# Patient Record
Sex: Female | Born: 1935 | Race: Black or African American | Hispanic: No
Health system: Southern US, Community
[De-identification: ages and names within clinical notes are randomized; demographics above are authoritative.]

## PROBLEM LIST (undated history)

## (undated) DIAGNOSIS — I10 Essential (primary) hypertension: Secondary | ICD-10-CM

## (undated) DIAGNOSIS — I219 Acute myocardial infarction, unspecified: Secondary | ICD-10-CM

## (undated) DIAGNOSIS — F0280 Dementia in other diseases classified elsewhere without behavioral disturbance: Secondary | ICD-10-CM

## (undated) DIAGNOSIS — E119 Type 2 diabetes mellitus without complications: Secondary | ICD-10-CM

## (undated) DIAGNOSIS — R42 Dizziness and giddiness: Secondary | ICD-10-CM

## (undated) HISTORY — PX: VEIN LIGATION: SHX2652

## (undated) HISTORY — PX: ABDOMINAL HYSTERECTOMY: SHX81

---

## 2004-10-08 ENCOUNTER — Ambulatory Visit: Payer: Self-pay | Admitting: Family Medicine

## 2006-01-05 ENCOUNTER — Ambulatory Visit: Payer: Self-pay | Admitting: Family Medicine

## 2007-06-21 ENCOUNTER — Ambulatory Visit: Payer: Self-pay | Admitting: Family Medicine

## 2008-07-16 ENCOUNTER — Ambulatory Visit: Payer: Self-pay | Admitting: Family Medicine

## 2009-07-23 ENCOUNTER — Ambulatory Visit: Payer: Self-pay | Admitting: Family Medicine

## 2011-01-26 ENCOUNTER — Ambulatory Visit: Payer: Self-pay | Admitting: Family Medicine

## 2011-08-29 ENCOUNTER — Emergency Department: Payer: Self-pay | Admitting: Internal Medicine

## 2011-08-31 ENCOUNTER — Ambulatory Visit: Payer: Self-pay | Admitting: Family Medicine

## 2012-02-23 ENCOUNTER — Ambulatory Visit: Payer: Self-pay | Admitting: Family Medicine

## 2013-07-17 ENCOUNTER — Ambulatory Visit: Payer: Self-pay | Admitting: Family Medicine

## 2013-12-31 ENCOUNTER — Emergency Department: Payer: Self-pay | Admitting: Emergency Medicine

## 2013-12-31 LAB — BASIC METABOLIC PANEL
Anion Gap: 5 — ABNORMAL LOW (ref 7–16)
BUN: 19 mg/dL — ABNORMAL HIGH (ref 7–18)
CREATININE: 1.27 mg/dL (ref 0.60–1.30)
Calcium, Total: 9.2 mg/dL (ref 8.5–10.1)
Chloride: 99 mmol/L (ref 98–107)
Co2: 32 mmol/L (ref 21–32)
EGFR (African American): 47 — ABNORMAL LOW
GFR CALC NON AF AMER: 40 — AB
Glucose: 201 mg/dL — ABNORMAL HIGH (ref 65–99)
Osmolality: 280 (ref 275–301)
POTASSIUM: 2.9 mmol/L — AB (ref 3.5–5.1)
Sodium: 136 mmol/L (ref 136–145)

## 2013-12-31 LAB — CBC WITH DIFFERENTIAL/PLATELET
BASOS PCT: 0.4 %
Basophil #: 0 10*3/uL (ref 0.0–0.1)
EOS PCT: 0.8 %
Eosinophil #: 0.1 10*3/uL (ref 0.0–0.7)
HCT: 35.4 % (ref 35.0–47.0)
HGB: 11.2 g/dL — ABNORMAL LOW (ref 12.0–16.0)
LYMPHS PCT: 29.5 %
Lymphocyte #: 2.1 10*3/uL (ref 1.0–3.6)
MCH: 25.9 pg — ABNORMAL LOW (ref 26.0–34.0)
MCHC: 31.8 g/dL — AB (ref 32.0–36.0)
MCV: 81 fL (ref 80–100)
MONO ABS: 0.4 x10 3/mm (ref 0.2–0.9)
MONOS PCT: 5 %
NEUTROS ABS: 4.7 10*3/uL (ref 1.4–6.5)
NEUTROS PCT: 64.3 %
PLATELETS: 225 10*3/uL (ref 150–440)
RBC: 4.34 10*6/uL (ref 3.80–5.20)
RDW: 13.7 % (ref 11.5–14.5)
WBC: 7.3 10*3/uL (ref 3.6–11.0)

## 2013-12-31 LAB — URINALYSIS, COMPLETE
Bilirubin,UR: NEGATIVE
Blood: NEGATIVE
Glucose,UR: NEGATIVE mg/dL (ref 0–75)
KETONE: NEGATIVE
Nitrite: NEGATIVE
Ph: 5 (ref 4.5–8.0)
Protein: NEGATIVE
Specific Gravity: 1.01 (ref 1.003–1.030)
Squamous Epithelial: 2

## 2013-12-31 LAB — TROPONIN I: Troponin-I: <0.02 ng/mL

## 2014-07-18 ENCOUNTER — Ambulatory Visit: Payer: Self-pay | Admitting: Family Medicine

## 2014-12-03 ENCOUNTER — Emergency Department: Payer: Self-pay | Admitting: Emergency Medicine

## 2016-07-30 ENCOUNTER — Other Ambulatory Visit: Payer: Self-pay | Admitting: Family Medicine

## 2016-07-30 DIAGNOSIS — Z1231 Encounter for screening mammogram for malignant neoplasm of breast: Secondary | ICD-10-CM

## 2016-09-03 ENCOUNTER — Encounter: Payer: Self-pay | Admitting: Radiology

## 2016-09-03 ENCOUNTER — Ambulatory Visit
Admission: RE | Admit: 2016-09-03 | Discharge: 2016-09-03 | Disposition: A | Discharge status: Home / Self Care | Payer: Medicare Other | Source: Ambulatory Visit | Attending: Family Medicine | Admitting: Family Medicine

## 2016-09-03 DIAGNOSIS — Z1231 Encounter for screening mammogram for malignant neoplasm of breast: Secondary | ICD-10-CM | POA: Diagnosis not present

## 2016-12-12 ENCOUNTER — Emergency Department
Admission: EM | Admit: 2016-12-12 | Discharge: 2016-12-12 | Disposition: A | Discharge status: Home / Self Care | Payer: Medicare Other | Attending: Emergency Medicine | Admitting: Emergency Medicine

## 2016-12-12 DIAGNOSIS — R197 Diarrhea, unspecified: Secondary | ICD-10-CM | POA: Insufficient documentation

## 2016-12-12 DIAGNOSIS — E119 Type 2 diabetes mellitus without complications: Secondary | ICD-10-CM | POA: Insufficient documentation

## 2016-12-12 DIAGNOSIS — L600 Ingrowing nail: Secondary | ICD-10-CM | POA: Insufficient documentation

## 2016-12-12 DIAGNOSIS — R112 Nausea with vomiting, unspecified: Secondary | ICD-10-CM | POA: Diagnosis not present

## 2016-12-12 DIAGNOSIS — I10 Essential (primary) hypertension: Secondary | ICD-10-CM | POA: Insufficient documentation

## 2016-12-12 DIAGNOSIS — Z7982 Long term (current) use of aspirin: Secondary | ICD-10-CM | POA: Diagnosis not present

## 2016-12-12 DIAGNOSIS — Z79899 Other long term (current) drug therapy: Secondary | ICD-10-CM | POA: Insufficient documentation

## 2016-12-12 DIAGNOSIS — R109 Unspecified abdominal pain: Secondary | ICD-10-CM | POA: Diagnosis not present

## 2016-12-12 DIAGNOSIS — Z7984 Long term (current) use of oral hypoglycemic drugs: Secondary | ICD-10-CM | POA: Insufficient documentation

## 2016-12-12 HISTORY — DX: Type 2 diabetes mellitus without complications: E11.9

## 2016-12-12 HISTORY — DX: Essential (primary) hypertension: I10

## 2016-12-12 LAB — COMPREHENSIVE METABOLIC PANEL
ALK PHOS: 56 U/L (ref 38–126)
ALT: 18 U/L (ref 14–54)
AST: 30 U/L (ref 15–41)
Albumin: 4 g/dL (ref 3.5–5.0)
Anion gap: 7 (ref 5–15)
BUN: 14 mg/dL (ref 6–20)
CHLORIDE: 107 mmol/L (ref 101–111)
CO2: 24 mmol/L (ref 22–32)
Calcium: 9.5 mg/dL (ref 8.9–10.3)
Creatinine, Ser: 1.1 mg/dL — ABNORMAL HIGH (ref 0.44–1.00)
GFR calc non Af Amer: 46 mL/min — ABNORMAL LOW (ref >60)
GFR, EST AFRICAN AMERICAN: 53 mL/min — AB (ref >60)
Glucose, Bld: 168 mg/dL — ABNORMAL HIGH (ref 65–99)
Potassium: 4.5 mmol/L (ref 3.5–5.1)
SODIUM: 138 mmol/L (ref 135–145)
TOTAL PROTEIN: 7.2 g/dL (ref 6.5–8.1)
Total Bilirubin: 0.7 mg/dL (ref 0.3–1.2)

## 2016-12-12 LAB — CBC
HEMATOCRIT: 38.2 % (ref 35.0–47.0)
HEMOGLOBIN: 12.9 g/dL (ref 12.0–16.0)
MCH: 27.2 pg (ref 26.0–34.0)
MCHC: 33.7 g/dL (ref 32.0–36.0)
MCV: 80.7 fL (ref 80.0–100.0)
Platelets: 208 10*3/uL (ref 150–440)
RBC: 4.73 MIL/uL (ref 3.80–5.20)
RDW: 14.1 % (ref 11.5–14.5)
WBC: 8.4 10*3/uL (ref 3.6–11.0)

## 2016-12-12 LAB — LIPASE, BLOOD: Lipase: 17 U/L (ref 11–51)

## 2016-12-12 LAB — TROPONIN I: TROPONIN I: 0.05 ng/mL — AB (ref <0.03)

## 2016-12-12 MED ORDER — ONDANSETRON HCL 4 MG/2ML IJ SOLN
4.0000 mg | Freq: Once | INTRAMUSCULAR | Status: AC | PRN
  Administered 2016-12-12: 4 mg via INTRAVENOUS

## 2016-12-12 MED ORDER — CIPROFLOXACIN HCL 500 MG PO TABS
750.0000 mg | ORAL_TABLET | Freq: Once | ORAL | Status: AC
  Administered 2016-12-12: 750 mg via ORAL
  Filled 2016-12-12: qty 1.5

## 2016-12-12 MED ORDER — SODIUM CHLORIDE 0.9 % IV BOLUS (SEPSIS)
1000.0000 mL | Freq: Once | INTRAVENOUS | Status: AC
  Administered 2016-12-12: 1000 mL via INTRAVENOUS

## 2016-12-12 MED ORDER — ONDANSETRON HCL 4 MG/2ML IJ SOLN
4.0000 mg | Freq: Once | INTRAMUSCULAR | Status: DC
  Filled 2016-12-12: qty 2

## 2016-12-12 MED ORDER — LOPERAMIDE HCL 2 MG PO CAPS
2.0000 mg | ORAL_CAPSULE | Freq: Once | ORAL | Status: AC
  Administered 2016-12-12: 2 mg via ORAL
  Filled 2016-12-12: qty 1

## 2016-12-12 MED ORDER — ONDANSETRON HCL 4 MG/2ML IJ SOLN
INTRAMUSCULAR | Status: AC
  Filled 2016-12-12: qty 2

## 2016-12-12 MED ORDER — ONDANSETRON 4 MG PO TBDP: 4.0000 mg | ORAL_TABLET | Freq: Three times a day (TID) | ORAL | 0 refills | Status: DC | PRN

## 2016-12-12 NOTE — ED Triage Notes (Signed)
Pt presents via EMS c/o N/V/D starting this am. Pt reports buying plate of food yesterday that may be causing s/s. Denies abd pain.

## 2016-12-12 NOTE — ED Provider Notes (Signed)
[facility] Emergency Department Provider Note  ____________________________________________   First MD Initiated Contact with Patient 12/12/16 [phone]     (approximate)  I have reviewed the triage vital signs and the nursing notes.   HISTORY  Chief Complaint Emesis    HPI Connie Frederick is a 83 y.o. female who comes to the emergency department with nausea vomiting and diarrhea that began about 3 hours after eating chitlins for dinner last night. She denies chest pain or shortness of breath. She denies fevers or chills. She has been able to eat or drink. She feels somewhat dehydrated. She is also concerned about ingrown toenails on both feet and some muscle cramps to have the back of her left leg.   Past Medical History:  Diagnosis Date  . Diabetes mellitus without complication (HCC)   . Hypertension     There are no active problems to display for this patient.   Past Surgical History:  Procedure Laterality Date  . ABDOMINAL HYSTERECTOMY      Prior to Admission medications   Medication Sig Start Date End Date Taking? Authorizing Provider  amLODipine (NORVASC) 2.5 MG tablet Take 2.5 mg by mouth daily. 08/06/15  Yes Historical Provider, MD  aspirin EC 81 MG tablet Take 81 mg by mouth daily.   Yes Historical Provider, MD  gabapentin (NEURONTIN) 400 MG capsule Take 400 mg by mouth daily. 10/14/15  Yes Historical Provider, MD  glipiZIDE (GLUCOTROL XL) 2.5 MG 24 hr tablet Take 2.5 mg by mouth daily. 08/25/15  Yes Historical Provider, MD  losartan (COZAAR) 100 MG tablet Take 100 mg by mouth daily. 08/11/15  Yes Historical Provider, MD  polyethylene glycol powder (GLYCOLAX/MIRALAX) powder Take 17 g by mouth daily. 10/14/15  Yes Historical Provider, MD  pravastatin (PRAVACHOL) 80 MG tablet Take 80 mg by mouth daily. 08/29/15  Yes Historical Provider, MD  Pregabalin (LYRICA PO) Take 1 capsule by mouth 3 (three) times daily.   Yes Historical Provider, MD    ondansetron (ZOFRAN ODT) 4 MG disintegrating tablet Take 1 tablet (4 mg total) by mouth every 8 (eight) hours as needed for nausea or vomiting. 12/12/16   Merrily BrittleNeil Yulian Gosney, MD    Allergies Patient has no known allergies.  History reviewed. No pertinent family history.  Social History Social History  Substance Use Topics  . Smoking status: Never Smoker  . Smokeless tobacco: Never Used  . Alcohol use No    Review of Systems Constitutional: No fever/chills Eyes: No visual changes. ENT: No sore throat. Cardiovascular: Denies chest pain. Respiratory: Denies shortness of breath. Gastrointestinal: Positive abdominal pain.  Positive nausea, positive vomiting.  Positive diarrhea.  No constipation. Genitourinary: Negative for dysuria. Musculoskeletal: Negative for back pain. Skin: Negative for rash. Neurological: Negative for headaches, focal weakness or numbness.  10-point ROS otherwise negative.  ____________________________________________   PHYSICAL EXAM:  VITAL SIGNS: ED Triage Vitals [12/12/16 0807]  Enc Vitals Group     BP      Pulse      Resp      Temp      Temp src      SpO2      Weight 180 lb (81.6 kg)     Height 5' (1.524 m)     Head Circumference      Peak Flow      Pain Score      Pain Loc      Pain Edu?      Excl. in GC?  Constitutional: Alert and oriented x 4 well appearing nontoxic no diaphoresis speaks in full, clear sentences Eyes: PERRL EOMI. Head: Atraumatic. Nose: No congestion/rhinnorhea. Mouth/Throat: No trismus Neck: No stridor.   Cardiovascular: Normal rate, regular rhythm. Grossly normal heart sounds.  Good peripheral circulation. Respiratory: Normal respiratory effort.  No retractions. Lungs CTAB and moving good air Gastrointestinal: Soft nondistended nontender no rebound no guarding no peritonitis no McBurney's tenderness negative Rovsing's no costovertebral tenderness negative Murphy's Musculoskeletal:Popliteal fossa on the left  feels like a Baker cyst Neurologic:  Normal speech and language. No gross focal neurologic deficits are appreciated. Skin:  Skin is warm, dry and intact. No rash noted. Psychiatric: Mood and affect are normal. Speech and behavior are normal.    ____________________________________________   DIFFERENTIAL  Viral gastroenteritis, food poisoning, Baker's cyst, ingrown toenails ____________________________________________   LABS (all labs ordered are listed, but only abnormal results are displayed)  Labs Reviewed  COMPREHENSIVE METABOLIC PANEL - Abnormal; Notable for the following:       Result Value   Glucose, Bld 168 (*)    Creatinine, Ser 1.10 (*)    GFR calc non Af Amer 46 (*)    GFR calc Af Amer 53 (*)    All other components within normal limits  TROPONIN I - Abnormal; Notable for the following:    Troponin I 0.05 (*)    All other components within normal limits  CBC  LIPASE, BLOOD  URINALYSIS, COMPLETE (UACMP) WITH MICROSCOPIC    Slightly elevated troponin with decreased renal function __________________________________________  EKG  ED ECG REPORT I, Merrily Brittle, the attending physician, personally viewed and interpreted this ECG.  Date: 12/12/2016 Rate: 90 Rhythm: normal sinus rhythm QRS Axis: Leftward Intervals: normal ST/T Wave abnormalities: normal Conduction Disturbances: none Narrative Interpretation: unremarkable  ____________________________________________  RADIOLOGY   ____________________________________________   PROCEDURES  Procedure(s) performed: no  Procedures  Critical Care performed: no  ____________________________________________   INITIAL IMPRESSION / ASSESSMENT AND PLAN / ED COURSE  Pertinent labs & imaging results that were available during my care of the patient were reviewed by me and considered in my medical decision making (see chart for details).  The patient has nausea vomiting diarrhea shortly after eating  chitlins. This is likely either viral gastroenteritis, bacterial gastroenteritis, food poisoning. Fluids antiemetics and a single dose of ciprofloxacin now and I will reevaluate.     ----------------------------------------- 11:43 AM on 12/12/2016 -----------------------------------------  I appreciate the patient's slightly elevated troponin, however this is likely secondary to dehydration and demand that she has no chest pain and no anginal equivalents. She is clear and nausea and diarrhea 2-3 hours after having chitlins. She feels improved after oral hydration and Zofran. She is discharged home in improved in good condition. ____________________________________________   FINAL CLINICAL IMPRESSION(S) / ED DIAGNOSES  Final diagnoses:  Nausea vomiting and diarrhea  Ingrown toenail      NEW MEDICATIONS STARTED DURING THIS VISIT:  New Prescriptions   ONDANSETRON (ZOFRAN ODT) 4 MG DISINTEGRATING TABLET    Take 1 tablet (4 mg total) by mouth every 8 (eight) hours as needed for nausea or vomiting.     Note:  This document was prepared using Dragon voice recognition software and may include unintentional dictation errors.     Merrily Brittle, MD 12/12/16 [phone]

## 2016-12-12 NOTE — Discharge Instructions (Addendum)
Please make sure you remain well-hydrated and take Zofran as needed for nausea. Return to the emergency department for any new or worsening symptoms such as if you cannot eat or drink, your diarrhea worsens, or for any other concerns. Please schedule an appointment to see her podiatrist within 1 week for recheck.  It was a pleasure to take care of you today, and thank you for coming to our emergency department.  If you have any questions or concerns before leaving please ask the nurse to grab me and I'm more than happy to go through your aftercare instructions again.  If you were prescribed any opioid pain medication today such as Norco, Vicodin, Percocet, morphine, hydrocodone, or oxycodone please make sure you do not drive when you are taking this medication as it can alter your ability to drive safely.  If you have any concerns once you are home that you are not improving or are in fact getting worse before you can make it to your follow-up appointment, please do not hesitate to call 911 and come back for further evaluation.  Merrily BrittleNeil Warnie Belair MD  Results for orders placed or performed during the hospital encounter of 12/12/16  Comprehensive metabolic panel  Result Value Ref Range   Sodium 138 135 - 145 mmol/L   Potassium 4.5 3.5 - 5.1 mmol/L   Chloride 107 101 - 111 mmol/L   CO2 24 22 - 32 mmol/L   Glucose, Bld 168 (H) 65 - 99 mg/dL   BUN 14 6 - 20 mg/dL   Creatinine, Ser 4.091.10 (H) 0.44 - 1.00 mg/dL   Calcium 9.5 8.9 - 81.110.3 mg/dL   Total Protein 7.2 6.5 - 8.1 g/dL   Albumin 4.0 3.5 - 5.0 g/dL   AST 30 15 - 41 U/L   ALT 18 14 - 54 U/L   Alkaline Phosphatase 56 38 - 126 U/L   Total Bilirubin 0.7 0.3 - 1.2 mg/dL   GFR calc non Af Amer 46 (L) >60 mL/min   GFR calc Af Amer 53 (L) >60 mL/min   Anion gap 7 5 - 15  CBC  Result Value Ref Range   WBC 8.4 3.6 - 11.0 K/uL   RBC 4.73 3.80 - 5.20 MIL/uL   Hemoglobin 12.9 12.0 - 16.0 g/dL   HCT 91.438.2 78.235.0 - 95.647.0 %   MCV 80.7 80.0 - 100.0 fL   MCH 27.2 26.0 - 34.0 pg   MCHC 33.7 32.0 - 36.0 g/dL   RDW 21.314.1 08.611.5 - 57.814.5 %   Platelets 208 150 - 440 K/uL  Lipase, blood  Result Value Ref Range   Lipase 17 11 - 51 U/L  Troponin I  Result Value Ref Range   Troponin I 0.05 (HH) <0.03 ng/mL

## 2017-02-22 ENCOUNTER — Ambulatory Visit (INDEPENDENT_AMBULATORY_CARE_PROVIDER_SITE_OTHER): Payer: Medicare Other | Admitting: Vascular Surgery

## 2017-02-22 ENCOUNTER — Encounter (INDEPENDENT_AMBULATORY_CARE_PROVIDER_SITE_OTHER): Payer: Self-pay | Admitting: Vascular Surgery

## 2017-02-22 VITALS — BP 155/84 | HR 94 | Resp 17 | Ht 60.0 in | Wt 186.0 lb

## 2017-02-22 DIAGNOSIS — E118 Type 2 diabetes mellitus with unspecified complications: Secondary | ICD-10-CM

## 2017-02-22 DIAGNOSIS — M79606 Pain in leg, unspecified: Secondary | ICD-10-CM | POA: Diagnosis not present

## 2017-02-22 DIAGNOSIS — E785 Hyperlipidemia, unspecified: Secondary | ICD-10-CM | POA: Diagnosis not present

## 2017-02-22 DIAGNOSIS — E119 Type 2 diabetes mellitus without complications: Secondary | ICD-10-CM | POA: Insufficient documentation

## 2017-02-22 DIAGNOSIS — M7989 Other specified soft tissue disorders: Secondary | ICD-10-CM

## 2017-02-22 NOTE — Progress Notes (Signed)
Subjective:    Patient ID: Connie Frederick, female    DOB: 23-Mar-1933, 83 y.o.   MRN: [id number] Chief Complaint  Patient presents with  . New Evaluation    Bilateral leg swelling   Presents as a new patient referred by Dr. Allena KatzPatel. Patient presents with a chief complaint of bilateral lower extremity pain and swelling. The patient reports a 7-8 month progressive worsening in the discomfort associated with her lower extremity. The patient is experiencing a numbness and heaviness in her lower extremity. States the symptoms worsen throughout the day and tend to be worse at night. Patient also experiences cramping in her bilateral with activity. Patient states she experiences swelling mostly located in her ankle and feet which worsens throughout the day as well. The patient states the symptoms of her right leg are worse than her left. The patient denies any surgery or trauma to the lower extremity. The patient denies any wound formation to her lower extremity. The patient does not wear compression stockings aren't due to an elevation at this time. Patient denies any fever or nausea or vomiting.   Review of Systems  Constitutional: Negative.   HENT: Negative.   Eyes: Negative.   Respiratory: Negative.   Cardiovascular: Positive for leg swelling.  Gastrointestinal: Negative.   Endocrine: Negative.   Genitourinary: Negative.   Musculoskeletal: Negative.   Skin: Negative.   Allergic/Immunologic: Negative.   Neurological: Negative.   Hematological: Negative.   Psychiatric/Behavioral: Negative.       Objective:   Physical Exam  Constitutional: She appears well-developed and well-nourished. No distress.  HENT:  Head: Normocephalic and atraumatic.  Eyes: Conjunctivae are normal. Pupils are equal, round, and reactive to light.  Neck: Normal range of motion.  Cardiovascular: Normal rate, regular rhythm, normal heart sounds and intact distal pulses.   Pulses:      Radial pulses are 2+ on the  right side, and 2+ on the left side.  Hard to appreciate pedal pulses due to edema however the patient's bilateral feet are warm  Pulmonary/Chest: Effort normal.  Musculoskeletal: Normal range of motion. She exhibits edema (Moderate bilateral edema localized to the patient's feet and ankles. 1+ pitting edema).  Neurological: She is alert.  Skin: Skin is warm and dry. She is not diaphoretic.  Psychiatric: She has a normal mood and affect. Her behavior is normal. Judgment and thought content normal.  Vitals reviewed.   BP (!) 155/84 (BP Location: Right Arm)   Pulse 94   Resp 17   Ht 5' (1.524 m)   Wt 186 lb (84.4 kg)   BMI 36.33 kg/m   Past Medical History:  Diagnosis Date  . Diabetes mellitus without complication (HCC)   . Hypertension     Social History   Social History  . Marital status: Widowed    Spouse name: N/A  . Number of children: N/A  . Years of education: N/A   Occupational History  . Not on file.   Social History Main Topics  . Smoking status: Never Smoker  . Smokeless tobacco: Never Used  . Alcohol use No  . Drug use: Unknown  . Sexual activity: Not on file   Other Topics Concern  . Not on file   Social History Narrative  . No narrative on file    Past Surgical History:  Procedure Laterality Date  . ABDOMINAL HYSTERECTOMY      No family history on file.  No Known Allergies     Assessment & Plan:  Presents as a new patient referred by Dr. Allena Katz. Patient presents with a chief complaint of bilateral lower extremity pain and swelling. The patient reports a 7-8 month progressive worsening in the discomfort associated with her lower extremity. The patient is experiencing a numbness and heaviness in her lower extremity. States the symptoms worsen throughout the day and tend to be worse at night. Patient also experiences cramping in her bilateral with activity. Patient states she experiences swelling mostly located in her ankle and feet which worsens  throughout the day as well. The patient states the symptoms of her right leg are worse than her left. The patient denies any surgery or trauma to the lower extremity. The patient denies any wound formation to her lower extremity. The patient does not wear compression stockings aren't due to an elevation at this time. Patient denies any fever or nausea or vomiting.  1. Pain and swelling of lower extremity, unspecified laterality - New Patient with multiple risk factors for peripheral artery disease. Will order an ABI to rule out any contributing PAD. Patient with symptoms consistent with venous disease. Will order a bilateral lower extremity venous duplex to rule out reflux. The patient was encouraged to wear graduated compression stockings (20-30 mmHg) on a daily basis. The patient was instructed to begin wearing the stockings first thing in the morning and removing them in the evening. The patient was instructed specifically not to sleep in the stockings. Prescription given. In addition, behavioral modification including elevation during the day will be initiated. Anti-inflammatories for pain. I have discussed with the patient at length the risk factors for and pathogenesis of atherosclerotic disease and encouraged a healthy diet, regular exercise regimen and blood pressure / glucose control.  The patient was encouraged to call the office in the interim if he experiences any claudication like symptoms, rest pain or ulcers to his feet / toes.  - VAS [place] ABI WITH/WO TBI; Future - VAS [place] LOWER EXTREMITY VENOUS REFLUX; Future  2. Hyperlipidemia, unspecified hyperlipidemia type - stable Encouraged good control as its slows the progression of atherosclerotic disease  3. Type 2 diabetes mellitus with complication, without long-term current use of insulin (HCC) - stable Encouraged good control as its slows the progression of atherosclerotic disease  Current Outpatient Prescriptions on File Prior to  Visit  Medication Sig Dispense Refill  . amLODipine (NORVASC) 2.5 MG tablet Take 2.5 mg by mouth daily.    Marland Kitchen aspirin EC 81 MG tablet Take 81 mg by mouth daily.    Marland Kitchen gabapentin (NEURONTIN) 400 MG capsule Take 400 mg by mouth daily.    Marland Kitchen glipiZIDE (GLUCOTROL XL) 2.5 MG 24 hr tablet Take 2.5 mg by mouth daily.    Marland Kitchen losartan (COZAAR) 100 MG tablet Take 100 mg by mouth daily.    . ondansetron (ZOFRAN ODT) 4 MG disintegrating tablet Take 1 tablet (4 mg total) by mouth every 8 (eight) hours as needed for nausea or vomiting. 20 tablet 0  . polyethylene glycol powder (GLYCOLAX/MIRALAX) powder Take 17 g by mouth daily.    . pravastatin (PRAVACHOL) 80 MG tablet Take 80 mg by mouth daily.    . Pregabalin (LYRICA PO) Take 1 capsule by mouth 3 (three) times daily.     No current facility-administered medications on file prior to visit.     There are no Patient Instructions on file for this visit. No Follow-up on file.   Elizer Bostic A Brieanna Nau, PA-C

## 2017-03-29 ENCOUNTER — Encounter (INDEPENDENT_AMBULATORY_CARE_PROVIDER_SITE_OTHER): Payer: Medicare Other

## 2017-03-29 ENCOUNTER — Ambulatory Visit (INDEPENDENT_AMBULATORY_CARE_PROVIDER_SITE_OTHER): Payer: Medicare Other | Admitting: Vascular Surgery

## 2017-03-29 ENCOUNTER — Ambulatory Visit (INDEPENDENT_AMBULATORY_CARE_PROVIDER_SITE_OTHER): Payer: Medicare Other

## 2017-03-29 ENCOUNTER — Encounter (INDEPENDENT_AMBULATORY_CARE_PROVIDER_SITE_OTHER): Payer: Self-pay | Admitting: Vascular Surgery

## 2017-03-29 VITALS — BP 158/86 | HR 73 | Resp 16 | Wt 187.0 lb

## 2017-03-29 DIAGNOSIS — I83813 Varicose veins of bilateral lower extremities with pain: Secondary | ICD-10-CM | POA: Diagnosis not present

## 2017-03-29 DIAGNOSIS — M79606 Pain in leg, unspecified: Secondary | ICD-10-CM

## 2017-03-29 DIAGNOSIS — E118 Type 2 diabetes mellitus with unspecified complications: Secondary | ICD-10-CM

## 2017-03-29 DIAGNOSIS — E785 Hyperlipidemia, unspecified: Secondary | ICD-10-CM | POA: Diagnosis not present

## 2017-03-29 DIAGNOSIS — M7989 Other specified soft tissue disorders: Secondary | ICD-10-CM | POA: Diagnosis not present

## 2017-03-29 NOTE — Progress Notes (Signed)
MRN : [id number]  Connie Frederick is a 83 y.o. (06-29-1933) female who presents with chief complaint of  Chief Complaint  Patient presents with  . Follow-up  .  History of Present Illness: Patient returns today in follow up of leg pain and swelling.  Compression stockings and elevation have provided no benefit and she has been using these since being prescribed by her primary care physician prior to her original visit here. Since her last visit, she has had no improvement. She denies fevers or chills. Her noninvasive studies today showed no DVT or superficial thrombophlebitis, but bilateral great saphenous vein reflux was identified.  Current Outpatient Prescriptions  Medication Sig Dispense Refill  . amLODipine (NORVASC) 2.5 MG tablet Take 2.5 mg by mouth daily.    Marland Kitchen. aspirin EC 81 MG tablet Take 81 mg by mouth daily.    Marland Kitchen. gabapentin (NEURONTIN) 400 MG capsule Take 400 mg by mouth daily.    Marland Kitchen. glipiZIDE (GLUCOTROL XL) 2.5 MG 24 hr tablet Take 2.5 mg by mouth daily.    Marland Kitchen. losartan (COZAAR) 100 MG tablet Take 100 mg by mouth daily.    . meclizine (ANTIVERT) 25 MG tablet     . ondansetron (ZOFRAN ODT) 4 MG disintegrating tablet Take 1 tablet (4 mg total) by mouth every 8 (eight) hours as needed for nausea or vomiting. 20 tablet 0  . polyethylene glycol powder (GLYCOLAX/MIRALAX) powder Take 17 g by mouth daily.    . pravastatin (PRAVACHOL) 80 MG tablet Take 80 mg by mouth daily.    . Pregabalin (LYRICA PO) Take 1 capsule by mouth 3 (three) times daily.    Marland Kitchen. rOPINIRole (REQUIP) 1 MG tablet      No current facility-administered medications for this visit.     Past Medical History:  Diagnosis Date  . Diabetes mellitus without complication (HCC)   . Hypertension     Past Surgical History:  Procedure Laterality Date  . ABDOMINAL HYSTERECTOMY      Social History Social History  Substance Use Topics  . Smoking status: Never Smoker  . Smokeless tobacco: Never Used  . Alcohol use No    widowed  Family History NO bleeding or clotting disorders  No Known Allergies   REVIEW OF SYSTEMS (Negative unless checked)  Constitutional: [] Weight loss  [] Fever  [] Chills Cardiac: [] Chest pain   [] Chest pressure   [] Palpitations   [] Shortness of breath when laying flat   [] Shortness of breath at rest   [] Shortness of breath with exertion. Vascular:  [] Pain in legs with walking   [] Pain in legs at rest   [] Pain in legs when laying flat   [] Claudication   [] Pain in feet when walking  [] Pain in feet at rest  [] Pain in feet when laying flat   [] History of DVT   [] Phlebitis   [x] Swelling in legs   [x] Varicose veins   [] Non-healing ulcers Pulmonary:   [] Uses home oxygen   [] Productive cough   [] Hemoptysis   [] Wheeze  [] COPD   [] Asthma Neurologic:  [] Dizziness  [] Blackouts   [] Seizures   [] History of stroke   [] History of TIA  [] Aphasia   [] Temporary blindness   [] Dysphagia   [] Weakness or numbness in arms   [] Weakness or numbness in legs Musculoskeletal:  [x] Arthritis   [] Joint swelling   [] Joint pain   [] Low back pain Hematologic:  [] Easy bruising  [] Easy bleeding   [] Hypercoagulable state   [] Anemic   Gastrointestinal:  [] Blood in stool   [] Vomiting  blood  [] Gastroesophageal reflux/heartburn   [] Abdominal pain Genitourinary:  [] Chronic kidney disease   [] Difficult urination  [] Frequent urination  [] Burning with urination   [] Hematuria Skin:  [] Rashes   [] Ulcers   [] Wounds Psychological:  [] History of anxiety   []  History of major depression.  Physical Examination  BP (!) 158/86   Pulse 73   Resp 16   Wt 187 lb (84.8 kg)   BMI 36.52 kg/m  Gen:  WD/WN, NAD Head: [place]/AT, No temporalis wasting. Ear/Nose/Throat: Hearing grossly intact, nares w/o erythema or drainage, trachea midline Eyes: Conjunctiva clear. Sclera non-icteric Neck: Supple.  No JVD.  Pulmonary:  Good air movement, no use of accessory muscles.  Cardiac: RRR, normal S1, S2 Vascular:  Vessel Right Left  Radial Palpable  Palpable                                    Musculoskeletal: M/S 5/5 throughout.  No deformity or atrophy. 1-2+ bilateral lower extremity edema. Neurologic: Sensation grossly intact in extremities.  Symmetrical.  Speech is fluent.  Psychiatric: Judgment intact, Mood & affect appropriate for pt's clinical situation. Dermatologic: No rashes or ulcers noted.  No cellulitis or open wounds.       Labs No results found for this or any previous visit (from the past 2160 hour(s)).  Radiology No results found.    Assessment/Plan  Diabetes (HCC) blood glucose control important in reducing the progression of atherosclerotic disease. Also, involved in wound healing. On appropriate medications.   Hyperlipidemia lipid control important in reducing the progression of atherosclerotic disease. Continue statin therapy   Varicose veins of leg with pain, bilateral Her noninvasive studies today showed no DVT or superficial thrombophlebitis, but bilateral great saphenous vein reflux was identified. We had a long talk today about the options for treatment of venous insufficiency her anatomy would be suitable for laser ablation of both great saphenous veins. She should continue to wear compression stockings and elevate her legs. I discussed the risks and benefits of laser ablation the patient desires to proceed.  Swelling of limb Her noninvasive studies today showed no DVT or superficial thrombophlebitis, but bilateral great saphenous vein reflux was identified. With this finding, venous insufficiency is likely a major contributing factor not the primary cause of her lower extremity swelling.    Festus Barren, MD  03/29/2017 5:20 PM    This note was created with Dragon medical transcription system.  Any errors from dictation are purely unintentional

## 2017-03-29 NOTE — Patient Instructions (Signed)
Endovenous Ablation Endovenous ablation is a procedure that seals off an abnormally enlarged leg vein (varicose vein). This procedure uses heat from radiofrequency waves or a laser to close off the affected vein. This procedure may be done if the vein is causing pain, swelling, sores on the skin (ulcers), or skin discoloration. Tell a health care provider about:  Any allergies you have.  All medicines you are taking, including vitamins, herbs, eye drops, creams, and over-the-counter medicines.  Any problems you or family members have had with anesthetic medicines.  Any blood disorders you have.  Any surgeries you have had.  Any medical conditions you have.  Whether you are pregnant or may be pregnant. What are the risks? Generally, this is a safe procedure. However, problems may occur, including:  Infection.  Bleeding.  Allergic reactions to medicines.  Damage to other structures.  Numbness or tingling along the leg. This is uncommon, and it is usually temporary.  Vein swelling. This is usually temporary.  Blood clots that form in a deep vein of the leg (deep vein thrombosis, or DVT) and can travel to the lungs (pulmonary embolism, or PE). This is very rare.  What happens before the procedure?  You may have blood tests to make sure that your blood can clot normally.  Ask your health care provider about: ? Changing or stopping your regular medicines. This is especially important if you are taking diabetes medicines or blood thinners. ? Taking medicines such as aspirin and ibuprofen. These medicines can thin your blood. Do not take these medicines before your procedure if your health care provider instructs you not to.  Follow instructions from your health care provider about eating or drinking restrictions.  Plan to have someone take you home after the procedure. What happens during the procedure?  You will lie on an exam table.  To reduce your risk of  infection: ? Your health care team will wash or sanitize their hands. ? Your skin will be washed with soap.  Hair may be removed from the surgical area.  Your health care provider will use an imaging tool that uses sound waves (ultrasonogram) to show images of your leg veins.  You will be given medicine to numb the area (local anesthetic).  A small cut (incision) will be made near the area that will be treated. A narrow tube (catheter) will be slipped through the incision and into the vein.  Small sensors (electrodes or laser fibers) will be passed through the catheter and into the vein.  Radiofrequency or laser energy will be sent through the sensors to burn the vein. This seals off the vein.  The electrodes, laser fibers, and catheter will be removed from the vein.  A bandage (dressing) will be placed over the incision. The procedure may vary among health care providers and hospitals. What happens after the procedure?  You may have to wear compression stockings. These stockings help to prevent blood clots and reduce swelling in your legs.  You will be encouraged to walk around immediately after the procedure. This information is not intended to replace advice given to you by your health care provider. Make sure you discuss any questions you have with your health care provider. Document Released: 08/26/2011 Document Revised: 02/12/2016 Document Reviewed: 12/10/2014 Elsevier Interactive Patient Education  2018 [employer] Inc[employer]  

## 2017-03-29 NOTE — Assessment & Plan Note (Signed)
lipid control important in reducing the progression of atherosclerotic disease. Continue statin therapy  

## 2017-03-29 NOTE — Assessment & Plan Note (Signed)
blood glucose control important in reducing the progression of atherosclerotic disease. Also, involved in wound healing. On appropriate medications.  

## 2017-03-29 NOTE — Assessment & Plan Note (Signed)
Her noninvasive studies today showed no DVT or superficial thrombophlebitis, but bilateral great saphenous vein reflux was identified. With this finding, venous insufficiency is likely a major contributing factor not the primary cause of her lower extremity swelling.

## 2017-03-29 NOTE — Assessment & Plan Note (Signed)
Her noninvasive studies today showed no DVT or superficial thrombophlebitis, but bilateral great saphenous vein reflux was identified. We had a long talk today about the options for treatment of venous insufficiency her anatomy would be suitable for laser ablation of both great saphenous veins. She should continue to wear compression stockings and elevate her legs. I discussed the risks and benefits of laser ablation the patient desires to proceed.

## 2017-04-18 ENCOUNTER — Telehealth (INDEPENDENT_AMBULATORY_CARE_PROVIDER_SITE_OTHER): Payer: Self-pay | Admitting: Vascular Surgery

## 2017-04-18 NOTE — Telephone Encounter (Signed)
Connie Frederick CALLED AND SAID THAT THE DR (PCP) FAXED OVER THE PAPER STATING SHE HAS WORN THE COMPRESSION SOCKS SINCE JAN 2018 WONDERING IF YOU GOT THE PAPER

## 2017-04-19 NOTE — Telephone Encounter (Signed)
WE RECEIVED THE PAPERWORK. SEE BELOW.

## 2017-05-28 ENCOUNTER — Emergency Department
Admission: EM | Admit: 2017-05-28 | Discharge: 2017-05-28 | Disposition: A | Discharge status: Home / Self Care | Payer: Medicare Other | Attending: Emergency Medicine | Admitting: Emergency Medicine

## 2017-05-28 ENCOUNTER — Emergency Department: Payer: Medicare Other

## 2017-05-28 DIAGNOSIS — I10 Essential (primary) hypertension: Secondary | ICD-10-CM | POA: Diagnosis not present

## 2017-05-28 DIAGNOSIS — Z7984 Long term (current) use of oral hypoglycemic drugs: Secondary | ICD-10-CM | POA: Insufficient documentation

## 2017-05-28 DIAGNOSIS — E119 Type 2 diabetes mellitus without complications: Secondary | ICD-10-CM | POA: Insufficient documentation

## 2017-05-28 DIAGNOSIS — M25562 Pain in left knee: Secondary | ICD-10-CM

## 2017-05-28 DIAGNOSIS — Z7982 Long term (current) use of aspirin: Secondary | ICD-10-CM | POA: Insufficient documentation

## 2017-05-28 DIAGNOSIS — M1712 Unilateral primary osteoarthritis, left knee: Secondary | ICD-10-CM | POA: Insufficient documentation

## 2017-05-28 DIAGNOSIS — Z79899 Other long term (current) drug therapy: Secondary | ICD-10-CM | POA: Diagnosis not present

## 2017-05-28 NOTE — Discharge Instructions (Signed)
Please use walker to help with ambulation. Rest ice and elevate the knee.Take Tylenol as needed for pain. Follow-up orthopedics if no improvement in one week.

## 2017-05-28 NOTE — ED Triage Notes (Signed)
Per EMS pt was at home and was walking in her bedroom.  She turned the corner and heard her knee "pop".  Pt has 10/10 pain and is A&Ox4.

## 2017-05-28 NOTE — ED Provider Notes (Signed)
[facility]-EMERGENCY DEPARTMENT Provider Note   CSN: [id number] Arrival date & time: 05/28/17  1905     History   Chief Complaint Chief Complaint  Patient presents with  . Knee Pain    HPI Connie Frederick is a 83 y.o. female presents to the emergency department for evaluation of left knee pain. Patient states she was in her bedroom, twisted and felt a pop in her left knee. She has continued to be ambulatory but is having moderate pain to the left knee. She has not taken any medications for pain. Pain is minimal to absent while lying in the bed. She is able to ambulatewith no assistive devices but has a mild lip. She denies any history of knee pain. She has no hip or back pain. She denies falling or injuring other parts of her body.  HPI  Past Medical History:  Diagnosis Date  . Diabetes mellitus without complication (HCC)   . Hypertension     Patient Active Problem List   Diagnosis Date Noted  . Varicose veins of leg with pain, bilateral 03/29/2017  . Swelling of limb 03/29/2017  . Hyperlipidemia 02/22/2017  . Diabetes (HCC) 02/22/2017  . Pain and swelling of lower extremity 02/22/2017    Past Surgical History:  Procedure Laterality Date  . ABDOMINAL HYSTERECTOMY      OB History    No data available       Home Medications    Prior to Admission medications   Medication Sig Start Date End Date Taking? Authorizing Provider  amLODipine (NORVASC) 2.5 MG tablet Take 2.5 mg by mouth daily. 08/06/15   [provider]  aspirin EC 81 MG tablet Take 81 mg by mouth daily.    [provider]  gabapentin (NEURONTIN) 400 MG capsule Take 400 mg by mouth daily. 10/14/15   [provider]  glipiZIDE (GLUCOTROL XL) 2.5 MG 24 hr tablet Take 2.5 mg by mouth daily. 08/25/15   [provider]  losartan (COZAAR) 100 MG tablet Take 100 mg by mouth daily. 08/11/15   [provider]  meclizine (ANTIVERT) 25 MG tablet  12/29/16   [provider]   ondansetron (ZOFRAN ODT) 4 MG disintegrating tablet Take 1 tablet (4 mg total) by mouth every 8 (eight) hours as needed for nausea or vomiting. 12/12/16   Merrily Brittleifenbark, Neil, MD  polyethylene glycol powder (GLYCOLAX/MIRALAX) powder Take 17 g by mouth daily. 10/14/15   [provider]  pravastatin (PRAVACHOL) 80 MG tablet Take 80 mg by mouth daily. 08/29/15   [provider]  Pregabalin (LYRICA PO) Take 1 capsule by mouth 3 (three) times daily.    [provider]  rOPINIRole (REQUIP) 1 MG tablet  12/08/16   [provider]    Family History History reviewed. No pertinent family history.  Social History Social History  Substance Use Topics  . Smoking status: Never Smoker  . Smokeless tobacco: Never Used  . Alcohol use No     Allergies   Patient has no known allergies.   Review of Systems Review of Systems  Constitutional: Negative for activity change.  Eyes: Negative for pain and visual disturbance.  Respiratory: Negative for shortness of breath.   Cardiovascular: Negative for chest pain and leg swelling.  Gastrointestinal: Negative for abdominal pain.  Genitourinary: Negative for flank pain and pelvic pain.  Musculoskeletal: Positive for arthralgias. Negative for gait problem, joint swelling, myalgias, neck pain and neck stiffness.  Skin: Negative for wound.  Neurological: Negative for dizziness,  syncope, weakness, light-headedness, numbness and headaches.  Psychiatric/Behavioral: Negative for confusion and decreased concentration.     Physical Exam Updated Vital Signs BP (!) 157/59 (BP Location: Right Arm)   Pulse (!) 103   Temp 98.3 F (36.8 C) (Oral)   Resp 18   Ht 5' (1.524 m)   Wt 79.4 kg (175 lb)   SpO2 97%   BMI 34.18 kg/m   Physical Exam  Constitutional: She is oriented to person, place, and time. She appears well-developed and well-nourished.  HENT:  Head: Normocephalic and atraumatic.  Right Ear: External ear normal.    Left Ear: External ear normal.  Nose: Nose normal.  Eyes: Pupils are equal, round, and reactive to light. Conjunctivae and EOM are normal.  Neck: Normal range of motion.  Cardiovascular: Normal rate.   Pulmonary/Chest: Effort normal and breath sounds normal. No respiratory distress.  Musculoskeletal:  Examination of the left lower extremity shows patient has full range of motion of the hip with no discomfort. Patient has no swelling or erythema or effusion of the knee. There is no edema throughout the left lower extremity. Patient is able straight leg raise. She has no laxity with valgus or rash stress testing of the left knee. Range of motion is 0-90 with mild discomfort.  Neurological: She is alert and oriented to person, place, and time. No cranial nerve deficit. Coordination normal.  Skin: Skin is warm and dry. No rash noted.  Psychiatric: She has a normal mood and affect. Her behavior is normal.     ED Treatments / Results  Labs (all labs ordered are listed, but only abnormal results are displayed) Labs Reviewed - No data to display  EKG  EKG Interpretation None       Radiology Dg Knee Complete 4 Views Left  Result Date: 05/28/2017 CLINICAL DATA:  Left knee pain after hearing a pop while walking in her bedroom. EXAM: LEFT KNEE - COMPLETE 4+ VIEW COMPARISON:  None. FINDINGS: No fracture or subluxation. Mild tricompartmental osteoarthritis with peripheral spurring as well as spurring of the tibial spines. Medial and lateral chondrocalcinosis. Small quadriceps tendon enthesophyte. Trace joint effusion. IMPRESSION: Mild tricompartmental osteoarthritis with chondrocalcinosis. No acute osseous abnormality. Trace joint effusion. Electronically Signed   By: Rubye Oaks M.D.   On: 05/28/2017 19:41    Procedures Procedures (including critical care time)  Medications Ordered in ED Medications - No data to display   Initial Impression / Assessment and Plan / ED Course  I have  reviewed the triage vital signs and the nursing notes.  Pertinent labs & imaging results that were available during my care of the patient were reviewed by me and considered in my medical decision making (see chart for details).    83 year old female with tricompartmental osteoarthritis and chondrocalcinosis. No evidence of acute bony abnormality. noligaments laxity.She has mild effusion. She is currently ambulatory with no assistive devices. We'll give a walker to help with ambulation. Recommend she rest ice and elevate the left knee. Will take Tylenol as needed for pain. She will follow-up with orthopedics.  Final Clinical Impressions(s) / ED Diagnoses   Final diagnoses:  Primary osteoarthritis of left knee  Acute pain of left knee    New Prescriptions New Prescriptions   No medications on file     Ronnette Juniper 05/28/17 2026    Jeanmarie Plant, MD 05/29/17 [phone]

## 2017-05-28 NOTE — ED Notes (Signed)

## 2017-05-31 ENCOUNTER — Encounter (INDEPENDENT_AMBULATORY_CARE_PROVIDER_SITE_OTHER): Payer: Self-pay | Admitting: Vascular Surgery

## 2017-05-31 ENCOUNTER — Ambulatory Visit (INDEPENDENT_AMBULATORY_CARE_PROVIDER_SITE_OTHER): Payer: Medicare Other | Admitting: Vascular Surgery

## 2017-05-31 ENCOUNTER — Encounter (INDEPENDENT_AMBULATORY_CARE_PROVIDER_SITE_OTHER): Payer: Self-pay

## 2017-05-31 VITALS — BP 140/75 | HR 78 | Resp 15 | Ht 60.0 in | Wt 183.0 lb

## 2017-05-31 DIAGNOSIS — I83813 Varicose veins of bilateral lower extremities with pain: Secondary | ICD-10-CM

## 2017-05-31 NOTE — Progress Notes (Signed)
Patient presented today for laser ablation of the left great saphenous vein. On evaluation with ultrasound, the vein was very small and was unable to be accessed with a micropuncture needle and gain wire access. After about 45 minutes of attempting to access the vein, this was clearly going to be a futile endeavor and I elected to terminate the procedure.

## 2017-06-03 ENCOUNTER — Encounter (INDEPENDENT_AMBULATORY_CARE_PROVIDER_SITE_OTHER): Payer: Medicare Other

## 2017-06-14 ENCOUNTER — Encounter (INDEPENDENT_AMBULATORY_CARE_PROVIDER_SITE_OTHER): Payer: Self-pay | Admitting: Vascular Surgery

## 2017-06-14 ENCOUNTER — Ambulatory Visit (INDEPENDENT_AMBULATORY_CARE_PROVIDER_SITE_OTHER): Payer: Medicare Other | Admitting: Vascular Surgery

## 2017-06-14 ENCOUNTER — Other Ambulatory Visit (INDEPENDENT_AMBULATORY_CARE_PROVIDER_SITE_OTHER): Payer: Self-pay | Admitting: Vascular Surgery

## 2017-06-14 VITALS — BP 139/70 | HR 76 | Resp 16 | Ht 61.0 in | Wt 185.0 lb

## 2017-06-14 DIAGNOSIS — I872 Venous insufficiency (chronic) (peripheral): Secondary | ICD-10-CM

## 2017-06-14 DIAGNOSIS — I83812 Varicose veins of left lower extremities with pain: Secondary | ICD-10-CM

## 2017-06-14 DIAGNOSIS — I83813 Varicose veins of bilateral lower extremities with pain: Secondary | ICD-10-CM

## 2017-06-14 DIAGNOSIS — I83811 Varicose veins of right lower extremities with pain: Secondary | ICD-10-CM

## 2017-06-14 DIAGNOSIS — I83892 Varicose veins of left lower extremities with other complications: Secondary | ICD-10-CM

## 2017-06-14 NOTE — Progress Notes (Signed)
Varicose veins of leg with pain, bilateral     The patient's right lower extremity was sterilely prepped and draped. The ultrasound machine was used to visualize the saphenous vein throughout its course. A segment in the upper calf was selected for access. The saphenous vein was accessed without difficulty using ultrasound guidance with a micro puncture needle. A micro puncture wire and sheath were then placed. A 0.018 wire was placed beyond the saphenofemoral junction through the sheath and the micro puncture sheath was removed. The 65 cm sheath was then placed over the wire and the wire and dilator were removed. The laser fiber was placed through the sheath and its tip was placed approximately 4-5 cm below the saphenofemoral junction. Tumescent anesthesia was then created with a dilute lidocaine solution. Laser energy was then delivered with constant withdrawal of the sheath and laser fiber. Approximately 1288 Joules of energy were delivered over a length of 33 cm using the 1470 Hz VenaCure machine at [employer]. Sterile dressings were placed. The patient tolerated the procedure well without complications.

## 2017-06-17 ENCOUNTER — Ambulatory Visit (INDEPENDENT_AMBULATORY_CARE_PROVIDER_SITE_OTHER): Payer: Medicare Other

## 2017-06-17 ENCOUNTER — Other Ambulatory Visit (INDEPENDENT_AMBULATORY_CARE_PROVIDER_SITE_OTHER): Payer: Self-pay | Admitting: Vascular Surgery

## 2017-06-17 DIAGNOSIS — M7989 Other specified soft tissue disorders: Secondary | ICD-10-CM

## 2017-06-17 DIAGNOSIS — I83813 Varicose veins of bilateral lower extremities with pain: Secondary | ICD-10-CM | POA: Diagnosis not present

## 2017-06-17 DIAGNOSIS — M79606 Pain in leg, unspecified: Secondary | ICD-10-CM

## 2017-06-17 DIAGNOSIS — I83891 Varicose veins of right lower extremities with other complications: Secondary | ICD-10-CM

## 2017-06-17 DIAGNOSIS — I872 Venous insufficiency (chronic) (peripheral): Secondary | ICD-10-CM

## 2017-06-17 DIAGNOSIS — I83892 Varicose veins of left lower extremities with other complications: Secondary | ICD-10-CM

## 2017-07-08 ENCOUNTER — Encounter (INDEPENDENT_AMBULATORY_CARE_PROVIDER_SITE_OTHER): Payer: Self-pay | Admitting: Vascular Surgery

## 2017-07-08 ENCOUNTER — Ambulatory Visit (INDEPENDENT_AMBULATORY_CARE_PROVIDER_SITE_OTHER): Payer: Medicare Other | Admitting: Vascular Surgery

## 2017-07-08 VITALS — BP 151/80 | HR 64 | Resp 15 | Ht 62.0 in | Wt 180.0 lb

## 2017-07-08 DIAGNOSIS — E785 Hyperlipidemia, unspecified: Secondary | ICD-10-CM

## 2017-07-08 DIAGNOSIS — M7989 Other specified soft tissue disorders: Secondary | ICD-10-CM

## 2017-07-08 DIAGNOSIS — E118 Type 2 diabetes mellitus with unspecified complications: Secondary | ICD-10-CM | POA: Diagnosis not present

## 2017-07-08 DIAGNOSIS — M79606 Pain in leg, unspecified: Secondary | ICD-10-CM

## 2017-07-08 DIAGNOSIS — I83813 Varicose veins of bilateral lower extremities with pain: Secondary | ICD-10-CM

## 2017-07-08 NOTE — Patient Instructions (Signed)
Varicose Vein Surgery, Care After Refer to this sheet in the next few weeks. These instructions provide you with information about caring for yourself after your procedure. Your health care provider may also give you more specific instructions. Your treatment has been planned according to current medical practices, but problems sometimes occur. Call your health care provider if you have any problems or questions after your procedure. What can I expect after the procedure? After your procedure, it is typical to have the following:  Swelling.  Bruising.  Soreness.  Mild skin discoloration.  Slight bleeding at incision sites.  Follow these instructions at home:  Take medicines only as directed by your health care provider.  Wear compression stockings as directed by your health care provider. These stockings help to prevent blood clots and reduce swelling in your legs.  There are many different ways to close and cover an incision, including stitches (sutures), skin glue, and adhesive strips. Follow your health care provider's instructions on: ? Incision care. ? Bandage (dressing) changes and removal. ? Incision closure removal.  Wear loose-fitting clothing.  Get regular daily exercise. Walk or ride a stationary bike daily or as directed by your health care provider.  Ask your health care provider when you can return to work. This may depend on the type of work you do.  Be patient with your recovery. It can take up to 4 weeks to get back to your usual activities. Contact a health care provider if:  You have a fever.  You have drainage, redness, swelling, or pain at an incision site.  You develop a cough. Get help right away if:  You pass out.  You have very bad pain in your leg.  You have leg pain that gets worse when you walk.  You have redness or swelling in your leg that is getting worse.  You have trouble breathing.  You cough up blood. This information is not  intended to replace advice given to you by your health care provider. Make sure you discuss any questions you have with your health care provider. Document Released: 05/10/2014 Document Revised: 02/12/2016 Document Reviewed: 02/13/2014 Elsevier Interactive Patient Education  2018 Elsevier Inc.  

## 2017-07-08 NOTE — Progress Notes (Signed)
MRN : [id number]  Connie Frederick is a 83 y.o. (19-Dec-1932) female who presents with chief complaint of  Chief Complaint  Patient presents with  . Follow-up    3-4 weeks post laser  .  History of Present Illness: Patient returns today in follow up of venous insufficiency and leg pain.  She had a successful right great saphenous vein laser ablation and her right leg is feeling much better with marked improvement in the pain and swelling.  She is still complaining of significant left leg pain, and that we were unable to access her small great saphenous vein at the time of her attempted left leg laser ablation.  Current Outpatient Prescriptions  Medication Sig Dispense Refill  . amLODipine (NORVASC) 2.5 MG tablet Take 2.5 mg by mouth daily.    Marland Kitchen. aspirin EC 81 MG tablet Take 81 mg by mouth daily.    Marland Kitchen. gabapentin (NEURONTIN) 400 MG capsule Take 400 mg by mouth daily.    Marland Kitchen. glipiZIDE (GLUCOTROL XL) 2.5 MG 24 hr tablet Take 2.5 mg by mouth daily.    Marland Kitchen. losartan (COZAAR) 100 MG tablet Take 100 mg by mouth daily.    . meclizine (ANTIVERT) 25 MG tablet     . ondansetron (ZOFRAN ODT) 4 MG disintegrating tablet Take 1 tablet (4 mg total) by mouth every 8 (eight) hours as needed for nausea or vomiting. 20 tablet 0  . polyethylene glycol powder (GLYCOLAX/MIRALAX) powder Take 17 g by mouth daily.    . pravastatin (PRAVACHOL) 80 MG tablet Take 80 mg by mouth daily.    . Pregabalin (LYRICA PO) Take 1 capsule by mouth 3 (three) times daily.    Marland Kitchen. rOPINIRole (REQUIP) 1 MG tablet      No current facility-administered medications for this visit.     Past Medical History:  Diagnosis Date  . Diabetes mellitus without complication (HCC)   . Hypertension     Past Surgical History:  Procedure Laterality Date  . ABDOMINAL HYSTERECTOMY      Social History     Social History  Substance Use Topics  . Smoking status: Never Smoker  . Smokeless tobacco: Never Used  . Alcohol use No  widowed  Family  History NO bleeding or clotting disorders  No Known Allergies   REVIEW OF SYSTEMS (Negative unless checked)  Constitutional: [] Weight loss  [] Fever  [] Chills Cardiac: [] Chest pain   [] Chest pressure   [] Palpitations   [] Shortness of breath when laying flat   [] Shortness of breath at rest   [] Shortness of breath with exertion. Vascular:  [] Pain in legs with walking   [] Pain in legs at rest   [] Pain in legs when laying flat   [] Claudication   [] Pain in feet when walking  [] Pain in feet at rest  [] Pain in feet when laying flat   [] History of DVT   [] Phlebitis   [x] Swelling in legs   [x] Varicose veins   [] Non-healing ulcers Pulmonary:   [] Uses home oxygen   [] Productive cough   [] Hemoptysis   [] Wheeze  [] COPD   [] Asthma Neurologic:  [] Dizziness  [] Blackouts   [] Seizures   [] History of stroke   [] History of TIA  [] Aphasia   [] Temporary blindness   [] Dysphagia   [] Weakness or numbness in arms   [] Weakness or numbness in legs Musculoskeletal:  [x] Arthritis   [] Joint swelling   [] Joint pain   [] Low back pain Hematologic:  [] Easy bruising  [] Easy bleeding   [] Hypercoagulable state   [] Anemic  Gastrointestinal:  [] Blood in stool   [] Vomiting blood  [] Gastroesophageal reflux/heartburn   [] Abdominal pain Genitourinary:  [] Chronic kidney disease   [] Difficult urination  [] Frequent urination  [] Burning with urination   [] Hematuria Skin:  [] Rashes   [] Ulcers   [] Wounds Psychological:  [] History of anxiety   []  History of major depression.    Physical Examination  BP (!) 151/80 (BP Location: Right Arm)   Pulse 64   Resp 15   Ht 5\' 2"  (1.575 m)   Wt 81.6 kg (180 lb)   BMI 32.92 kg/m  Gen:  WD/WN, NAD.  Appears younger than stated age Head: [place]/AT, No temporalis wasting. Ear/Nose/Throat: Hearing grossly intact, nares w/o erythema or drainage, trachea midline Eyes: Conjunctiva clear. Sclera non-icteric Neck: Supple.  No JVD.  Pulmonary:  Good air movement, no use of accessory muscles.    Cardiac: RRR, normal S1, S2 Vascular:  Vessel Right Left  Radial Palpable Palpable                          PT Palpable Palpable  DP Palpable Palpable   Musculoskeletal: M/S 5/5 throughout.  No deformity or atrophy.  1+ bilateral lower extremity edema. Neurologic: Sensation grossly intact in extremities.  Symmetrical.  Speech is fluent.  Psychiatric: Judgment intact, Mood & affect appropriate for pt's clinical situation. Dermatologic: No rashes or ulcers noted.  No cellulitis or open wounds. Lymph : No Cervical, Axillary, or Inguinal lymphadenopathy.      Labs No results found for this or any previous visit (from the past 2160 hour(s)).  Radiology No results found.    Assessment/Plan Diabetes (HCC) blood glucose control important in reducing the progression of atherosclerotic disease. Also, involved in wound healing. On appropriate medications.   Hyperlipidemia lipid control important in reducing the progression of atherosclerotic disease. Continue statin therapy  Pain and swelling of lower extremity Right leg improved after laser ablation.  Left great saphenous vein remains untreated.  We are going to repeat another attempt at treating her left great saphenous vein with laser ablation to try to improve her symptoms.  Varicose veins of leg with pain, bilateral Right leg improved after laser ablation.  Left great saphenous vein remains untreated.  We are going to repeat another attempt at treating her left great saphenous vein with laser ablation to try to improve her symptoms.    Festus Barren, MD  07/08/2017 11:30 AM    This note was created with Dragon medical transcription system.  Any errors from dictation are purely unintentional

## 2017-07-08 NOTE — Assessment & Plan Note (Signed)
Right leg improved after laser ablation.  Left great saphenous vein remains untreated.  We are going to repeat another attempt at treating her left great saphenous vein with laser ablation to try to improve her symptoms. 

## 2017-07-08 NOTE — Assessment & Plan Note (Signed)
Right leg improved after laser ablation.  Left great saphenous vein remains untreated.  We are going to repeat another attempt at treating her left great saphenous vein with laser ablation to try to improve her symptoms.

## 2017-09-02 ENCOUNTER — Encounter (INDEPENDENT_AMBULATORY_CARE_PROVIDER_SITE_OTHER): Payer: Self-pay | Admitting: Vascular Surgery

## 2017-09-02 ENCOUNTER — Ambulatory Visit (INDEPENDENT_AMBULATORY_CARE_PROVIDER_SITE_OTHER): Payer: Medicare Other | Admitting: Vascular Surgery

## 2017-09-02 VITALS — BP 122/64 | HR 82 | Resp 16 | Wt 181.0 lb

## 2017-09-02 DIAGNOSIS — I83813 Varicose veins of bilateral lower extremities with pain: Secondary | ICD-10-CM | POA: Diagnosis not present

## 2017-09-02 NOTE — Progress Notes (Signed)
Varicose veins of leg with pain, bilateral     The patient's left lower extremity was sterilely prepped and draped. The ultrasound machine was used to visualize the saphenous vein throughout its course. A segment in the mid calf was selected for access. The saphenous vein was accessed with a mild amount of difficulty using ultrasound guidance with a micro puncture needle. A micro puncture wire and sheath were then placed. A 0.018 wire was placed beyond the saphenofemoral junction through the sheath and the micro puncture sheath was removed. The 65 cm sheath was then placed over the wire and the wire and dilator were removed. The laser fiber was placed through the sheath and its tip was placed approximately 3-4 cm below the saphenofemoral junction. Tumescent anesthesia was then created with a dilute lidocaine solution. Laser energy was then delivered with constant withdrawal of the sheath and laser fiber. Approximately 1225 joules of energy were delivered over a length of 37 cm using the 1470 Hz VenaCure machine at [employer]. Sterile dressings were placed. The patient tolerated the procedure well without complications.

## 2017-09-05 ENCOUNTER — Ambulatory Visit (INDEPENDENT_AMBULATORY_CARE_PROVIDER_SITE_OTHER): Payer: Medicare Other

## 2017-09-05 DIAGNOSIS — I83813 Varicose veins of bilateral lower extremities with pain: Secondary | ICD-10-CM

## 2017-09-30 ENCOUNTER — Ambulatory Visit (INDEPENDENT_AMBULATORY_CARE_PROVIDER_SITE_OTHER): Payer: Medicare Other | Admitting: Vascular Surgery

## 2017-10-04 ENCOUNTER — Ambulatory Visit (INDEPENDENT_AMBULATORY_CARE_PROVIDER_SITE_OTHER): Payer: Medicare Other | Admitting: Vascular Surgery

## 2017-10-04 ENCOUNTER — Encounter (INDEPENDENT_AMBULATORY_CARE_PROVIDER_SITE_OTHER): Payer: Self-pay | Admitting: Vascular Surgery

## 2017-10-04 VITALS — BP 146/77 | HR 91 | Resp 16 | Wt 180.0 lb

## 2017-10-04 DIAGNOSIS — E118 Type 2 diabetes mellitus with unspecified complications: Secondary | ICD-10-CM | POA: Diagnosis not present

## 2017-10-04 DIAGNOSIS — E785 Hyperlipidemia, unspecified: Secondary | ICD-10-CM

## 2017-10-04 DIAGNOSIS — I83813 Varicose veins of bilateral lower extremities with pain: Secondary | ICD-10-CM | POA: Diagnosis not present

## 2017-10-04 NOTE — Progress Notes (Signed)
MRN : [id number]  Connie Frederick is a 84 y.o. (1933/05/18) female who presents with chief complaint of  Chief Complaint  Patient presents with  . Follow-up    3-4wk post laser  .  History of Present Illness: Patient returns today in follow up of her venous disease.  She still has some numbness in her feet but her swelling is better and her pain is a little better.  She is undergone bilateral great saphenous vein laser ablations over the past several months in a staged fashion.  Both ablations were successful without DVT.  Current Outpatient Medications  Medication Sig Dispense Refill  . amLODipine (NORVASC) 2.5 MG tablet Take 2.5 mg by mouth daily.    Marland Kitchen. aspirin EC 81 MG tablet Take 81 mg by mouth daily.    Marland Kitchen. gabapentin (NEURONTIN) 400 MG capsule Take 400 mg by mouth daily.    Marland Kitchen. glipiZIDE (GLUCOTROL XL) 2.5 MG 24 hr tablet Take 2.5 mg by mouth daily.    Marland Kitchen. losartan (COZAAR) 100 MG tablet Take 100 mg by mouth daily.    . meclizine (ANTIVERT) 25 MG tablet     . ondansetron (ZOFRAN ODT) 4 MG disintegrating tablet Take 1 tablet (4 mg total) by mouth every 8 (eight) hours as needed for nausea or vomiting. 20 tablet 0  . polyethylene glycol powder (GLYCOLAX/MIRALAX) powder Take 17 g by mouth daily.    . pravastatin (PRAVACHOL) 80 MG tablet Take 80 mg by mouth daily.    . Pregabalin (LYRICA PO) Take 1 capsule by mouth 3 (three) times daily.    Marland Kitchen. rOPINIRole (REQUIP) 1 MG tablet      No current facility-administered medications for this visit.     Past Medical History:  Diagnosis Date  . Diabetes mellitus without complication (HCC)   . Hypertension     Past Surgical History:  Procedure Laterality Date  . ABDOMINAL HYSTERECTOMY      Social History     Social History  Substance Use Topics  . Smoking status: Never Smoker  . Smokeless tobacco: Never Used  . Alcohol use No  widowed  Family History NO bleeding or clotting disorders  No Known Allergies   REVIEW OF  SYSTEMS(Negative unless checked)  Constitutional: [] Weight loss[] Fever[] Chills Cardiac:[] Chest pain[] Chest pressure[] Palpitations [] Shortness of breath when laying flat [] Shortness of breath at rest [] Shortness of breath with exertion. Vascular: [] Pain in legs with walking[] Pain in legsat rest[] Pain in legs when laying flat [] Claudication [] Pain in feet when walking [] Pain in feet at rest [] Pain in feet when laying flat [] History of DVT [] Phlebitis [x] Swelling in legs [x] Varicose veins [] Non-healing ulcers Pulmonary: [] Uses home oxygen [] Productive cough[] Hemoptysis [] Wheeze [] COPD [] Asthma Neurologic: [] Dizziness [] Blackouts [] Seizures [] History of stroke [] History of TIA[] Aphasia [] Temporary blindness[] Dysphagia [] Weaknessor numbness in arms [x] Weakness or numbnessin legs Musculoskeletal: [x] Arthritis [] Joint swelling [] Joint pain [] Low back pain Hematologic:[] Easy bruising[] Easy bleeding [] Hypercoagulable state [] Anemic  Gastrointestinal:[] Blood in stool[] Vomiting blood[] Gastroesophageal reflux/heartburn[] Abdominal pain Genitourinary: [] Chronic kidney disease [] Difficulturination [] Frequenturination [] Burning with urination[] Hematuria Skin: [] Rashes [] Ulcers [] Wounds Psychological: [] History of anxiety[] History of major depression.     Physical Examination  BP (!) 146/77 (BP Location: Right Arm)   Pulse 91   Resp 16   Wt 81.6 kg (180 lb)   BMI 32.92 kg/m  Gen:  WD/WN, NAD.  Appears younger than stated age Head: [place]/AT, No temporalis wasting. Ear/Nose/Throat: Hearing grossly intact, nares w/o erythema or drainage, trachea midline Eyes: Conjunctiva clear. Sclera non-icteric Neck: Supple.  No JVD.  Pulmonary:  Good air movement, no use  of accessory muscles.  Cardiac: RRR, normal S1, S2 Vascular:  Vessel Right Left  Radial Palpable Palpable                                     Musculoskeletal: M/S 5/5 throughout.  No deformity or atrophy.  Trace bilateral lower extremity edema. Neurologic: Sensation grossly intact in extremities.  Symmetrical.  Speech is fluent.  Psychiatric: Judgment intact, Mood & affect appropriate for pt's clinical situation. Dermatologic: No rashes or ulcers noted.  No cellulitis or open wounds.       Labs No results found for this or any previous visit (from the past 2160 hour(s)).  Radiology No results found.   Assessment/Plan Diabetes (HCC) blood glucose control important in reducing the progression of atherosclerotic disease. Also, involved in wound healing. On appropriate medications.   Hyperlipidemia lipid control important in reducing the progression of atherosclerotic disease. Continue statin therapy   Varicose veins of leg with pain, bilateral The patient is doing well status post laser ablation of both great saphenous veins.  This has improved her symptoms.  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  No further venous intervention would be of benefit.  Would recommend continue wearing compression stockings and elevating her legs.  We will see her back as needed.    Festus Barren, MD  10/05/2017 9:57 AM    This note was created with Dragon medical transcription system.  Any errors from dictation are purely unintentional

## 2017-10-05 NOTE — Assessment & Plan Note (Signed)
The patient is doing well status post laser ablation of both great saphenous veins.  This has improved her symptoms.  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  No further venous intervention would be of benefit.  Would recommend continue wearing compression stockings and elevating her legs.  We will see her back as needed.

## 2017-10-14 ENCOUNTER — Ambulatory Visit
Admission: RE | Admit: 2017-10-14 | Discharge: 2017-10-14 | Disposition: A | Discharge status: Home / Self Care | Payer: Medicare Other | Source: Ambulatory Visit | Attending: Family Medicine | Admitting: Family Medicine

## 2017-10-14 ENCOUNTER — Other Ambulatory Visit: Payer: Self-pay | Admitting: Family Medicine

## 2017-10-14 DIAGNOSIS — M7122 Synovial cyst of popliteal space [Baker], left knee: Secondary | ICD-10-CM

## 2017-10-14 DIAGNOSIS — M11262 Other chondrocalcinosis, left knee: Secondary | ICD-10-CM | POA: Diagnosis not present

## 2017-10-14 DIAGNOSIS — M1712 Unilateral primary osteoarthritis, left knee: Secondary | ICD-10-CM | POA: Insufficient documentation

## 2017-10-14 DIAGNOSIS — M25562 Pain in left knee: Secondary | ICD-10-CM | POA: Diagnosis present

## 2017-11-01 ENCOUNTER — Encounter: Payer: Self-pay | Admitting: Emergency Medicine

## 2017-11-01 ENCOUNTER — Other Ambulatory Visit: Payer: Self-pay

## 2017-11-01 ENCOUNTER — Emergency Department
Admission: EM | Admit: 2017-11-01 | Discharge: 2017-11-01 | Disposition: A | Discharge status: Home / Self Care | Payer: Medicare Other | Attending: Emergency Medicine | Admitting: Emergency Medicine

## 2017-11-01 DIAGNOSIS — E119 Type 2 diabetes mellitus without complications: Secondary | ICD-10-CM | POA: Diagnosis not present

## 2017-11-01 DIAGNOSIS — Z79899 Other long term (current) drug therapy: Secondary | ICD-10-CM | POA: Insufficient documentation

## 2017-11-01 DIAGNOSIS — R197 Diarrhea, unspecified: Secondary | ICD-10-CM | POA: Insufficient documentation

## 2017-11-01 DIAGNOSIS — E785 Hyperlipidemia, unspecified: Secondary | ICD-10-CM | POA: Insufficient documentation

## 2017-11-01 DIAGNOSIS — I10 Essential (primary) hypertension: Secondary | ICD-10-CM | POA: Insufficient documentation

## 2017-11-01 DIAGNOSIS — Z7984 Long term (current) use of oral hypoglycemic drugs: Secondary | ICD-10-CM | POA: Insufficient documentation

## 2017-11-01 LAB — CBC WITH DIFFERENTIAL/PLATELET
Basophils Absolute: 0 10*3/uL (ref 0–0.1)
Basophils Relative: 0 %
Eosinophils Absolute: 0.1 10*3/uL (ref 0–0.7)
Eosinophils Relative: 1 %
HCT: 38.5 % (ref 35.0–47.0)
Hemoglobin: 12.6 g/dL (ref 12.0–16.0)
LYMPHS ABS: 1.2 10*3/uL (ref 1.0–3.6)
LYMPHS PCT: 15 %
MCH: 26.6 pg (ref 26.0–34.0)
MCHC: 32.7 g/dL (ref 32.0–36.0)
MCV: 81.5 fL (ref 80.0–100.0)
Monocytes Absolute: 0.5 10*3/uL (ref 0.2–0.9)
Monocytes Relative: 7 %
Neutro Abs: 6 10*3/uL (ref 1.4–6.5)
Neutrophils Relative %: 77 %
PLATELETS: 199 10*3/uL (ref 150–440)
RBC: 4.73 MIL/uL (ref 3.80–5.20)
RDW: 14.7 % — ABNORMAL HIGH (ref 11.5–14.5)
WBC: 7.8 10*3/uL (ref 3.6–11.0)

## 2017-11-01 LAB — BASIC METABOLIC PANEL
Anion gap: 6 (ref 5–15)
BUN: 19 mg/dL (ref 6–20)
CHLORIDE: 105 mmol/L (ref 101–111)
CO2: 28 mmol/L (ref 22–32)
Calcium: 9.3 mg/dL (ref 8.9–10.3)
Creatinine, Ser: 1.16 mg/dL — ABNORMAL HIGH (ref 0.44–1.00)
GFR calc Af Amer: 49 mL/min — ABNORMAL LOW (ref >60)
GFR calc non Af Amer: 43 mL/min — ABNORMAL LOW (ref >60)
GLUCOSE: 125 mg/dL — AB (ref 65–99)
Potassium: 4.6 mmol/L (ref 3.5–5.1)
Sodium: 139 mmol/L (ref 135–145)

## 2017-11-01 LAB — C DIFFICILE QUICK SCREEN W PCR REFLEX
C DIFFICILE (CDIFF) INTERP: NOT DETECTED
C Diff antigen: NEGATIVE
C Diff toxin: NEGATIVE

## 2017-11-01 LAB — MAGNESIUM: Magnesium: 2 mg/dL (ref 1.7–2.4)

## 2017-11-01 MED ORDER — ONDANSETRON HCL 4 MG/2ML IJ SOLN
4.0000 mg | Freq: Once | INTRAMUSCULAR | Status: AC
  Administered 2017-11-01: 4 mg via INTRAVENOUS
  Filled 2017-11-01: qty 2

## 2017-11-01 MED ORDER — SODIUM CHLORIDE 0.9 % IV BOLUS (SEPSIS)
1000.0000 mL | Freq: Once | INTRAVENOUS | Status: AC
  Administered 2017-11-01: 1000 mL via INTRAVENOUS

## 2017-11-01 MED ORDER — ONDANSETRON 4 MG PO TBDP: 4.0000 mg | ORAL_TABLET | Freq: Three times a day (TID) | ORAL | 0 refills | Status: DC | PRN

## 2017-11-01 NOTE — ED Provider Notes (Signed)
[facility] Emergency Department Provider Note  ____________________________________________  Time seen: Approximately 9:40 AM  I have reviewed the triage vital signs and the nursing notes.   HISTORY  Chief Complaint Diarrhea and Dizziness   HPI Connie Frederick is a 84 y.o. female a history of diabetes and hypertension who presents for evaluation of diarrhea and dizziness. Patient reports that since 1 AM she has had several episodes of watery diarrhea. No blood or melena. No abdominal pain, fever or chills. She has had nausea but no vomiting. This morning when she got up she felt dizzy like she was going to pass out which prompted her to call EMS. No recent antibiotics use for history of C. difficile. She has not taken her morning meds.No CP, SOB, or URI symptoms.  Past Medical History:  Diagnosis Date  . Diabetes mellitus without complication (HCC)   . Hypertension     Patient Active Problem List   Diagnosis Date Noted  . Varicose veins of leg with pain, bilateral 03/29/2017  . Swelling of limb 03/29/2017  . Hyperlipidemia 02/22/2017  . Diabetes (HCC) 02/22/2017  . Pain and swelling of lower extremity 02/22/2017    Past Surgical History:  Procedure Laterality Date  . ABDOMINAL HYSTERECTOMY      Prior to Admission medications   Medication Sig Start Date End Date Taking? Authorizing Provider  amLODipine (NORVASC) 2.5 MG tablet Take 2.5 mg by mouth daily. 08/06/15   [provider]  aspirin EC 81 MG tablet Take 81 mg by mouth daily.    [provider]  gabapentin (NEURONTIN) 400 MG capsule Take 400 mg by mouth daily. 10/14/15   [provider]  glipiZIDE (GLUCOTROL XL) 2.5 MG 24 hr tablet Take 2.5 mg by mouth daily. 08/25/15   [provider]  losartan (COZAAR) 100 MG tablet Take 100 mg by mouth daily. 08/11/15   [provider]  meclizine (ANTIVERT) 25 MG tablet  12/29/16   [provider]    ondansetron (ZOFRAN ODT) 4 MG disintegrating tablet Take 1 tablet (4 mg total) by mouth every 8 (eight) hours as needed for nausea or vomiting. 11/01/17   Don Perking, [place], MD  polyethylene glycol powder (GLYCOLAX/MIRALAX) powder Take 17 g by mouth daily. 10/14/15   [provider]  pravastatin (PRAVACHOL) 80 MG tablet Take 80 mg by mouth daily. 08/29/15   [provider]  Pregabalin (LYRICA PO) Take 1 capsule by mouth 3 (three) times daily.    [provider]  rOPINIRole (REQUIP) 1 MG tablet  12/08/16   [provider]    Allergies Patient has no known allergies.  Family History  Problem Relation Age of Onset  . Hypertension Brother     Social History Social History   Tobacco Use  . Smoking status: Never Smoker  . Smokeless tobacco: Never Used  Substance Use Topics  . Alcohol use: No  . Drug use: No    Review of Systems  Constitutional: Negative for fever. + Lightheadedness Eyes: Negative for visual changes. ENT: Negative for sore throat. Neck: No neck pain  Cardiovascular: Negative for chest pain. Respiratory: Negative for shortness of breath. Gastrointestinal: Negative for abdominal pain, vomiting. + diarrhea. Genitourinary: Negative for dysuria. Musculoskeletal: Negative for back pain. Skin: Negative for rash. Neurological: Negative for headaches, weakness or numbness. Psych: No SI or HI  ____________________________________________   PHYSICAL EXAM:  VITAL SIGNS: ED Triage Vitals  Enc Vitals Group     BP --  Pulse Rate 11/01/17 0937 83     Resp 11/01/17 0937 10     Temp 11/01/17 0937 98.4 F (36.9 C)     Temp Source 11/01/17 0937 Oral     SpO2 11/01/17 0937 99 %     Weight 11/01/17 0938 173 lb (78.5 kg)     Height 11/01/17 0938 5' (1.524 m)     Head Circumference --      Peak Flow --      Pain Score --      Pain Loc --      Pain Edu? --      Excl. in GC? --     Constitutional: Alert and oriented. Well  appearing and in no apparent distress. HEENT:      Head: Normocephalic and atraumatic.         Eyes: Conjunctivae are normal. Sclera is non-icteric.       Mouth/Throat: Mucous membranes are moist.       Neck: Supple with no signs of meningismus. Cardiovascular: Regular rate and rhythm. No murmurs, gallops, or rubs. 2+ symmetrical distal pulses are present in all extremities. No JVD. Respiratory: Normal respiratory effort. Lungs are clear to auscultation bilaterally. No wheezes, crackles, or rhonchi.  Gastrointestinal: Soft, non tender, and non distended with positive bowel sounds. No rebound or guarding. Musculoskeletal: Nontender with normal range of motion in all extremities. No edema, cyanosis, or erythema of extremities. Neurologic: Normal speech and language. Face is symmetric. Moving all extremities. No gross focal neurologic deficits are appreciated. Skin: Skin is warm, dry and intact. No rash noted. Psychiatric: Mood and affect are normal. Speech and behavior are normal.  ____________________________________________   LABS (all labs ordered are listed, but only abnormal results are displayed)  Labs Reviewed  CBC WITH DIFFERENTIAL/PLATELET - Abnormal; Notable for the following components:      Result Value   RDW 14.7 (*)    All other components within normal limits  BASIC METABOLIC PANEL - Abnormal; Notable for the following components:   Glucose, Bld 125 (*)    Creatinine, Ser 1.16 (*)    GFR calc non Af Amer 43 (*)    GFR calc Af Amer 49 (*)    All other components within normal limits  C DIFFICILE QUICK SCREEN W PCR REFLEX  MAGNESIUM  URINALYSIS, COMPLETE (UACMP) WITH MICROSCOPIC   ____________________________________________  EKG  ED ECG REPORT I, Nita Sickle, the attending physician, personally viewed and interpreted this ECG.  Normal sinus rhythm, rate of 80, normal intervals, left axis deviation, no ST elevations or depressions.    ____________________________________________  RADIOLOGY  none  ____________________________________________   PROCEDURES  Procedure(s) performed: None Procedures Critical Care performed:  None ____________________________________________   INITIAL IMPRESSION / ASSESSMENT AND PLAN / ED COURSE  84 y.o. female a history of diabetes and hypertension who presents for evaluation of diarrhea and dizziness. Patient with several episodes of diarrhea since 1 AM and this morning feeling dizzy. Vitals are stable, is soft with no tenderness throughout. We'll send stool for C. difficile. We'll check labs to rule out dehydration and electrolyte abnormalities. EKG with no evidence of dysrhythmias or ischemia. We'll give IV fluids.  Clinical Course as of Nov 02 1247  Tue Nov 01, 2017  1247 Patient's labs are within normal limits. Vitals remained stable. Electrolytes within normal limits. C. difficile is negative. Patient at this time is stable for discharge with outpatient follow-up. She'll be provided with a prescription for Zofran. Discussed return precautions with  patient and her family members were at the bedside.  [CV]    Clinical Course User Index [CV] Don PerkingVeronese, [place], MD     As part of my medical decision making, I reviewed the following data within the electronic MEDICAL RECORD NUMBER Nursing notes reviewed and incorporated, Labs reviewed , EKG interpreted , Notes from prior ED visits and [place] Controlled Substance Database    Pertinent labs & imaging results that were available during my care of the patient were reviewed by me and considered in my medical decision making (see chart for details).    ____________________________________________   FINAL CLINICAL IMPRESSION(S) / ED DIAGNOSES  Final diagnoses:  Diarrhea of presumed infectious origin      NEW MEDICATIONS STARTED DURING THIS VISIT:  ED Discharge Orders        Ordered    ondansetron (ZOFRAN ODT) 4 MG disintegrating  tablet  Every 8 hours PRN     11/01/17 1248       Note:  This document was prepared using Dragon voice recognition software and may include unintentional dictation errors.    Don PerkingVeronese, [place], MD 11/01/17 1249

## 2017-11-01 NOTE — ED Notes (Signed)
Pt discharged home after verbalizing understanding of discharge instructions; nad noted. 

## 2017-11-01 NOTE — ED Triage Notes (Signed)
Pt via ems from home with diarrhea and dizziness today. Pt states she has had numerous episodes of diarrhea since 0100. Pt states she has not had any other symptoms and has been drinking water (drank one bottle of water this morning). Pt alert & oriented with NAD noted.

## 2018-03-15 ENCOUNTER — Emergency Department
Admission: EM | Admit: 2018-03-15 | Discharge: 2018-03-15 | Disposition: A | Discharge status: Home / Self Care | Payer: Medicare Other | Attending: Student in an Organized Health Care Education/Training Program | Admitting: Student in an Organized Health Care Education/Training Program

## 2018-03-15 ENCOUNTER — Emergency Department: Payer: Medicare Other

## 2018-03-15 ENCOUNTER — Other Ambulatory Visit: Payer: Self-pay

## 2018-03-15 DIAGNOSIS — R42 Dizziness and giddiness: Secondary | ICD-10-CM | POA: Insufficient documentation

## 2018-03-15 DIAGNOSIS — Z7982 Long term (current) use of aspirin: Secondary | ICD-10-CM | POA: Diagnosis not present

## 2018-03-15 DIAGNOSIS — E119 Type 2 diabetes mellitus without complications: Secondary | ICD-10-CM | POA: Diagnosis not present

## 2018-03-15 DIAGNOSIS — I10 Essential (primary) hypertension: Secondary | ICD-10-CM | POA: Insufficient documentation

## 2018-03-15 DIAGNOSIS — Z79899 Other long term (current) drug therapy: Secondary | ICD-10-CM | POA: Diagnosis not present

## 2018-03-15 LAB — URINALYSIS, COMPLETE (UACMP) WITH MICROSCOPIC
BACTERIA UA: NONE SEEN
BILIRUBIN URINE: NEGATIVE
Glucose, UA: NEGATIVE mg/dL
Hgb urine dipstick: NEGATIVE
Ketones, ur: NEGATIVE mg/dL
Leukocytes, UA: NEGATIVE
NITRITE: NEGATIVE
PH: 7 (ref 5.0–8.0)
PROTEIN: NEGATIVE mg/dL
SPECIFIC GRAVITY, URINE: 1.003 — AB (ref 1.005–1.030)

## 2018-03-15 LAB — COMPREHENSIVE METABOLIC PANEL
ALK PHOS: 62 U/L (ref 38–126)
ALT: 22 U/L (ref 0–44)
ANION GAP: 9 (ref 5–15)
AST: 35 U/L (ref 15–41)
Albumin: 4.1 g/dL (ref 3.5–5.0)
BILIRUBIN TOTAL: 1.1 mg/dL (ref 0.3–1.2)
BUN: 20 mg/dL (ref 8–23)
CALCIUM: 9.7 mg/dL (ref 8.9–10.3)
CO2: 25 mmol/L (ref 22–32)
CREATININE: 1.04 mg/dL — AB (ref 0.44–1.00)
Chloride: 105 mmol/L (ref 98–111)
GFR calc non Af Amer: 49 mL/min — ABNORMAL LOW (ref >60)
GFR, EST AFRICAN AMERICAN: 56 mL/min — AB (ref >60)
GLUCOSE: 119 mg/dL — AB (ref 70–99)
Potassium: 4.3 mmol/L (ref 3.5–5.1)
Sodium: 139 mmol/L (ref 135–145)
TOTAL PROTEIN: 7.4 g/dL (ref 6.5–8.1)

## 2018-03-15 LAB — CBC WITH DIFFERENTIAL/PLATELET
BASOS ABS: 0 10*3/uL (ref 0–0.1)
Basophils Relative: 1 %
EOS PCT: 1 %
Eosinophils Absolute: 0 10*3/uL (ref 0–0.7)
HEMATOCRIT: 40.4 % (ref 35.0–47.0)
Hemoglobin: 13.5 g/dL (ref 12.0–16.0)
LYMPHS PCT: 37 %
Lymphs Abs: 2 10*3/uL (ref 1.0–3.6)
MCH: 27.6 pg (ref 26.0–34.0)
MCHC: 33.5 g/dL (ref 32.0–36.0)
MCV: 82.4 fL (ref 80.0–100.0)
MONO ABS: 0.3 10*3/uL (ref 0.2–0.9)
MONOS PCT: 6 %
Neutro Abs: 3.1 10*3/uL (ref 1.4–6.5)
Neutrophils Relative %: 55 %
Platelets: 186 10*3/uL (ref 150–440)
RBC: 4.91 MIL/uL (ref 3.80–5.20)
RDW: 13.6 % (ref 11.5–14.5)
WBC: 5.5 10*3/uL (ref 3.6–11.0)

## 2018-03-15 LAB — TROPONIN I: Troponin I: <0.03 ng/mL (ref <0.03)

## 2018-03-15 MED ORDER — MECLIZINE HCL 25 MG PO TABS
12.5000 mg | ORAL_TABLET | Freq: Once | ORAL | Status: AC
  Administered 2018-03-15: 12.5 mg via ORAL
  Filled 2018-03-15: qty 1

## 2018-03-15 MED ORDER — MECLIZINE HCL 12.5 MG PO TABS: 12.5000 mg | ORAL_TABLET | Freq: Three times a day (TID) | ORAL | 0 refills | Status: DC | PRN

## 2018-03-15 NOTE — ED Provider Notes (Signed)
[facility] Emergency Department Provider Note    First MD Initiated Contact with Patient 03/15/18 [phone]     (approximate)  I have reviewed the triage vital signs and the nursing notes.   HISTORY  Chief Complaint Dizziness    HPI Connie Frederick is a 84 y.o. female the history of diabetes and hypertension presents the ER with chief complaint of dizziness that started around 9:00 this morning after she was taking shower.  Has not had anything to eat but states that she typically does not until later in the afternoon.  States that she does have a glass of water every morning.  Denies any chest pain or shortness of breath.  No numbness or tingling.  Just feels that anytime she moves her head at the room is spinning around and has never had this happen before.  Denies any history of vertigo however does have Antivert prescribed.  Denies any history of stroke or heart disease.    Past Medical History:  Diagnosis Date  . Diabetes mellitus without complication (HCC)   . Hypertension    Family History  Problem Relation Age of Onset  . Hypertension Brother    Past Surgical History:  Procedure Laterality Date  . ABDOMINAL HYSTERECTOMY     Patient Active Problem List   Diagnosis Date Noted  . Varicose veins of leg with pain, bilateral 03/29/2017  . Swelling of limb 03/29/2017  . Hyperlipidemia 02/22/2017  . Diabetes (HCC) 02/22/2017  . Pain and swelling of lower extremity 02/22/2017      Prior to Admission medications   Medication Sig Start Date End Date Taking? Authorizing Provider  amLODipine (NORVASC) 2.5 MG tablet Take 2.5 mg by mouth daily. 08/06/15   [provider]  aspirin EC 81 MG tablet Take 81 mg by mouth daily.    [provider]  gabapentin (NEURONTIN) 400 MG capsule Take 400 mg by mouth daily. 10/14/15   [provider]  glipiZIDE (GLUCOTROL XL) 2.5 MG 24 hr tablet Take 2.5 mg by mouth daily. 08/25/15   [provider]  losartan (COZAAR) 100 MG tablet Take 100 mg by mouth daily. 08/11/15   [provider]  meclizine (ANTIVERT) 12.5 MG tablet Take 1 tablet (12.5 mg total) by mouth every 8 (eight) hours as needed for dizziness. 03/15/18   Willy Eddyobinson, Brendi Mccarroll, MD  ondansetron (ZOFRAN ODT) 4 MG disintegrating tablet Take 1 tablet (4 mg total) by mouth every 8 (eight) hours as needed for nausea or vomiting. 11/01/17   Don PerkingVeronese, [place], MD  polyethylene glycol powder (GLYCOLAX/MIRALAX) powder Take 17 g by mouth daily. 10/14/15   [provider]  pravastatin (PRAVACHOL) 80 MG tablet Take 80 mg by mouth daily. 08/29/15   [provider]  Pregabalin (LYRICA PO) Take 1 capsule by mouth 3 (three) times daily.    [provider]  rOPINIRole (REQUIP) 1 MG tablet  12/08/16   [provider]    Allergies Patient has no known allergies.    Social History Social History   Tobacco Use  . Smoking status: Never Smoker  . Smokeless tobacco: Never Used  Substance Use Topics  . Alcohol use: No  . Drug use: No    Review of Systems Patient denies headaches, rhinorrhea, blurry vision, numbness, shortness of breath, chest pain, edema, cough, abdominal pain, nausea, vomiting, diarrhea, dysuria, fevers, rashes or hallucinations unless otherwise stated above in HPI. ____________________________________________   PHYSICAL EXAM:  VITAL SIGNS: Vitals:   03/15/18  1130 03/15/18 1330  BP: (!) 151/76 (!) 154/71  Pulse: 74 94  Resp: 20 18  Temp:    SpO2: 98% 100%    Constitutional: Alert and oriented.  Eyes: Conjunctivae are normal.  Head: Atraumatic. Nose: No congestion/rhinnorhea. Mouth/Throat: Mucous membranes are moist.   Neck: No stridor. Painless ROM.  Cardiovascular: Normal rate, regular rhythm. Grossly normal heart sounds.  Good peripheral circulation. Respiratory: Normal respiratory effort.  No retractions. Lungs CTAB. Gastrointestinal: Soft and  nontender. No distention. No abdominal bruits. No CVA tenderness. Genitourinary: deferred Musculoskeletal: No lower extremity tenderness nor edema.  No joint effusions. Neurologic:  CN- intact.  No facial droop, Normal FNF.  Normal heel to shin.  Sensation intact bilaterally. Normal speech and language. No gross focal neurologic deficits are appreciated. No gait instability. Skin:  Skin is warm, dry and intact. No rash noted. Psychiatric: Mood and affect are normal. Speech and behavior are normal.  ____________________________________________   LABS (all labs ordered are listed, but only abnormal results are displayed)  Results for orders placed or performed during the hospital encounter of 03/15/18 (from the past 24 hour(s))  CBC with Differential/Platelet     Status: None   Collection Time: 03/15/18 10:13 AM  Result Value Ref Range   WBC 5.5 3.6 - 11.0 K/uL   RBC 4.91 3.80 - 5.20 MIL/uL   Hemoglobin 13.5 12.0 - 16.0 g/dL   HCT 16.1 09.6 - 04.5 %   MCV 82.4 80.0 - 100.0 fL   MCH 27.6 26.0 - 34.0 pg   MCHC 33.5 32.0 - 36.0 g/dL   RDW 40.9 81.1 - 91.4 %   Platelets 186 150 - 440 K/uL   Neutrophils Relative % 55 %   Neutro Abs 3.1 1.4 - 6.5 K/uL   Lymphocytes Relative 37 %   Lymphs Abs 2.0 1.0 - 3.6 K/uL   Monocytes Relative 6 %   Monocytes Absolute 0.3 0.2 - 0.9 K/uL   Eosinophils Relative 1 %   Eosinophils Absolute 0.0 0 - 0.7 K/uL   Basophils Relative 1 %   Basophils Absolute 0.0 0 - 0.1 K/uL  Comprehensive metabolic panel     Status: Abnormal   Collection Time: 03/15/18 10:13 AM  Result Value Ref Range   Sodium 139 135 - 145 mmol/L   Potassium 4.3 3.5 - 5.1 mmol/L   Chloride 105 98 - 111 mmol/L   CO2 25 22 - 32 mmol/L   Glucose, Bld 119 (H) 70 - 99 mg/dL   BUN 20 8 - 23 mg/dL   Creatinine, Ser 7.82 (H) 0.44 - 1.00 mg/dL   Calcium 9.7 8.9 - 95.6 mg/dL   Total Protein 7.4 6.5 - 8.1 g/dL   Albumin 4.1 3.5 - 5.0 g/dL   AST 35 15 - 41 U/L   ALT 22 0 - 44 U/L   Alkaline  Phosphatase 62 38 - 126 U/L   Total Bilirubin 1.1 0.3 - 1.2 mg/dL   GFR calc non Af Amer 49 (L) >60 mL/min   GFR calc Af Amer 56 (L) >60 mL/min   Anion gap 9 5 - 15  Troponin I     Status: None   Collection Time: 03/15/18 10:13 AM  Result Value Ref Range   Troponin I <0.03 <0.03 ng/mL  Urinalysis, Complete w Microscopic     Status: Abnormal   Collection Time: 03/15/18 10:13 AM  Result Value Ref Range   Color, Urine STRAW (A) YELLOW   APPearance CLEAR (A) CLEAR  Specific Gravity, Urine 1.003 (L) 1.005 - 1.030   pH 7.0 5.0 - 8.0   Glucose, UA NEGATIVE NEGATIVE mg/dL   Hgb urine dipstick NEGATIVE NEGATIVE   Bilirubin Urine NEGATIVE NEGATIVE   Ketones, ur NEGATIVE NEGATIVE mg/dL   Protein, ur NEGATIVE NEGATIVE mg/dL   Nitrite NEGATIVE NEGATIVE   Leukocytes, UA NEGATIVE NEGATIVE   RBC / HPF 0-5 0 - 5 RBC/hpf   WBC, UA 0-5 0 - 5 WBC/hpf   Bacteria, UA NONE SEEN NONE SEEN   Squamous Epithelial / LPF 0-5 0 - 5   ____________________________________________  EKG My review and personal interpretation at Time: 10:06   Indication: dizziness  Rate: 75  Rhythm: sinus Axis: normal Other: normal intervla,s poor r wave progression, no stemi ____________________________________________  RADIOLOGY  I personally reviewed all radiographic images ordered to evaluate for the above acute complaints and reviewed radiology reports and findings.  These findings were personally discussed with the patient.  Please see medical record for radiology report.  ____________________________________________   PROCEDURES  Procedure(s) performed:  Procedures    Critical Care performed: no ____________________________________________   INITIAL IMPRESSION / ASSESSMENT AND PLAN / ED COURSE  Pertinent labs & imaging results that were available during my care of the patient were reviewed by me and considered in my medical decision making (see chart for details).   DDX: dehydration, orthostasis, acs,  cva, vertigo, anemia, uti  Connie Frederick is a 84 y.o. who presents to the ED with dizziness and vague lightheadedness as described above.  Patient currently afebrile and hemodynamically stable.  EKG does not show any evidence of dysrhythmia or bradycardia.  She does not have any chest pain or shortness of breath.  Blood work is reassuring no evidence of UTI.  There is no other metabolic evidence for pathology CT imaging will be ordered to evaluate for evidence of ischemia, mass.  Certainly possible peripheral vertigo but patient still symptomatic at this time.  I anticipate patient will require MRI of CT imaging is equivocal or negative.  Clinical Course as of Mar 15 1441  Wed Mar 15, 2018  1149 CT head shows show chronic microcytic change.  Based on her symptoms and risk factors will order MRI to further evaluate and differentiate her presentation.   [PR]  1439 Patient reassessed.  MRI shows no evidence of acute infarct.  Patient's symptoms did significant improve after meclizine.  This does appear more consistent with peripheral vertigo.  Patient stable and able to ambulate with steady gait.  Appropriate for outpatient follow-up.   [PR]    Clinical Course User Index [PR] Willy Eddy, MD     As part of my medical decision making, I reviewed the following data within the electronic MEDICAL RECORD NUMBER Nursing notes reviewed and incorporated, Labs reviewed, notes from prior ED visits.  ____________________________________________   FINAL CLINICAL IMPRESSION(S) / ED DIAGNOSES  Final diagnoses:  Dizziness      NEW MEDICATIONS STARTED DURING THIS VISIT:  Current Discharge Medication List       Note:  This document was prepared using Dragon voice recognition software and may include unintentional dictation errors.    Willy Eddy, MD 03/15/18 [phone]

## 2018-03-15 NOTE — ED Notes (Signed)
Patient transported to MRI 

## 2018-03-15 NOTE — ED Notes (Signed)
Pt ambulated with assistance to and from commode. Pt reports feeling better and not as dizzy. Pt given more crackers and juice as requested. VSS. Call bell within reach, will continue to monitor.

## 2018-03-15 NOTE — ED Notes (Signed)
Pt given crackers and diet ginger ale. Pt updated with plan of care. VSS. Pt family remains at side. Call bell within reach, will continue to monitor.

## 2018-03-15 NOTE — ED Triage Notes (Signed)
Patient to ED via [facility] from home c/o dizziness. Per EMS patient reports feeling dizzy after taking a shower, and reports dizziness has been constant throughout morning. She is alert and oriented x4, denies pain, and denies NVD. No acute distress noted.

## 2018-03-23 IMAGING — CR DG KNEE COMPLETE 4+V*L*
1 series · 4 of 4 positions shown · non-contrast
Comparison: None.

CLINICAL DATA: Left knee pain after hearing a pop while walking in
her bedroom.

EXAM:
LEFT KNEE - COMPLETE 4+ VIEW

[Series 1: dg knee complete 4 views left · 0.14mm/px · 4 of 4 slices shown]
[im 1/4]
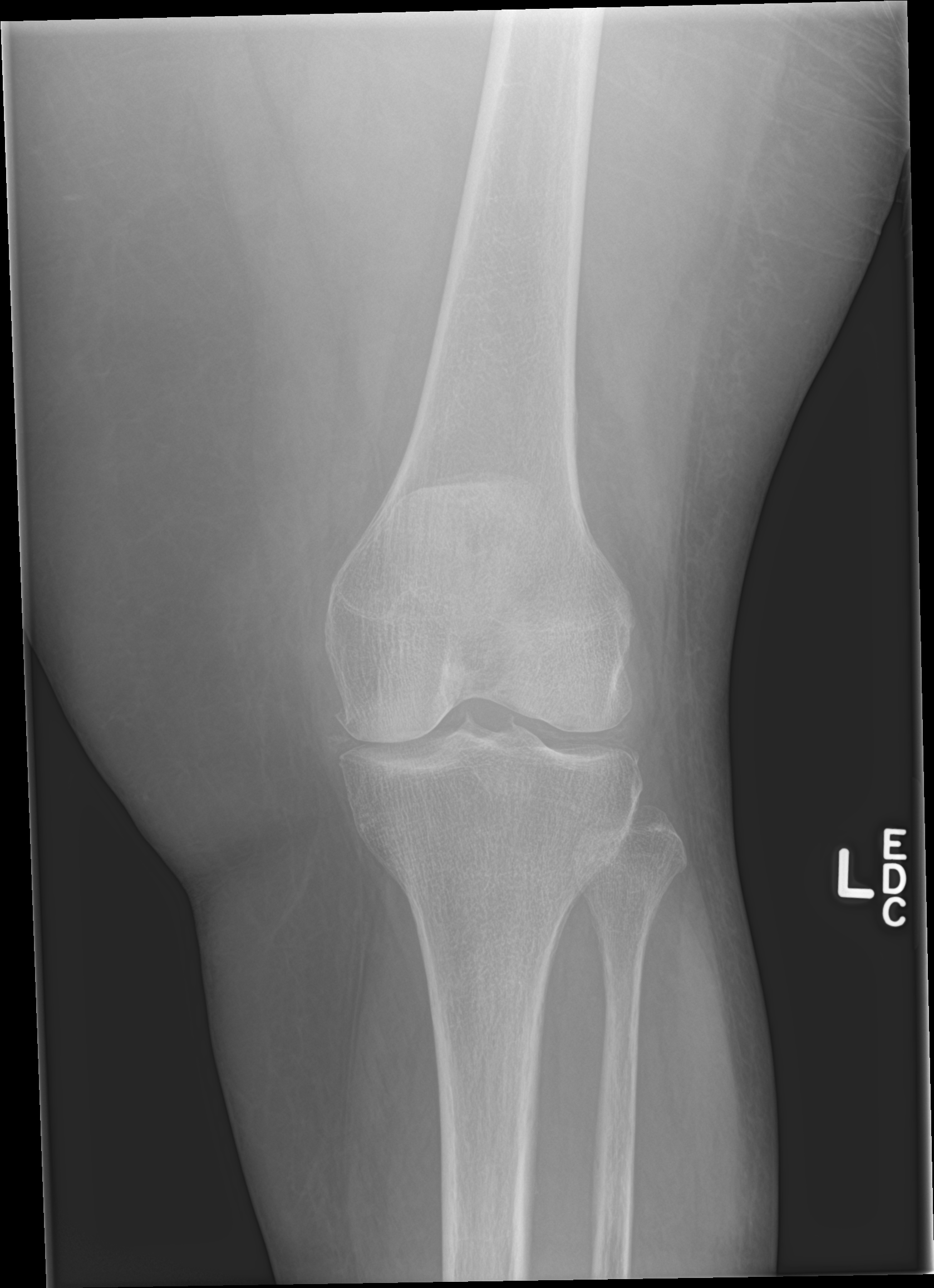
[im 2/4]
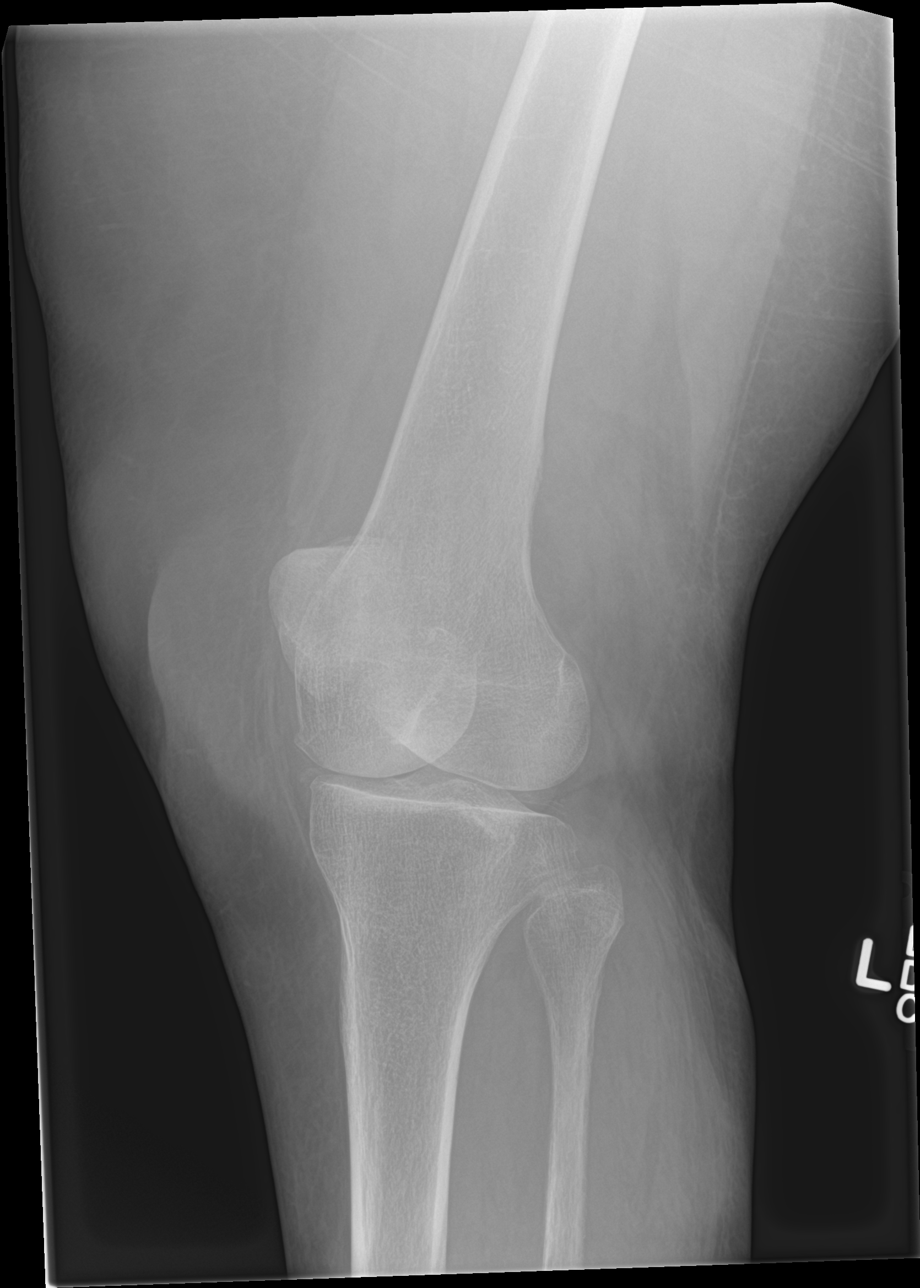
[im 3/4]
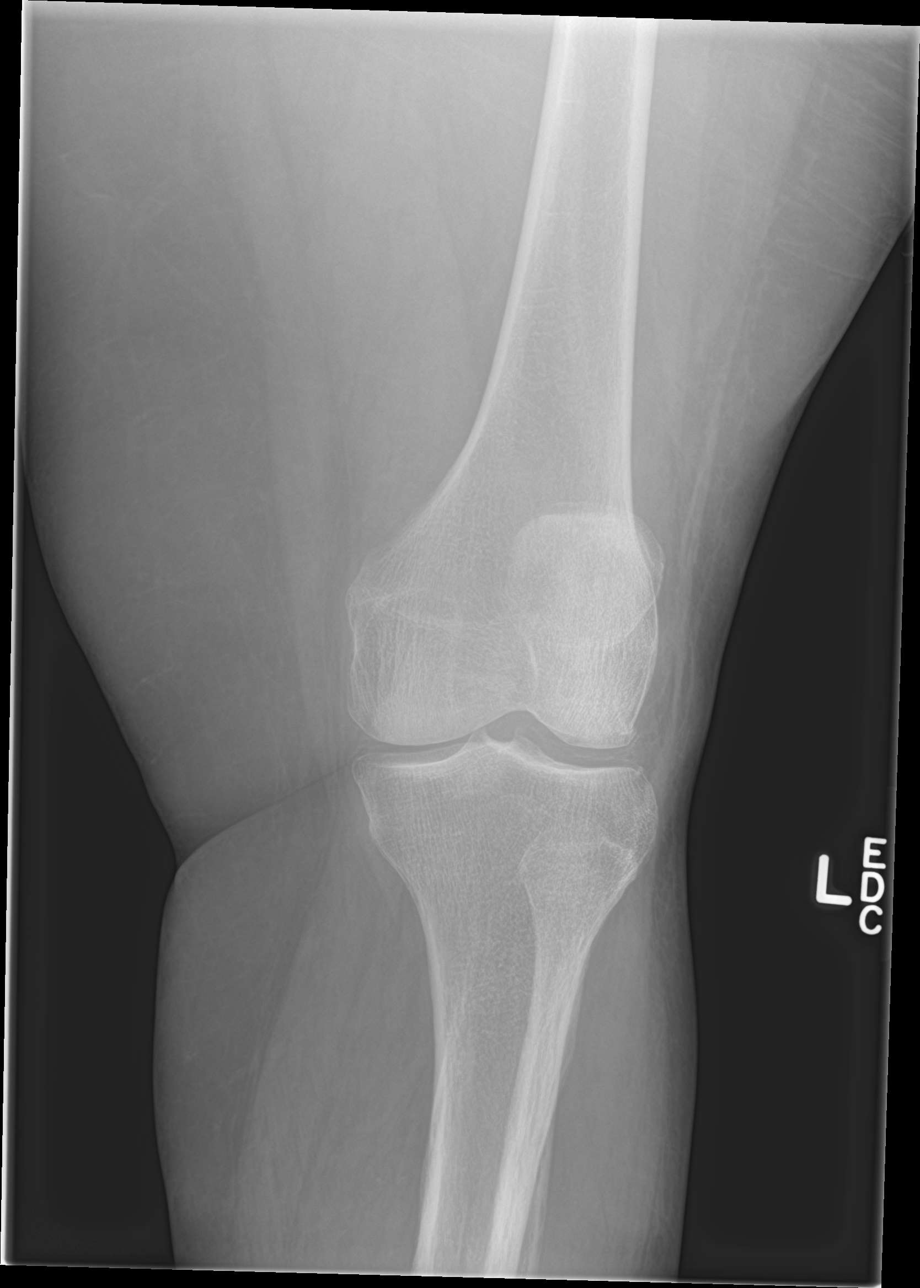
[im 4/4]
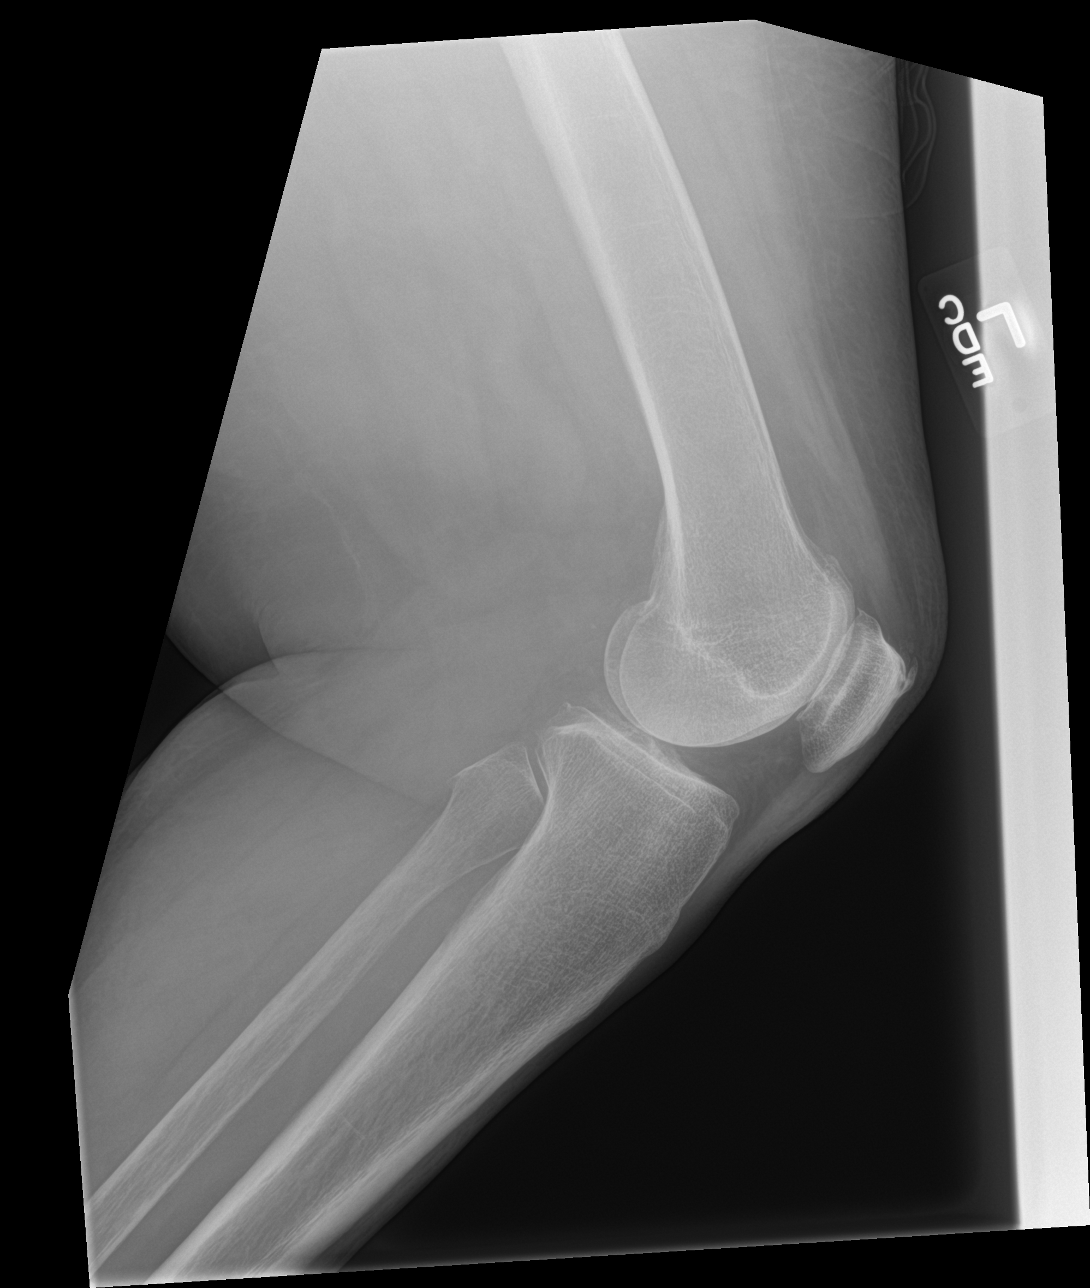

[4 of 4 positions shown; findings below may reference images not displayed]

FINDINGS: No fracture or subluxation. Mild tricompartmental osteoarthritis
with peripheral spurring as well as spurring of the tibial spines.
Medial and lateral chondrocalcinosis. Small quadriceps tendon
enthesophyte. Trace joint effusion.
IMPRESSION: Mild tricompartmental osteoarthritis with chondrocalcinosis. No
acute osseous abnormality.

Trace joint effusion.

## 2018-05-14 ENCOUNTER — Other Ambulatory Visit: Payer: Self-pay

## 2018-05-14 ENCOUNTER — Observation Stay
Admission: EM | Admit: 2018-05-14 | Discharge: 2018-05-15 | Disposition: A | Discharge status: Home / Self Care | Payer: Medicare Other | Attending: Internal Medicine | Admitting: Internal Medicine

## 2018-05-14 ENCOUNTER — Observation Stay: Payer: Medicare Other

## 2018-05-14 ENCOUNTER — Encounter: Payer: Self-pay | Admitting: Emergency Medicine

## 2018-05-14 DIAGNOSIS — E119 Type 2 diabetes mellitus without complications: Secondary | ICD-10-CM | POA: Diagnosis not present

## 2018-05-14 DIAGNOSIS — Z79899 Other long term (current) drug therapy: Secondary | ICD-10-CM | POA: Insufficient documentation

## 2018-05-14 DIAGNOSIS — R9082 White matter disease, unspecified: Secondary | ICD-10-CM | POA: Diagnosis not present

## 2018-05-14 DIAGNOSIS — I10 Essential (primary) hypertension: Secondary | ICD-10-CM | POA: Insufficient documentation

## 2018-05-14 DIAGNOSIS — H6692 Otitis media, unspecified, left ear: Secondary | ICD-10-CM | POA: Insufficient documentation

## 2018-05-14 DIAGNOSIS — Z7982 Long term (current) use of aspirin: Secondary | ICD-10-CM | POA: Diagnosis not present

## 2018-05-14 DIAGNOSIS — Z7984 Long term (current) use of oral hypoglycemic drugs: Secondary | ICD-10-CM | POA: Insufficient documentation

## 2018-05-14 DIAGNOSIS — E785 Hyperlipidemia, unspecified: Secondary | ICD-10-CM | POA: Insufficient documentation

## 2018-05-14 DIAGNOSIS — Z66 Do not resuscitate: Secondary | ICD-10-CM | POA: Diagnosis not present

## 2018-05-14 DIAGNOSIS — R42 Dizziness and giddiness: Principal | ICD-10-CM

## 2018-05-14 LAB — CBC
HCT: 35.9 % (ref 35.0–47.0)
HEMOGLOBIN: 12.3 g/dL (ref 12.0–16.0)
MCH: 28 pg (ref 26.0–34.0)
MCHC: 34.3 g/dL (ref 32.0–36.0)
MCV: 81.6 fL (ref 80.0–100.0)
PLATELETS: 207 10*3/uL (ref 150–440)
RBC: 4.4 MIL/uL (ref 3.80–5.20)
RDW: 14 % (ref 11.5–14.5)
WBC: 5.8 10*3/uL (ref 3.6–11.0)

## 2018-05-14 LAB — URINALYSIS, COMPLETE (UACMP) WITH MICROSCOPIC
Bacteria, UA: NONE SEEN
Bilirubin Urine: NEGATIVE
GLUCOSE, UA: NEGATIVE mg/dL
HGB URINE DIPSTICK: NEGATIVE
Ketones, ur: NEGATIVE mg/dL
Leukocytes, UA: NEGATIVE
Nitrite: NEGATIVE
Protein, ur: NEGATIVE mg/dL
SPECIFIC GRAVITY, URINE: 1.002 — AB (ref 1.005–1.030)
pH: 7 (ref 5.0–8.0)

## 2018-05-14 LAB — BASIC METABOLIC PANEL
Anion gap: 5 (ref 5–15)
BUN: 12 mg/dL (ref 8–23)
CO2: 31 mmol/L (ref 22–32)
CREATININE: 0.96 mg/dL (ref 0.44–1.00)
Calcium: 9.9 mg/dL (ref 8.9–10.3)
Chloride: 107 mmol/L (ref 98–111)
GFR calc Af Amer: >60 mL/min (ref >60)
GFR, EST NON AFRICAN AMERICAN: 53 mL/min — AB (ref >60)
GLUCOSE: 106 mg/dL — AB (ref 70–99)
Potassium: 4.5 mmol/L (ref 3.5–5.1)
SODIUM: 143 mmol/L (ref 135–145)

## 2018-05-14 LAB — GLUCOSE, CAPILLARY
GLUCOSE-CAPILLARY: 93 mg/dL (ref 70–99)
GLUCOSE-CAPILLARY: 215 mg/dL — AB (ref 70–99)
Glucose-Capillary: 100 mg/dL — ABNORMAL HIGH (ref 70–99)

## 2018-05-14 LAB — MAGNESIUM: MAGNESIUM: 2.1 mg/dL (ref 1.7–2.4)

## 2018-05-14 LAB — TROPONIN I: Troponin I: <0.03 ng/mL (ref <0.03)

## 2018-05-14 MED ORDER — SENNOSIDES-DOCUSATE SODIUM 8.6-50 MG PO TABS: 1.0000 | ORAL_TABLET | Freq: Every evening | ORAL | Status: DC | PRN

## 2018-05-14 MED ORDER — LOSARTAN POTASSIUM 50 MG PO TABS
100.0000 mg | ORAL_TABLET | Freq: Every day | ORAL | Status: DC
  Administered 2018-05-15: 100 mg via ORAL
  Filled 2018-05-14: qty 2

## 2018-05-14 MED ORDER — SODIUM CHLORIDE 0.9 % IV SOLN: 250.0000 mL | INTRAVENOUS | Status: DC | PRN

## 2018-05-14 MED ORDER — ASPIRIN EC 81 MG PO TBEC
81.0000 mg | DELAYED_RELEASE_TABLET | Freq: Every day | ORAL | Status: DC
  Administered 2018-05-15: 81 mg via ORAL
  Filled 2018-05-14: qty 1

## 2018-05-14 MED ORDER — ALBUTEROL SULFATE (2.5 MG/3ML) 0.083% IN NEBU: 2.5000 mg | INHALATION_SOLUTION | RESPIRATORY_TRACT | Status: DC | PRN

## 2018-05-14 MED ORDER — DIAZEPAM 5 MG/ML IJ SOLN
2.0000 mg | Freq: Once | INTRAMUSCULAR | Status: AC
  Administered 2018-05-14: 2 mg via INTRAVENOUS
  Filled 2018-05-14: qty 2

## 2018-05-14 MED ORDER — INSULIN ASPART 100 UNIT/ML ~~LOC~~ SOLN: 0.0000 [IU] | Freq: Every day | SUBCUTANEOUS | Status: DC

## 2018-05-14 MED ORDER — ONDANSETRON HCL 4 MG PO TABS: 4.0000 mg | ORAL_TABLET | Freq: Four times a day (QID) | ORAL | Status: DC | PRN

## 2018-05-14 MED ORDER — INSULIN ASPART 100 UNIT/ML ~~LOC~~ SOLN
0.0000 [IU] | Freq: Three times a day (TID) | SUBCUTANEOUS | Status: DC
  Administered 2018-05-14: 3 [IU] via SUBCUTANEOUS
  Filled 2018-05-14: qty 1

## 2018-05-14 MED ORDER — ACETAMINOPHEN 325 MG PO TABS
650.0000 mg | ORAL_TABLET | Freq: Four times a day (QID) | ORAL | Status: DC | PRN
  Filled 2018-05-14: qty 2

## 2018-05-14 MED ORDER — SODIUM CHLORIDE 0.9% FLUSH
3.0000 mL | Freq: Two times a day (BID) | INTRAVENOUS | Status: DC
  Administered 2018-05-14 – 2018-05-15 (×2): 3 mL via INTRAVENOUS

## 2018-05-14 MED ORDER — ROSUVASTATIN CALCIUM 10 MG PO TABS
5.0000 mg | ORAL_TABLET | Freq: Every day | ORAL | Status: DC
  Administered 2018-05-14: 5 mg via ORAL
  Filled 2018-05-14: qty 1

## 2018-05-14 MED ORDER — DIAZEPAM 2 MG PO TABS: 2.0000 mg | ORAL_TABLET | Freq: Four times a day (QID) | ORAL | Status: DC | PRN

## 2018-05-14 MED ORDER — VITAMIN D3 25 MCG (1000 UNIT) PO TABS
1000.0000 [IU] | ORAL_TABLET | Freq: Every day | ORAL | Status: DC
  Administered 2018-05-15 (×2): 1000 [IU] via ORAL
  Filled 2018-05-14 (×2): qty 1

## 2018-05-14 MED ORDER — ENOXAPARIN SODIUM 40 MG/0.4ML ~~LOC~~ SOLN
40.0000 mg | SUBCUTANEOUS | Status: DC
  Administered 2018-05-14: 40 mg via SUBCUTANEOUS
  Filled 2018-05-14: qty 0.4

## 2018-05-14 MED ORDER — SODIUM CHLORIDE 0.9 % IV BOLUS
1000.0000 mL | Freq: Once | INTRAVENOUS | Status: AC
  Administered 2018-05-14: 1000 mL via INTRAVENOUS

## 2018-05-14 MED ORDER — MECLIZINE HCL 25 MG PO TABS
25.0000 mg | ORAL_TABLET | Freq: Once | ORAL | Status: AC
  Administered 2018-05-14: 25 mg via ORAL
  Filled 2018-05-14: qty 1

## 2018-05-14 MED ORDER — MECLIZINE HCL 25 MG PO TABS
25.0000 mg | ORAL_TABLET | Freq: Three times a day (TID) | ORAL | Status: DC
  Administered 2018-05-14 – 2018-05-15 (×3): 25 mg via ORAL
  Filled 2018-05-14 (×5): qty 1

## 2018-05-14 MED ORDER — BISACODYL 5 MG PO TBEC: 5.0000 mg | DELAYED_RELEASE_TABLET | Freq: Every day | ORAL | Status: DC | PRN

## 2018-05-14 MED ORDER — SODIUM CHLORIDE 0.9% FLUSH: 3.0000 mL | INTRAVENOUS | Status: DC | PRN

## 2018-05-14 MED ORDER — ACETAMINOPHEN 650 MG RE SUPP: 650.0000 mg | Freq: Four times a day (QID) | RECTAL | Status: DC | PRN

## 2018-05-14 MED ORDER — HYDROCODONE-ACETAMINOPHEN 5-325 MG PO TABS: 1.0000 | ORAL_TABLET | ORAL | Status: DC | PRN

## 2018-05-14 MED ORDER — ONDANSETRON HCL 4 MG/2ML IJ SOLN: 4.0000 mg | Freq: Four times a day (QID) | INTRAMUSCULAR | Status: DC | PRN

## 2018-05-14 NOTE — Progress Notes (Signed)
Advanced Care Plan.  Purpose of Encounter: CODE STATUS. Parties in Attendance: The patient and me. Patient's Decisional Capacity: Yes Medical Story: Connie Frederick  is a 84 y.o. female with a known history of hypertension, diabetes, vertigo.  The patient presents in the ED with dizziness episode since 7 AM today.  She is being admitted for vertigo.  Discussed with patient about her current condition, prognosis and CODE STATUS.  The patient is alert, awake and oriented.  She stated that she does not want to be resuscitated or intubated. Plan:  Code Status: DNR. Time spent discussing advance care planning: 16-17 minutes.

## 2018-05-14 NOTE — ED Notes (Signed)
Pt c/o pain at IV site, requests IV removed. Early  Infiltration suspected.

## 2018-05-14 NOTE — ED Triage Notes (Signed)
Pt presents with dizziness since 0730. States she has hx of vertigo but that this is worse than the last time she was here. Pt denies weakness on either side of her body, difficulty speaking, numbness, pain. NAD noted.

## 2018-05-14 NOTE — ED Provider Notes (Addendum)
[facility] Emergency Department Provider Note ____________________________________________   First MD Initiated Contact with Patient 05/14/18 1032     (approximate)  I have reviewed the triage vital signs and the nursing notes.   HISTORY  Chief Complaint Dizziness  HPI Connie Frederick is a 84 y.o. female with a history of diabetes, hypertension and vertigo who is presenting to the emergency department with vertigo.  She says that the episode this morning started at about 7 AM.  She says that at this time that her vertigo is improved and while she is lying still she feels fine.  However, if she turns her head from side to side she feels that the room is spinning.  She denies any weakness or numbness.  Said that she had nausea previously this morning but it has resolved and the vertigo has improved.  She was seen for similar condition approximately 2 months ago when she had a CT and an MRI which were both negative.  She was discharged home with meclizine which she is out of at this time.  She did not take any medicine today prior to arrival.  Denies any cough or sinus congestion.  Denies any recent URI symptoms.  Denies any burning with urination.  Says that she has been eating and drinking normally especially water.  Past Medical History:  Diagnosis Date  . Diabetes mellitus without complication (HCC)   . Hypertension     Patient Active Problem List   Diagnosis Date Noted  . Varicose veins of leg with pain, bilateral 03/29/2017  . Swelling of limb 03/29/2017  . Hyperlipidemia 02/22/2017  . Diabetes (HCC) 02/22/2017  . Pain and swelling of lower extremity 02/22/2017    Past Surgical History:  Procedure Laterality Date  . ABDOMINAL HYSTERECTOMY      Prior to Admission medications   Medication Sig Start Date End Date Taking? Authorizing Provider  acetaminophen (TYLENOL) 650 MG CR tablet Take 650 mg by mouth every 8 (eight) hours as needed for pain.   Yes  [provider]  aspirin EC 81 MG tablet Take 81 mg by mouth daily.   Yes [provider]  Cholecalciferol (D3-1000) 1000 units tablet Take 1,000 Units by mouth daily.   Yes [provider]  glipiZIDE (GLUCOTROL XL) 2.5 MG 24 hr tablet Take 2.5 mg by mouth daily. 08/25/15  Yes [provider]  losartan (COZAAR) 100 MG tablet Take 100 mg by mouth daily. 08/11/15  Yes [provider]  polyethylene glycol powder (GLYCOLAX/MIRALAX) powder Take 17 g by mouth daily as needed for mild constipation.  10/14/15  Yes [provider]  rosuvastatin (CRESTOR) 5 MG tablet Take 5 mg by mouth daily.   Yes [provider]  meclizine (ANTIVERT) 12.5 MG tablet Take 1 tablet (12.5 mg total) by mouth every 8 (eight) hours as needed for dizziness. Patient not taking: Reported on 05/14/2018 03/15/18   Willy Eddy, MD  ondansetron (ZOFRAN ODT) 4 MG disintegrating tablet Take 1 tablet (4 mg total) by mouth every 8 (eight) hours as needed for nausea or vomiting. Patient not taking: Reported on 05/14/2018 11/01/17   Nita Sickle, MD    Allergies Patient has no known allergies.  Family History  Problem Relation Age of Onset  . Hypertension Brother     Social History Social History   Tobacco Use  . Smoking status: Never Smoker  . Smokeless tobacco: Never Used  Substance Use Topics  . Alcohol use: No  .  Drug use: No    Review of Systems  Constitutional: No fever/chills Eyes: No visual changes. ENT: No sore throat. Cardiovascular: Denies chest pain. Respiratory: Denies shortness of breath. Gastrointestinal: No abdominal pain.    No diarrhea.  No constipation. Genitourinary: Negative for dysuria. Musculoskeletal: Negative for back pain. Skin: Negative for rash. Neurological: Negative for headaches, focal weakness or numbness.   ____________________________________________   PHYSICAL EXAM:  VITAL SIGNS: ED Triage Vitals  Enc  Vitals Group     BP 05/14/18 1027 (!) 166/74     Pulse Rate 05/14/18 1027 78     Resp 05/14/18 1027 18     Temp 05/14/18 1027 98 F (36.7 C)     Temp Source 05/14/18 1027 Oral     SpO2 05/14/18 1027 94 %     Weight 05/14/18 1028 179 lb (81.2 kg)     Height 05/14/18 1028 5' (1.524 m)     Head Circumference --      Peak Flow --      Pain Score 05/14/18 1027 0     Pain Loc --      Pain Edu? --      Excl. in GC? --     Constitutional: Alert and oriented. Well appearing and in no acute distress. Eyes: Conjunctivae are normal.  Head: Atraumatic.  Normal TMs bilaterally. Nose: No congestion/rhinnorhea. Mouth/Throat: Mucous membranes are moist.  Neck: No stridor.   Cardiovascular: Normal rate, regular rhythm. Grossly normal heart sounds.   Respiratory: Normal respiratory effort.  No retractions. Lungs CTAB. Gastrointestinal: Soft and nontender. No distention. Musculoskeletal: No lower extremity tenderness nor edema.  No joint effusions. Neurologic:  Normal speech and language. No gross focal neurologic deficits are appreciated.  No ataxia on finger-nose testing normal and heel-to-shin testing.  No nystagmus. Skin:  Skin is warm, dry and intact. No rash noted. Psychiatric: Mood and affect are normal. Speech and behavior are normal.  ____________________________________________   LABS (all labs ordered are listed, but only abnormal results are displayed)  Labs Reviewed  BASIC METABOLIC PANEL - Abnormal; Notable for the following components:      Result Value   Glucose, Bld 106 (*)    GFR calc non Af Amer 53 (*)    All other components within normal limits  URINALYSIS, COMPLETE (UACMP) WITH MICROSCOPIC - Abnormal; Notable for the following components:   Color, Urine COLORLESS (*)    APPearance CLEAR (*)    Specific Gravity, Urine 1.002 (*)    All other components within normal limits  GLUCOSE, CAPILLARY  TROPONIN I  CBC  CBG MONITORING, ED    ____________________________________________  EKG  ED ECG REPORT I, Arelia Longestavid M Schaevitz, the attending physician, personally viewed and interpreted this ECG.   Date: 05/14/2018  EKG Time: 1029  Rate: 76  Rhythm: normal sinus rhythm  Axis: Left  Intervals:none  ST&T Change: No ST segment elevation or depression.  No abnormal T wave inversion.  ____________________________________________  RADIOLOGY   ____________________________________________   PROCEDURES  Procedure(s) performed:   Procedures  Critical Care performed:   ____________________________________________   INITIAL IMPRESSION / ASSESSMENT AND PLAN / ED COURSE  Pertinent labs & imaging results that were available during my care of the patient were reviewed by me and considered in my medical decision making (see chart for details).  Differential diagnosis includes, but is not limited to, alcohol, illicit or prescription medications, or other toxic ingestion; intracranial pathology such as stroke or intracerebral hemorrhage; fever or infectious causes  including sepsis; hypoxemia and/or hypercarbia; uremia; trauma; endocrine related disorders such as diabetes, hypoglycemia, and thyroid-related diseases; hypertensive encephalopathy; etc. peripheral vertigo. As part of my medical decision making, I reviewed the following data within the electronic MEDICAL RECORD NUMBER Notes from prior ED visits  No findings on the exam that make me highly suspicious for central vertigo.  We will give meclizine and reassess.  Pending lab results at this time.  ----------------------------------------- 2:26 PM on 05/14/2018 -----------------------------------------  Patient at this time with persistent vertigo despite meclizine, Valium as well as fluids.  Patient will be admitted to the hospital.  We will also recheck a CT scan of the brain.  Signed out to Dr. Imogene Burn.  Patient understand the diagnosis as well as treatment plan and  willing to comply.  Patient had an extensive work-up including CT and MRI this past June for similar symptoms which found no acute findings.  My feeling is that this is a peripheral case of vertigo.  However, given the patient's age and other risk factors that is still possible this could be a centrally cause vertigo. ____________________________________________   FINAL CLINICAL IMPRESSION(S) / ED DIAGNOSES  Vertigo.  NEW MEDICATIONS STARTED DURING THIS VISIT:  New Prescriptions   No medications on file     Note:  This document was prepared using Dragon voice recognition software and may include unintentional dictation errors.     Myrna Blazer, MD 05/14/18 1426    Schaevitz, Myra Rude, MD 05/14/18 [phone]

## 2018-05-14 NOTE — Plan of Care (Signed)
  Problem: Education: Goal: Knowledge of General Education information will improve Description Including pain rating scale, medication(s)/side effects and non-pharmacologic comfort measures Outcome: Progressing   Problem: Health Behavior/Discharge Planning: Goal: Ability to manage health-related needs will improve Outcome: Progressing   Problem: Activity: Goal: Risk for activity intolerance will decrease Outcome: Progressing   Problem: Nutrition: Goal: Adequate nutrition will be maintained Outcome: Progressing   Problem: Coping: Goal: Level of anxiety will decrease Outcome: Progressing   Problem: Elimination: Goal: Will not experience complications related to bowel motility Outcome: Progressing   Problem: Safety: Goal: Ability to remain free from injury will improve Outcome: Progressing   Problem: Spiritual Needs Goal: Ability to function at adequate level Outcome: Progressing

## 2018-05-14 NOTE — ED Notes (Signed)
Pt resting in bed, no distress, eating, visiting with family.

## 2018-05-14 NOTE — H&P (Addendum)
[facility] [place] [facility]   PATIENT NAME: Connie Frederick    MR#:  [id number]  DATE OF BIRTH:  June 16, 1933  DATE OF ADMISSION:  05/14/2018  PRIMARY CARE PHYSICIAN: [name], MD   REQUESTING/REFERRING PHYSICIAN: Dr. Pershing Proud  CHIEF COMPLAINT:   Chief Complaint  Patient presents with  . Dizziness   Dizziness since this morning. HISTORY OF PRESENT ILLNESS:  Connie Frederick  is a 84 y.o. female with a known history of hypertension, diabetes, vertigo.  The patient presents the ED with above chief complaints.  She started to have dizziness episode since 7 AM today.  Dizziness got worse when he was turning her head from side-to-side.  She denies any headache, numbness or tingling or focal weakness.  She has history of vertigo and is taking Antivert but she ran out of medication recently.  She still has vertigo in ED after taking Valium.  She cannot stand up or walk.  ED physician request admission.  She had a CAT scan of the head and MRI of the brain in June which were unremarkable.  She also mentioned she had ear ring before dizziness.  She never has ENT appointment. PAST MEDICAL HISTORY:   Past Medical History:  Diagnosis Date  . Diabetes mellitus without complication (HCC)   . Hypertension     PAST SURGICAL HISTORY:   Past Surgical History:  Procedure Laterality Date  . ABDOMINAL HYSTERECTOMY      SOCIAL HISTORY:   Social History   Tobacco Use  . Smoking status: Never Smoker  . Smokeless tobacco: Never Used  Substance Use Topics  . Alcohol use: No    FAMILY HISTORY:   Family History  Problem Relation Age of Onset  . Hypertension Brother     DRUG ALLERGIES:  No Known Allergies  REVIEW OF SYSTEMS:   Review of Systems  Constitutional: Negative for chills, fever and malaise/fatigue.  HENT: Negative for sore throat.   Eyes: Negative for blurred vision and double vision.  Respiratory: Negative for cough, hemoptysis, shortness of breath,  wheezing and stridor.   Cardiovascular: Negative for chest pain, palpitations, orthopnea and leg swelling.  Gastrointestinal: Negative for abdominal pain, blood in stool, diarrhea, melena, nausea and vomiting.  Genitourinary: Negative for dysuria, flank pain and hematuria.  Musculoskeletal: Negative for back pain and joint pain.  Skin: Negative for rash.  Neurological: Positive for dizziness. Negative for sensory change, focal weakness, seizures, loss of consciousness, weakness and headaches.  Endo/Heme/Allergies: Negative for polydipsia.  Psychiatric/Behavioral: Negative for depression. The patient is not nervous/anxious.     MEDICATIONS AT HOME:   Prior to Admission medications   Medication Sig Start Date End Date Taking? Authorizing Provider  acetaminophen (TYLENOL) 650 MG CR tablet Take 650 mg by mouth every 8 (eight) hours as needed for pain.   Yes [provider]  aspirin EC 81 MG tablet Take 81 mg by mouth daily.   Yes [provider]  Cholecalciferol (D3-1000) 1000 units tablet Take 1,000 Units by mouth daily.   Yes [provider]  glipiZIDE (GLUCOTROL XL) 2.5 MG 24 hr tablet Take 2.5 mg by mouth daily. 08/25/15  Yes [provider]  losartan (COZAAR) 100 MG tablet Take 100 mg by mouth daily. 08/11/15  Yes [provider]  polyethylene glycol powder (GLYCOLAX/MIRALAX) powder Take 17 g by mouth daily as needed for mild constipation.  10/14/15  Yes [provider]  rosuvastatin (CRESTOR) 5 MG tablet Take 5 mg by mouth  daily.   Yes [provider]  meclizine (ANTIVERT) 12.5 MG tablet Take 1 tablet (12.5 mg total) by mouth every 8 (eight) hours as needed for dizziness. Patient not taking: Reported on 05/14/2018 03/15/18   Willy Eddyobinson, Patrick, MD  ondansetron (ZOFRAN ODT) 4 MG disintegrating tablet Take 1 tablet (4 mg total) by mouth every 8 (eight) hours as needed for nausea or vomiting. Patient not taking: Reported on 05/14/2018  11/01/17   Nita SickleVeronese, [place]led[place], MD      VITAL SIGNS:  Blood pressure (!) 144/94, pulse 78, temperature 98 F (36.7 C), temperature source Oral, resp. rate 17, height 5' (1.524 m), weight 81.2 kg, SpO2 94 %.  PHYSICAL EXAMINATION:  Physical Exam  GENERAL:  84 y.o.-year-old patient lying in the bed with no acute distress.  EYES: Pupils equal, round, reactive to light and accommodation. No scleral icterus. Extraocular muscles intact.  HEENT: Head atraumatic, normocephalic. Oropharynx and nasopharynx clear.  NECK:  Supple, no jugular venous distention. No thyroid enlargement, no tenderness.  LUNGS: Normal breath sounds bilaterally, no wheezing, rales,rhonchi or crepitation. No use of accessory muscles of respiration.  CARDIOVASCULAR: S1, S2 normal. No murmurs, rubs, or gallops.  ABDOMEN: Soft, nontender, nondistended. Bowel sounds present. No organomegaly or mass.  EXTREMITIES: No pedal edema, cyanosis, or clubbing.  NEUROLOGIC: Cranial nerves II through XII are intact. Muscle strength 5/5 in all extremities. Sensation intact. Gait not checked.  PSYCHIATRIC: The patient is alert and oriented x 3.  SKIN: No obvious rash, lesion, or ulcer.   LABORATORY PANEL:   CBC Recent Labs  Lab 05/14/18 1132  WBC 5.8  HGB 12.3  HCT 35.9  PLT 207   ------------------------------------------------------------------------------------------------------------------  Chemistries  Recent Labs  Lab 05/14/18 1048  NA 143  K 4.5  CL 107  CO2 31  GLUCOSE 106*  BUN 12  CREATININE 0.96  CALCIUM 9.9   ------------------------------------------------------------------------------------------------------------------  Cardiac Enzymes Recent Labs  Lab 05/14/18 1048  TROPONINI <0.03   ------------------------------------------------------------------------------------------------------------------  RADIOLOGY:  No results found.    IMPRESSION AND PLAN:   Vertigo The patient will be placed  for observation. Start Antivert 3 times daily, Tylenol as needed. I discussed with ENT physician Dr. Evangeline GulaMcQeen, who suggested follow-up as outpatient.   Hypertension, continue home hypertension medication Cozaar.  Diabetes.  Start sliding scale.  Glipizide.  All the records are reviewed and case discussed with ED provider. Management plans discussed with the patient, family and they are in agreement.  CODE STATUS: DNR.  TOTAL TIME TAKING CARE OF THIS PATIENT: 31 minutes.    Shaune PollackQing Marline Morace M.D on 05/14/2018 at 2:43 PM  Between 7am to 6pm - Pager - [phone]  After 6pm go to [email or web address] - Social research officer, governmentpassword [email or web address]  [facility]  Office  [phone]  CC: Primary care physician; Hillery AldoPatel, Sarah, MD   Note: This dictation was prepared with Dragon dictation along with smaller phrase technology. Any transcriptional errors that result from this process are unin

## 2018-05-14 NOTE — Progress Notes (Signed)
Chaplain received OR for AD. Chaplain visit patient. She was visiting with her brother. Chaplain introduced herself as on-call Chaplain and patient was glad to see her. Patient is a woman of faith. Chaplain educated the patient on AD and patient ask Chaplain if she could leave AD with her and her daughter could go over it. Chaplain said sure and you can have the nurse page us when you are ready to complete AD. 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

## 2018-05-14 NOTE — ED Triage Notes (Signed)
First RN: Pt presents to ED with c/o dizziness since approx 0730 this AM. Pt states dx with vertigo a month ago. Pt states dizziness worse when she looks down. Pt denies numbness, weakness, or difficulty speaking or memory loss.

## 2018-05-14 NOTE — ED Notes (Signed)
Pt assisted to restroom, dizzy upon standing, orthostatic vitals checked, negative.

## 2018-05-15 DIAGNOSIS — R42 Dizziness and giddiness: Secondary | ICD-10-CM | POA: Diagnosis not present

## 2018-05-15 LAB — CBC
HEMATOCRIT: 35.6 % (ref 35.0–47.0)
HEMOGLOBIN: 12 g/dL (ref 12.0–16.0)
MCH: 28.1 pg (ref 26.0–34.0)
MCHC: 33.8 g/dL (ref 32.0–36.0)
MCV: 83.2 fL (ref 80.0–100.0)
Platelets: 192 10*3/uL (ref 150–440)
RBC: 4.28 MIL/uL (ref 3.80–5.20)
RDW: 13.9 % (ref 11.5–14.5)
WBC: 6.3 10*3/uL (ref 3.6–11.0)

## 2018-05-15 LAB — BASIC METABOLIC PANEL
ANION GAP: 6 (ref 5–15)
BUN: 20 mg/dL (ref 8–23)
CHLORIDE: 106 mmol/L (ref 98–111)
CO2: 29 mmol/L (ref 22–32)
Calcium: 8.7 mg/dL — ABNORMAL LOW (ref 8.9–10.3)
Creatinine, Ser: 1.11 mg/dL — ABNORMAL HIGH (ref 0.44–1.00)
GFR calc non Af Amer: 45 mL/min — ABNORMAL LOW (ref >60)
GFR, EST AFRICAN AMERICAN: 52 mL/min — AB (ref >60)
Glucose, Bld: 96 mg/dL (ref 70–99)
POTASSIUM: 4.1 mmol/L (ref 3.5–5.1)
SODIUM: 141 mmol/L (ref 135–145)

## 2018-05-15 LAB — GLUCOSE, CAPILLARY
Glucose-Capillary: 92 mg/dL (ref 70–99)
Glucose-Capillary: 117 mg/dL — ABNORMAL HIGH (ref 70–99)

## 2018-05-15 MED ORDER — AMOXICILLIN 500 MG PO CAPS
500.0000 mg | ORAL_CAPSULE | Freq: Three times a day (TID) | ORAL | Status: DC
  Administered 2018-05-15: 500 mg via ORAL
  Filled 2018-05-15 (×2): qty 1

## 2018-05-15 MED ORDER — PROMETHAZINE HCL 12.5 MG PO TABS: 12.5000 mg | ORAL_TABLET | Freq: Three times a day (TID) | ORAL | 0 refills | Status: DC | PRN

## 2018-05-15 MED ORDER — AMOXICILLIN 500 MG PO CAPS: 500.0000 mg | ORAL_CAPSULE | Freq: Three times a day (TID) | ORAL | 0 refills | Status: AC

## 2018-05-15 MED ORDER — DIAZEPAM 2 MG PO TABS: 2.0000 mg | ORAL_TABLET | Freq: Three times a day (TID) | ORAL | 0 refills | Status: AC | PRN

## 2018-05-15 MED ORDER — MECLIZINE HCL 25 MG PO TABS: 25.0000 mg | ORAL_TABLET | Freq: Three times a day (TID) | ORAL | 1 refills | Status: DC | PRN

## 2018-05-15 NOTE — Progress Notes (Signed)
VSS. RN explained discharge instructions to pt and pt verbalized understanding. IV removed. Prescriptions and PT orders given to pt. Pt wheeled to visitors entrance and assisted into vehicle.

## 2018-05-15 NOTE — Care Management (Signed)
[facility] on [address] for OP PT. Dr. Nemiah CommanderKalisetti to place order. TC to [facility], appointment scheduled for Wednesday at 1230 pm. Patient updated and is agreeable to POC, date and time. Prescription given to patient by primary nurse.

## 2018-05-15 NOTE — Discharge Summary (Signed)
[employer] - [place] at [facility]   PATIENT NAME: Connie Frederick    MR#:  [id number]  DATE OF BIRTH:  [birth year]  DATE OF ADMISSION:  05/14/2018   ADMITTING PHYSICIAN: Shaune Pollack, MD  DATE OF DISCHARGE: 05/15/2018  3:08 PM  PRIMARY CARE PHYSICIAN: [name], MD   ADMISSION DIAGNOSIS:   Vertigo [R42]  DISCHARGE DIAGNOSIS:   Active Problems:   Vertigo   SECONDARY DIAGNOSIS:   Past Medical History:  Diagnosis Date  . Diabetes mellitus without complication (HCC)   . Hypertension     HOSPITAL COURSE:   84 year old female with past medical history significant for hypertension, diabetes and prior history of vertigo comes to hospital secondary to severe dizziness.  1.  Dizziness-also associated with left ear fullness and tinnitus. -Appreciate detailed physical therapy notes about vertigo exam. -MRI of the brain done for similar presentation 2 months ago showing no acute infarcts but does have chronic small vessel ischemic changes. -Patient was started on meclizine and Valium with significant improvement.  No orthostatics noted. -Ambulated well with physical therapy.  Will be discharged on the same medications and will follow-up with ENT. -Outpatient physical therapy recommended -Amoxicillin started for left ear otitis media  2.  Diabetes mellitus-continue home dose of glipizide.  3.  Hypertension-on losartan  4.  Hyperlipidemia-on statin  Patient is ambulatory.  Will be discharged home today  DISCHARGE CONDITIONS:   Guarded  CONSULTS OBTAINED:   Treatment Team:  Linus Salmons, MD  DRUG ALLERGIES:   No Known Allergies DISCHARGE MEDICATIONS:   Allergies as of 05/15/2018   No Known Allergies     Medication List    STOP taking these medications   ondansetron 4 MG disintegrating tablet Commonly known as:  ZOFRAN-ODT     TAKE these medications   acetaminophen 650 MG CR tablet Commonly known as:  TYLENOL Take 650 mg by mouth  every 8 (eight) hours as needed for pain.   amoxicillin 500 MG capsule Commonly known as:  AMOXIL Take 1 capsule (500 mg total) by mouth every 8 (eight) hours for 7 days.   aspirin EC 81 MG tablet Take 81 mg by mouth daily.   D3-1000 1000 units tablet Generic drug:  Cholecalciferol Take 1,000 Units by mouth daily.   diazepam 2 MG tablet Commonly known as:  VALIUM Take 1 tablet (2 mg total) by mouth every 8 (eight) hours as needed (dizziness).   glipiZIDE 2.5 MG 24 hr tablet Commonly known as:  GLUCOTROL XL Take 2.5 mg by mouth daily.   losartan 100 MG tablet Commonly known as:  COZAAR Take 100 mg by mouth daily.   meclizine 25 MG tablet Commonly known as:  ANTIVERT Take 1 tablet (25 mg total) by mouth 3 (three) times daily as needed for dizziness. What changed:    medication strength  how much to take  when to take this   polyethylene glycol powder powder Commonly known as:  GLYCOLAX/MIRALAX Take 17 g by mouth daily as needed for mild constipation.   promethazine 12.5 MG tablet Commonly known as:  PHENERGAN Take 1 tablet (12.5 mg total) by mouth every 8 (eight) hours as needed for nausea or vomiting.   rosuvastatin 5 MG tablet Commonly known as:  CRESTOR Take 5 mg by mouth daily.        DISCHARGE INSTRUCTIONS:   1. PCP f/u in 1 week 2. ENT f/u in 1 week  DIET:   Cardiac diet  ACTIVITY:   Activity  as tolerated  OXYGEN:   Home Oxygen: No.  Oxygen Delivery: room air  DISCHARGE LOCATION:   home   If you experience worsening of your admission symptoms, develop shortness of breath, life threatening emergency, suicidal or homicidal thoughts you must seek medical attention immediately by calling 911 or calling your MD immediately  if symptoms less severe.  You Must read complete instructions/literature along with all the possible adverse reactions/side effects for all the Medicines you take and that have been prescribed to you. Take any new  Medicines after you have completely understood and accpet all the possible adverse reactions/side effects.   Please note  You were cared for by a hospitalist during your hospital stay. If you have any questions about your discharge medications or the care you received while you were in the hospital after you are discharged, you can call the unit and asked to speak with the hospitalist on call if the hospitalist that took care of you is not available. Once you are discharged, your primary care physician will handle any further medical issues. Please note that NO REFILLS for any discharge medications will be authorized once you are discharged, as it is imperative that you return to your primary care physician (or establish a relationship with a primary care physician if you do not have one) for your aftercare needs so that they can reassess your need for medications and monitor your lab values.    On the day of Discharge:  VITAL SIGNS:   Blood pressure (!) 132/55, pulse 63, temperature 97.8 F (36.6 C), temperature source Oral, resp. rate 16, height 5' (1.524 m), weight 81.2 kg, SpO2 99 %.  PHYSICAL EXAMINATION:    GENERAL:  84 y.o.-year-old patient lying in the bed with no acute distress.  EYES: Pupils equal, round, reactive to light and accommodation. No scleral icterus. Extraocular muscles intact.  HEENT: Head atraumatic, normocephalic. Oropharynx and nasopharynx clear.  NECK:  Supple, no jugular venous distention. No thyroid enlargement, no tenderness.  LUNGS: Normal breath sounds bilaterally, no wheezing, rales,rhonchi or crepitation. No use of accessory muscles of respiration. Decreased bibasilar breath sounds CARDIOVASCULAR: S1, S2 normal. No  rubs, or gallops. 2/6 systolic murmur present ABDOMEN: Soft, non-tender, non-distended. Bowel sounds present. No organomegaly or mass.  EXTREMITIES: No pedal edema, cyanosis, or clubbing.  NEUROLOGIC: Cranial nerves II through XII are intact.  Muscle strength 5/5 in all extremities. Sensation intact. Gait not checked.  PSYCHIATRIC: The patient is alert and oriented x 3.  SKIN: No obvious rash, lesion, or ulcer.   DATA REVIEW:   CBC Recent Labs  Lab 05/15/18 0337  WBC 6.3  HGB 12.0  HCT 35.6  PLT 192    Chemistries  Recent Labs  Lab 05/14/18 1048 05/15/18 0337  NA 143 141  K 4.5 4.1  CL 107 106  CO2 31 29  GLUCOSE 106* 96  BUN 12 20  CREATININE 0.96 1.11*  CALCIUM 9.9 8.7*  MG 2.1  --      Microbiology Results  Results for orders placed or performed during the hospital encounter of 11/01/17  C difficile quick scan w PCR reflex     Status: None   Collection Time: 11/01/17  9:38 AM  Result Value Ref Range Status   C Diff antigen NEGATIVE NEGATIVE Final   C Diff toxin NEGATIVE NEGATIVE Final   C Diff interpretation No C. difficile detected.  Final    Comment: Performed at [facility], [address] [place], [place]  [phone]    RADIOLOGY:  No results found.   Management plans discussed with the patient, family and they are in agreement.  CODE STATUS:     Code Status Orders  (From admission, onward)         Start     Ordered   05/14/18 1551  Do not attempt resuscitation (DNR)  Continuous    Question Answer Comment  In the event of cardiac or respiratory ARREST Do not call a "code blue"   In the event of cardiac or respiratory ARREST Do not perform Intubation, CPR, defibrillation or ACLS   In the event of cardiac or respiratory ARREST Use medication by any route, position, wound care, and other measures to relive pain and suffering. May use oxygen, suction and manual treatment of airway obstruction as needed for comfort.      05/14/18 1551        Code Status History    This patient has a current code status but no historical code status.      TOTAL TIME TAKING CARE OF THIS PATIENT: 38 minutes.    Lonney Revak M.D on 05/15/2018 at 3:31 PM  Between 7am to 6pm - Pager -  [phone]  After 6pm go to [email or web address] - Social research officer, [email or web address] [email or web address]  [facility] [place] [facility]  Office  [phone]  CC: Primary care physician; Hillery AldoPatel, Sarah, MD   Note: This dictation was prepared with Dragon dictation along with smaller phrase technology. Any transcriptional errors that result from this process are unintentional.

## 2018-05-15 NOTE — Progress Notes (Signed)
Physical Therapy Evaluation Patient Details Name: Connie Frederick MRN: [id number] DOB: January 10, 1933 Today's Date: 05/15/2018   History of Present Illness  Connie Frederick  is a 84 y.o. female with a known history of hypertension, diabetes, vertigo. The patient presents the ED with complaints of dizziness. She started to have dizziness episode since 7 AM yesterday. Dizziness got worse when he was turning her head from side-to-side.  She denies any headache, numbness or tingling or focal weakness.  She has history of vertigo and is taking Antivert but she ran out of medication recently.  She still has vertigo in ED after taking Valium.  She cannot stand up or walk.  ED physician requested admission.  She had a CAT scan of the head and MRI of the brain in June which were unremarkable. She also mentioned she had ear ringing before dizziness. She reports tinnitus in L ear for the last month. She never has ENT appointment. She denies any ear pain or drainage. No visual changes with episodes. She reports that the symptoms lasted for multiple hours yesterday. At the time of PT evaluation pt reports that her symptoms are mostly resolved.  Clinical Impression  Pt admitted with above diagnosis. Pt currently with functional limitations due to the deficits listed below (see PT Problem List). Pt is independent with bed mobility and supervision only for transfers. Pt is able to ambulate a full lap around RN station with therapist. Gait is slow but pt is steady with use of rolling walker. VSS with ambulation and pt denies fatigue. Pt reports one episode of dizziness with ambulation. Pt with full ocular ROM. No spontaneous or gaze-evoked nystagmus. No focal weakness or numbness/tingling. VOR thrust positive to the right. Abnormal smooth pursuit and horizontal saccades. Positive VOR cancellation. Negative Dix-Hallpike testing bilaterally. Pt would benefit from follow-up with ENT for additional testing including audiogram. She would  benefit from OP PT to work on her balance. Pt will benefit from PT services to address deficits in strength, balance, and mobility in order to return to full function at home.      Follow Up Recommendations Outpatient PT    Equipment Recommendations  None recommended by PT;Other (comment)(Pt advised to use her rollilng walker)    Recommendations for Other Services       Precautions / Restrictions Precautions Precautions: Fall Restrictions Weight Bearing Restrictions: No      Mobility  Bed Mobility Overal bed mobility: Independent             General bed mobility comments: Good speed/sequencing  Transfers Overall transfer level: Needs assistance Equipment used: Rolling walker (2 wheeled) Transfers: Sit to/from Stand Sit to Stand: Supervision         General transfer comment: Pt demonstrates good safety/stability once in standing with UE support on rolling walker  Ambulation/Gait Ambulation/Gait assistance: Min guard Gait Distance (Feet): 200 Feet Assistive device: Rolling walker (2 wheeled)   Gait velocity: Decreased   General Gait Details: Pt is able to ambulate a full lap around RN station with therapist. Gait is slow but pt is steady with use of rolling walker. VSS with ambulation and pt denies fatigue. Pt reports one episode of dizziness.   Stairs            Wheelchair Mobility    Modified Rankin (Stroke Patients Only)       Balance Overall balance assessment: Needs assistance Sitting-balance support: No upper extremity supported Sitting balance-Leahy Scale: Good     Standing balance support: No  upper extremity supported Standing balance-Leahy Scale: Fair Standing balance comment: Able to maintian wide and narrow stance without UE support on walker. Unable to attmept single leg balance due to imbalance                             Pertinent Vitals/Pain Pain Assessment: No/denies pain    Home Living Family/patient expects to  be discharged to:: Private residence Living Arrangements: Alone Available Help at Discharge: Family Type of Home: House Home Access: Ramped entrance     Home Layout: One level Home Equipment: Cane - single point;Cane - quad;Walker - 2 wheels;Walker - 4 wheels;Bedside commode      Prior Function Level of Independence: Independent         Comments: Pt reports independence with ADLs/IADLs. Intermittent use of spc. No falls. Drives     Hand Dominance   Dominant Hand: Right(Both)    Extremity/Trunk Assessment   Upper Extremity Assessment Upper Extremity Assessment: Overall WFL for tasks assessed    Lower Extremity Assessment Lower Extremity Assessment: Overall WFL for tasks assessed       Communication   Communication: No difficulties  Cognition Arousal/Alertness: Awake/alert Behavior During Therapy: WFL for tasks assessed/performed Overall Cognitive Status: Within Functional Limits for tasks assessed                                        General Comments      Exercises     Assessment/Plan    PT Assessment Patient needs continued PT services  PT Problem List Decreased balance;Decreased mobility;Decreased strength       PT Treatment Interventions DME instruction;Gait training;Stair training;Functional mobility training;Therapeutic activities;Therapeutic exercise;Balance training;Neuromuscular re-education    PT Goals (Current goals can be found in the Care Plan section)  Acute Rehab PT Goals Patient Stated Goal: Get rid of dizziness PT Goal Formulation: With patient Time For Goal Achievement: 05/29/18 Potential to Achieve Goals: Fair    Frequency Min 2X/week   Barriers to discharge        Co-evaluation               AM-PAC PT "6 Clicks" Daily Activity  Outcome Measure Difficulty turning over in bed (including adjusting bedclothes, sheets and blankets)?: None Difficulty moving from lying on back to sitting on the side of the  bed? : None Difficulty sitting down on and standing up from a chair with arms (e.g., wheelchair, bedside commode, etc,.)?: None Help needed moving to and from a bed to chair (including a wheelchair)?: None Help needed walking in hospital room?: None Help needed climbing 3-5 steps with a railing? : A Little 6 Click Score: 23    End of Session Equipment Utilized During Treatment: Gait belt Activity Tolerance: Patient tolerated treatment well Patient left: in bed;with call bell/phone within reach;with bed alarm set Nurse Communication: Mobility status PT Visit Diagnosis: Unsteadiness on feet (R26.81);Difficulty in walking, not elsewhere classified (R26.2);Dizziness and giddiness (R42)    Time: 4540-9811 PT Time Calculation (min) (ACUTE ONLY): 30 min   Charges:   PT Evaluation $PT Eval Moderate Complexity: 1 Mod PT Treatments $Gait Training: 8-22 mins        Sharalyn Ink Huprich PT, DPT, GCS   Huprich,Jason 05/15/2018, 2:16 PM

## 2018-05-25 ENCOUNTER — Encounter: Payer: Self-pay | Admitting: *Deleted

## 2018-06-08 ENCOUNTER — Encounter
Admission: RE | Disposition: A | Discharge status: Home / Self Care | Payer: Self-pay | Source: Ambulatory Visit | Attending: Ophthalmology

## 2018-06-08 ENCOUNTER — Encounter: Payer: Self-pay | Admitting: *Deleted

## 2018-06-08 ENCOUNTER — Ambulatory Visit
Admission: RE | Admit: 2018-06-08 | Discharge: 2018-06-08 | Disposition: A | Discharge status: Home / Self Care | Payer: Medicare Other | Source: Ambulatory Visit | Attending: Ophthalmology | Admitting: Ophthalmology

## 2018-06-08 ENCOUNTER — Ambulatory Visit: Payer: Medicare Other | Admitting: Anesthesiology

## 2018-06-08 DIAGNOSIS — I10 Essential (primary) hypertension: Secondary | ICD-10-CM | POA: Diagnosis not present

## 2018-06-08 DIAGNOSIS — Z79899 Other long term (current) drug therapy: Secondary | ICD-10-CM | POA: Diagnosis not present

## 2018-06-08 DIAGNOSIS — H2512 Age-related nuclear cataract, left eye: Secondary | ICD-10-CM | POA: Diagnosis not present

## 2018-06-08 DIAGNOSIS — E1136 Type 2 diabetes mellitus with diabetic cataract: Secondary | ICD-10-CM | POA: Insufficient documentation

## 2018-06-08 DIAGNOSIS — E7800 Pure hypercholesterolemia, unspecified: Secondary | ICD-10-CM | POA: Insufficient documentation

## 2018-06-08 DIAGNOSIS — M1990 Unspecified osteoarthritis, unspecified site: Secondary | ICD-10-CM | POA: Insufficient documentation

## 2018-06-08 DIAGNOSIS — Z7982 Long term (current) use of aspirin: Secondary | ICD-10-CM | POA: Diagnosis not present

## 2018-06-08 DIAGNOSIS — Z7984 Long term (current) use of oral hypoglycemic drugs: Secondary | ICD-10-CM | POA: Diagnosis not present

## 2018-06-08 HISTORY — DX: Dizziness and giddiness: R42

## 2018-06-08 HISTORY — DX: Acute myocardial infarction, unspecified: I21.9

## 2018-06-08 HISTORY — PX: CATARACT EXTRACTION W/PHACO: SHX586

## 2018-06-08 LAB — GLUCOSE, CAPILLARY: Glucose-Capillary: 95 mg/dL (ref 70–99)

## 2018-06-08 SURGERY — PHACOEMULSIFICATION, CATARACT, WITH IOL INSERTION
Anesthesia: Monitor Anesthesia Care | Site: Eye | Laterality: Left

## 2018-06-08 MED ORDER — ARMC OPHTHALMIC DILATING DROPS
1.0000 "application " | OPHTHALMIC | Status: AC
  Administered 2018-06-08 (×2): 1 via OPHTHALMIC
  Administered 2018-06-08: 07:00:00 via OPHTHALMIC

## 2018-06-08 MED ORDER — POVIDONE-IODINE 5 % OP SOLN
OPHTHALMIC | Status: DC | PRN
  Administered 2018-06-08: 1 via OPHTHALMIC

## 2018-06-08 MED ORDER — ARMC OPHTHALMIC DILATING DROPS
OPHTHALMIC | Status: AC
  Filled 2018-06-08: qty 0.5

## 2018-06-08 MED ORDER — LIDOCAINE HCL (PF) 4 % IJ SOLN
INTRAOCULAR | Status: DC | PRN
  Administered 2018-06-08: 2 mL via OPHTHALMIC

## 2018-06-08 MED ORDER — EPINEPHRINE PF 1 MG/ML IJ SOLN
INTRAMUSCULAR | Status: AC
  Filled 2018-06-08: qty 1

## 2018-06-08 MED ORDER — MOXIFLOXACIN HCL 0.5 % OP SOLN
OPHTHALMIC | Status: AC
  Administered 2018-06-08: 1 [drp] via OPHTHALMIC
  Filled 2018-06-08: qty 3

## 2018-06-08 MED ORDER — FENTANYL CITRATE (PF) 100 MCG/2ML IJ SOLN
INTRAMUSCULAR | Status: DC | PRN
  Administered 2018-06-08 (×2): 25 ug via INTRAVENOUS

## 2018-06-08 MED ORDER — POVIDONE-IODINE 5 % OP SOLN
OPHTHALMIC | Status: AC
  Filled 2018-06-08: qty 30

## 2018-06-08 MED ORDER — LIDOCAINE HCL (PF) 4 % IJ SOLN
INTRAMUSCULAR | Status: AC
  Filled 2018-06-08: qty 5

## 2018-06-08 MED ORDER — NEOMYCIN-POLYMYXIN-DEXAMETH 0.1 % OP OINT
TOPICAL_OINTMENT | OPHTHALMIC | Status: DC | PRN
  Administered 2018-06-08: 1 via OPHTHALMIC

## 2018-06-08 MED ORDER — FENTANYL CITRATE (PF) 100 MCG/2ML IJ SOLN
INTRAMUSCULAR | Status: AC
  Filled 2018-06-08: qty 2

## 2018-06-08 MED ORDER — MOXIFLOXACIN HCL 0.5 % OP SOLN
1.0000 [drp] | OPHTHALMIC | Status: AC
  Administered 2018-06-08 (×3): 1 [drp] via OPHTHALMIC

## 2018-06-08 MED ORDER — NEOMYCIN-POLYMYXIN-DEXAMETH 3.5-10000-0.1 OP OINT
TOPICAL_OINTMENT | OPHTHALMIC | Status: AC
  Filled 2018-06-08: qty 3.5

## 2018-06-08 MED ORDER — NA HYALUR & NA CHOND-NA HYALUR 0.55-0.5 ML IO KIT
PACK | INTRAOCULAR | Status: AC
  Filled 2018-06-08: qty 1.05

## 2018-06-08 MED ORDER — EPINEPHRINE PF 1 MG/ML IJ SOLN
INTRAOCULAR | Status: DC | PRN
  Administered 2018-06-08: 1 mL via OPHTHALMIC

## 2018-06-08 MED ORDER — CARBACHOL 0.01 % IO SOLN
INTRAOCULAR | Status: DC | PRN
  Administered 2018-06-08: .5 mL via INTRAOCULAR

## 2018-06-08 MED ORDER — SODIUM CHLORIDE 0.9 % IV SOLN
INTRAVENOUS | Status: DC
  Administered 2018-06-08: 07:00:00 via INTRAVENOUS

## 2018-06-08 MED ORDER — NA HYALUR & NA CHOND-NA HYALUR 0.4-0.35 ML IO KIT
PACK | INTRAOCULAR | Status: DC | PRN
  Administered 2018-06-08: 1 mL via INTRAOCULAR

## 2018-06-08 SURGICAL SUPPLY — 18 items
GLOVE BIO SURGEON STRL SZ8 (GLOVE) ×3 IMPLANT
GLOVE BIOGEL M 6.5 STRL (GLOVE) ×3 IMPLANT
GLOVE SURG LX 7.5 STRW (GLOVE) ×2
GLOVE SURG LX STRL 7.5 STRW (GLOVE) ×1 IMPLANT
GOWN STRL REUS W/ TWL LRG LVL3 (GOWN DISPOSABLE) ×2 IMPLANT
GOWN STRL REUS W/TWL LRG LVL3 (GOWN DISPOSABLE) ×4
LABEL CATARACT MEDS ST (LABEL) ×3 IMPLANT
LENS IOL TECNIS ITEC 21.0 (Intraocular Lens) ×2 IMPLANT
NDL HPO THNWL 1X22GA REG BVL (NEEDLE) ×1 IMPLANT
NEEDLE SAFETY 22GX1 (NEEDLE) ×2
PACK CATARACT (MISCELLANEOUS) ×3 IMPLANT
PACK CATARACT BRASINGTON LX (MISCELLANEOUS) ×3 IMPLANT
PACK EYE AFTER SURG (MISCELLANEOUS) ×3 IMPLANT
SOL BSS BAG (MISCELLANEOUS) ×3
SOLUTION BSS BAG (MISCELLANEOUS) ×1 IMPLANT
SYR 5ML LL (SYRINGE) ×3 IMPLANT
WATER STERILE IRR 250ML POUR (IV SOLUTION) ×3 IMPLANT
WIPE NON LINTING 3.25X3.25 (MISCELLANEOUS) ×3 IMPLANT

## 2018-06-08 NOTE — Anesthesia Post-op Follow-up Note (Signed)
Anesthesia QCDR form completed.        

## 2018-06-08 NOTE — Discharge Instructions (Signed)
° °  Eye Surgery Discharge Instructions    Expect mild scratchy sensation or mild soreness. DO NOT RUB YOUR EYE!  The day of surgery:  Minimal physical activity, but bed rest is not required  No reading, computer work, or close hand work  No bending, lifting, or straining.  May watch TV  For 24 hours:  No driving, legal decisions, or alcoholic beverages  Safety precautions  Eat anything you prefer: It is better to start with liquids, then soup then solid foods.  _____ Eye patch should be worn until postoperative exam tomorrow.  ____ Solar shield eyeglasses should be worn for comfort in the sunlight/patch while sleeping  Resume all regular medications including aspirin or Coumadin if these were discontinued prior to surgery. You may shower, bathe, shave, or wash your hair. Tylenol may be taken for mild discomfort.  Call your doctor if you experience significant pain, nausea, or vomiting, fever > 101 or other signs of infection. [phone] or [phone] Specific instructions:  Follow-up Information    Lockie MolaBrasington, Chadwick, MD Follow up on 06/09/2018.   Specialty:  Ophthalmology Why:  9:00 Contact information: [address]   [place] [id number] [phone]

## 2018-06-08 NOTE — H&P (Signed)
The History and Physical notes are on paper, have been signed, and are to be scanned. The patient remains stable and unchanged from the H&P.   Previous H&P reviewed, patient examined, and there are no changes.  Connie Frederick 06/08/2018 7:30 AM

## 2018-06-08 NOTE — Anesthesia Postprocedure Evaluation (Signed)
Anesthesia Post Note  Patient: Connie Frederick  Procedure(s) Performed: CATARACT EXTRACTION PHACO AND INTRAOCULAR LENS PLACEMENT ([place]) (Left Eye)  Patient location during evaluation: Short Stay Anesthesia Type: MAC Level of consciousness: awake and alert Pain management: pain level controlled Vital Signs Assessment: post-procedure vital signs reviewed and stable Respiratory status: spontaneous breathing, nonlabored ventilation, respiratory function stable and patient connected to nasal cannula oxygen Cardiovascular status: stable and blood pressure returned to baseline Postop Assessment: no apparent nausea or vomiting Anesthetic complications: no     Last Vitals:  Vitals:   06/08/18 0629  BP: (!) 154/70  Pulse: 80  Resp: 20  Temp: 36.4 C  SpO2: 96%    Last Pain:  Vitals:   06/08/18 0629  TempSrc: Temporal                 Zanetta Dehaan,  Clearnce Sorrel

## 2018-06-08 NOTE — Transfer of Care (Signed)
Immediate Anesthesia Transfer of Care Note  Patient: Connie Frederick  Procedure(s) Performed: CATARACT EXTRACTION PHACO AND INTRAOCULAR LENS PLACEMENT ([place]) (Left Eye)  Patient Location: Short Stay  Anesthesia Type:MAC  Level of Consciousness: awake, alert  and oriented  Airway & Oxygen Therapy: Patient Spontanous Breathing  Post-op Assessment: Post -op Vital signs reviewed and stable  Post vital signs: stable  Last Vitals:  Vitals Value Taken Time  BP    Temp    Pulse    Resp    SpO2      Last Pain:  Vitals:   06/08/18 0629  TempSrc: Temporal      Patients Stated Pain Goal: 0 (92/33/00 7622)  Complications: No apparent anesthesia complications

## 2018-06-08 NOTE — Op Note (Signed)
OPERATIVE NOTE  Connie Frederick [id number] 06/08/2018   PREOPERATIVE DIAGNOSIS:  Nuclear sclerotic cataract left eye. H25.12   POSTOPERATIVE DIAGNOSIS:    Nuclear sclerotic cataract left eye.     PROCEDURE:  Phacoemusification with posterior chamber intraocular lens placement of the left eye   LENS:   Implant Name Type Inv. Item Serial No. Manufacturer Lot No. LRB No. Used  LENS IOL DIOP 21.0 - [id number] Intraocular Lens LENS IOL DIOP 21.0 [id number] AMO  Left 1        ULTRASOUND TIME: 13  % of 1 minutes 14seconds, CDE 9.8  SURGEON:  Deirdre Evenerhadwick R. Jaiana Sheffer, MD   ANESTHESIA:  Topical with tetracaine drops and 2% Xylocaine jelly, augmented with 1% preservative-free intracameral lidocaine.    COMPLICATIONS:  None.   DESCRIPTION OF PROCEDURE:  The patient was identified in the holding room and transported to the operating room and placed in the supine position under the operating microscope.  The left eye was identified as the operative eye and it was prepped and draped in the usual sterile ophthalmic fashion.   A 1 millimeter clear-corneal paracentesis was made at the 1:30 position.  0.5 ml of preservative-free 1% lidocaine was injected into the anterior chamber.  The anterior chamber was filled with Viscoat viscoelastic.  A 2.4 millimeter keratome was used to make a near-clear corneal incision at the 10:30 position.  .  A curvilinear capsulorrhexis was made with a cystotome and capsulorrhexis forceps.  Balanced salt solution was used to hydrodissect and hydrodelineate the nucleus.   Phacoemulsification was then used in stop and chop fashion to remove the lens nucleus and epinucleus.  The remaining cortex was then removed using the irrigation and aspiration handpiece. Provisc was then placed into the capsular bag to distend it for lens placement.  A lens was then injected into the capsular bag.  The remaining viscoelastic was aspirated.   Wounds were hydrated with balanced salt  solution.  The anterior chamber was inflated to a physiologic pressure with balanced salt solution.  No wound leaks were noted. Vigamox 0.2 ml of a 1mg  per ml solution was injected into the anterior chamber for a dose of 0.2 mg of intracameral antibiotic at the completion of the case.   Timolol and Brimonidine drops were applied to the eye.  The patient was taken to the recovery room in stable condition without complications of anesthesia or surgery.  [name] 06/08/2018, 8:40 AM

## 2018-06-08 NOTE — Anesthesia Preprocedure Evaluation (Signed)
Anesthesia Evaluation  Patient identified by MRN, date of birth, ID band Patient awake    Reviewed: Allergy & Precautions, H&P , NPO status , Patient's Chart, lab work & pertinent test results, reviewed documented beta blocker date and time   History of Anesthesia Complications Negative for: history of anesthetic complications  Airway Mallampati: III  TM Distance: >3 FB Neck ROM: full    Dental  (+) Upper Dentures, Lower Dentures   Pulmonary neg pulmonary ROS,           Cardiovascular Exercise Tolerance: Good hypertension, (-) angina+ Past MI  (-) CAD, (-) Cardiac Stents and (-) CABG (-) dysrhythmias (-) Valvular Problems/Murmurs     Neuro/Psych negative neurological ROS  negative psych ROS   GI/Hepatic negative GI ROS, Neg liver ROS,   Endo/Other  diabetes, Well Controlled, Oral Hypoglycemic Agents  Renal/GU negative Renal ROS  negative genitourinary   Musculoskeletal   Abdominal   Peds  Hematology negative hematology ROS (+)   Anesthesia Other Findings Past Medical History: No date: Diabetes mellitus without complication (HCC) No date: Hypertension No date: Myocardial infarction ([place]) No date: Vertigo     Comment:  HOSPITALIZED 2 WEEKS AGO   Reproductive/Obstetrics negative OB ROS                             Anesthesia Physical Anesthesia Plan  ASA: III  Anesthesia Plan: MAC   Post-op Pain Management:    Induction:   PONV Risk Score and Plan:   Airway Management Planned: Natural Airway and Nasal Cannula  Additional Equipment:   Intra-op Plan:   Post-operative Plan:   Informed Consent: I have reviewed the patients History and Physical, chart, labs and discussed the procedure including the risks, benefits and alternatives for the proposed anesthesia with the patient or authorized representative who has indicated his/her understanding and acceptance.   Dental Advisory  Given  Plan Discussed with: Anesthesiologist, CRNA and Surgeon  Anesthesia Plan Comments:         Anesthesia Quick Evaluation

## 2018-06-09 ENCOUNTER — Encounter: Payer: Self-pay | Admitting: Ophthalmology

## 2018-06-21 ENCOUNTER — Encounter: Payer: Self-pay | Admitting: *Deleted

## 2018-06-21 ENCOUNTER — Other Ambulatory Visit: Payer: Self-pay

## 2018-06-23 NOTE — Discharge Instructions (Signed)

## 2018-06-28 ENCOUNTER — Encounter
Admission: RE | Disposition: A | Discharge status: Home / Self Care | Payer: Self-pay | Source: Ambulatory Visit | Attending: Ophthalmology

## 2018-06-28 ENCOUNTER — Ambulatory Visit: Payer: Medicare Other | Admitting: Anesthesiology

## 2018-06-28 ENCOUNTER — Ambulatory Visit
Admission: RE | Admit: 2018-06-28 | Discharge: 2018-06-28 | Disposition: A | Discharge status: Home / Self Care | Payer: Medicare Other | Source: Ambulatory Visit | Attending: Ophthalmology | Admitting: Ophthalmology

## 2018-06-28 DIAGNOSIS — E7800 Pure hypercholesterolemia, unspecified: Secondary | ICD-10-CM | POA: Diagnosis not present

## 2018-06-28 DIAGNOSIS — H2511 Age-related nuclear cataract, right eye: Secondary | ICD-10-CM | POA: Insufficient documentation

## 2018-06-28 DIAGNOSIS — E1136 Type 2 diabetes mellitus with diabetic cataract: Secondary | ICD-10-CM | POA: Diagnosis not present

## 2018-06-28 DIAGNOSIS — Z79899 Other long term (current) drug therapy: Secondary | ICD-10-CM | POA: Insufficient documentation

## 2018-06-28 DIAGNOSIS — Z9842 Cataract extraction status, left eye: Secondary | ICD-10-CM | POA: Insufficient documentation

## 2018-06-28 DIAGNOSIS — Z7984 Long term (current) use of oral hypoglycemic drugs: Secondary | ICD-10-CM | POA: Insufficient documentation

## 2018-06-28 DIAGNOSIS — Z90710 Acquired absence of both cervix and uterus: Secondary | ICD-10-CM | POA: Insufficient documentation

## 2018-06-28 DIAGNOSIS — Z7982 Long term (current) use of aspirin: Secondary | ICD-10-CM | POA: Diagnosis not present

## 2018-06-28 DIAGNOSIS — I10 Essential (primary) hypertension: Secondary | ICD-10-CM | POA: Diagnosis not present

## 2018-06-28 DIAGNOSIS — I252 Old myocardial infarction: Secondary | ICD-10-CM | POA: Insufficient documentation

## 2018-06-28 DIAGNOSIS — M1990 Unspecified osteoarthritis, unspecified site: Secondary | ICD-10-CM | POA: Insufficient documentation

## 2018-06-28 DIAGNOSIS — R42 Dizziness and giddiness: Secondary | ICD-10-CM | POA: Diagnosis not present

## 2018-06-28 HISTORY — PX: CATARACT EXTRACTION W/PHACO: SHX586

## 2018-06-28 LAB — GLUCOSE, CAPILLARY
Glucose-Capillary: 98 mg/dL (ref 70–99)
Glucose-Capillary: 97 mg/dL (ref 70–99)

## 2018-06-28 SURGERY — PHACOEMULSIFICATION, CATARACT, WITH IOL INSERTION
Anesthesia: Monitor Anesthesia Care | Site: Eye | Laterality: Right

## 2018-06-28 MED ORDER — MIDAZOLAM HCL 2 MG/2ML IJ SOLN
INTRAMUSCULAR | Status: DC | PRN
  Administered 2018-06-28: 1 mg via INTRAVENOUS

## 2018-06-28 MED ORDER — ARMC OPHTHALMIC DILATING DROPS
1.0000 "application " | OPHTHALMIC | Status: DC | PRN
  Administered 2018-06-28 (×3): 1 via OPHTHALMIC

## 2018-06-28 MED ORDER — LACTATED RINGERS IV SOLN: INTRAVENOUS | Status: DC

## 2018-06-28 MED ORDER — MOXIFLOXACIN HCL 0.5 % OP SOLN
1.0000 [drp] | OPHTHALMIC | Status: DC | PRN
  Administered 2018-06-28 (×3): 1 [drp] via OPHTHALMIC

## 2018-06-28 MED ORDER — EPINEPHRINE PF 1 MG/ML IJ SOLN
INTRAOCULAR | Status: DC | PRN
  Administered 2018-06-28: 69 mL via OPHTHALMIC

## 2018-06-28 MED ORDER — NA HYALUR & NA CHOND-NA HYALUR 0.4-0.35 ML IO KIT
PACK | INTRAOCULAR | Status: DC | PRN
  Administered 2018-06-28: 1 mL via INTRAOCULAR

## 2018-06-28 MED ORDER — BRIMONIDINE TARTRATE-TIMOLOL 0.2-0.5 % OP SOLN
OPHTHALMIC | Status: DC | PRN
  Administered 2018-06-28: 1 [drp] via OPHTHALMIC

## 2018-06-28 MED ORDER — CEFUROXIME OPHTHALMIC INJECTION 1 MG/0.1 ML
INJECTION | OPHTHALMIC | Status: DC | PRN
  Administered 2018-06-28: 0.1 mL via OPHTHALMIC

## 2018-06-28 MED ORDER — LIDOCAINE HCL (PF) 2 % IJ SOLN
INTRAOCULAR | Status: DC | PRN
  Administered 2018-06-28: .5 mL

## 2018-06-28 MED ORDER — FENTANYL CITRATE (PF) 100 MCG/2ML IJ SOLN
INTRAMUSCULAR | Status: DC | PRN
  Administered 2018-06-28: 50 ug via INTRAVENOUS

## 2018-06-28 MED ORDER — TETRACAINE HCL 0.5 % OP SOLN
1.0000 [drp] | OPHTHALMIC | Status: DC | PRN
  Administered 2018-06-28 (×2): 1 [drp] via OPHTHALMIC

## 2018-06-28 SURGICAL SUPPLY — 25 items
CANNULA ANT/CHMB 27GA (MISCELLANEOUS) ×3 IMPLANT
CARTRIDGE ABBOTT (MISCELLANEOUS) IMPLANT
GLOVE SURG LX 7.5 STRW (GLOVE) ×2
GLOVE SURG LX STRL 7.5 STRW (GLOVE) ×1 IMPLANT
GLOVE SURG TRIUMPH 8.0 PF LTX (GLOVE) ×3 IMPLANT
GOWN STRL REUS W/ TWL LRG LVL3 (GOWN DISPOSABLE) ×2 IMPLANT
GOWN STRL REUS W/TWL LRG LVL3 (GOWN DISPOSABLE) ×4
LENS IOL TECNIS ITEC 21.0 (Intraocular Lens) ×3 IMPLANT
MARKER SKIN DUAL TIP RULER LAB (MISCELLANEOUS) ×3 IMPLANT
NDL RETROBULBAR .5 NSTRL (NEEDLE) IMPLANT
NEEDLE FILTER BLUNT 18X 1/2SAF (NEEDLE) ×2
NEEDLE FILTER BLUNT 18X1 1/2 (NEEDLE) ×1 IMPLANT
PACK CATARACT BRASINGTON (MISCELLANEOUS) ×3 IMPLANT
PACK EYE AFTER SURG (MISCELLANEOUS) ×3 IMPLANT
PACK OPTHALMIC (MISCELLANEOUS) ×3 IMPLANT
RING MALYGIN 7.0 (MISCELLANEOUS) IMPLANT
SUT ETHILON 10-0 CS-B-6CS-B-6 (SUTURE)
SUT VICRYL  9 0 (SUTURE)
SUT VICRYL 9 0 (SUTURE) IMPLANT
SUTURE EHLN 10-0 CS-B-6CS-B-6 (SUTURE) IMPLANT
SYR 3ML LL SCALE MARK (SYRINGE) ×3 IMPLANT
SYR 5ML LL (SYRINGE) ×3 IMPLANT
SYR TB 1ML LUER SLIP (SYRINGE) ×3 IMPLANT
WATER STERILE IRR 500ML POUR (IV SOLUTION) ×3 IMPLANT
WIPE NON LINTING 3.25X3.25 (MISCELLANEOUS) ×3 IMPLANT

## 2018-06-28 NOTE — H&P (Signed)
The History and Physical notes are on paper, have been signed, and are to be scanned. The patient remains stable and unchanged from the H&P.   Previous H&P reviewed, patient examined, and there are no changes.  Prabhav Faulkenberry 06/28/2018 7:35 AM   

## 2018-06-28 NOTE — Transfer of Care (Signed)
Immediate Anesthesia Transfer of Care Note  Patient: Connie Frederick  Procedure(s) Performed: CATARACT EXTRACTION PHACO AND INTRAOCULAR LENS PLACEMENT ([facility]) RIGHT DIABETIC (Right Eye)  Patient Location: PACU  Anesthesia Type: MAC  Level of Consciousness: awake, alert  and patient cooperative  Airway and Oxygen Therapy: Patient Spontanous Breathing and Patient connected to supplemental oxygen  Post-op Assessment: Post-op Vital signs reviewed, Patient's Cardiovascular Status Stable, Respiratory Function Stable, Patent Airway and No signs of Nausea or vomiting  Post-op Vital Signs: Reviewed and stable  Complications: No apparent anesthesia complications

## 2018-06-28 NOTE — Anesthesia Postprocedure Evaluation (Signed)
Anesthesia Post Note  Patient: Connie Frederick  Procedure(s) Performed: CATARACT EXTRACTION PHACO AND INTRAOCULAR LENS PLACEMENT (IOC) RIGHT DIABETIC (Right Eye)  Patient location during evaluation: PACU Anesthesia Type: MAC Level of consciousness: awake and alert Pain management: pain level controlled Vital Signs Assessment: post-procedure vital signs reviewed and stable Respiratory status: spontaneous breathing Cardiovascular status: blood pressure returned to baseline Anesthetic complications: no    Jaci Standard, III,  Tekeyah Santiago D

## 2018-06-28 NOTE — Anesthesia Procedure Notes (Signed)
Procedure Name: MAC Date/Time: 06/28/2018 7:38 AM Performed by: Janna Arch, CRNA Pre-anesthesia Checklist: Patient identified, Emergency Drugs available, Suction available, Timeout performed and Patient being monitored Patient Re-evaluated:Patient Re-evaluated prior to induction Oxygen Delivery Method: Nasal cannula Placement Confirmation: positive ETCO2

## 2018-06-28 NOTE — Anesthesia Preprocedure Evaluation (Signed)
Anesthesia Evaluation  Patient identified by MRN, date of birth, ID band Patient awake    Reviewed: Allergy & Precautions, H&P , NPO status , Patient's Chart, lab work & pertinent test results  History of Anesthesia Complications Negative for: history of anesthetic complications  Airway Mallampati: II  TM Distance: >3 FB     Dental no notable dental hx.    Pulmonary neg pulmonary ROS,    Pulmonary exam normal breath sounds clear to auscultation       Cardiovascular hypertension, On Medications + Past MI  Normal cardiovascular exam     Neuro/Psych negative neurological ROS     GI/Hepatic   Endo/Other  diabetes, Well Controlled, Type 2  Renal/GU   negative genitourinary   Musculoskeletal   Abdominal   Peds  Hematology   Anesthesia Other Findings   Reproductive/Obstetrics negative OB ROS                             Anesthesia Physical Anesthesia Plan  ASA: III  Anesthesia Plan: MAC   Post-op Pain Management:    Induction:   PONV Risk Score and Plan:   Airway Management Planned:   Additional Equipment:   Intra-op Plan:   Post-operative Plan:   Informed Consent: I have reviewed the patients History and Physical, chart, labs and discussed the procedure including the risks, benefits and alternatives for the proposed anesthesia with the patient or authorized representative who has indicated his/her understanding and acceptance.     Plan Discussed with:   Anesthesia Plan Comments:         Anesthesia Quick Evaluation

## 2018-06-28 NOTE — Op Note (Signed)
LOCATION:  [facility]   PREOPERATIVE DIAGNOSIS:    Nuclear sclerotic cataract right eye. H25.11   POSTOPERATIVE DIAGNOSIS:  Nuclear sclerotic cataract right eye.     PROCEDURE:  Phacoemusification with posterior chamber intraocular lens placement of the right eye   LENS:   Implant Name Type Inv. Item Serial No. Manufacturer Lot No. LRB No. Used  LENS IOL DIOP 21.0 - [id number] Intraocular Lens LENS IOL DIOP 21.0 [id number]  Right 1        ULTRASOUND TIME: 16 % of 1 minutes, 13 seconds.  CDE 11.7   SURGEON:  Deirdre Evener, MD   ANESTHESIA:  Topical with tetracaine drops and 2% Xylocaine jelly, augmented with 1% preservative-free intracameral lidocaine.    COMPLICATIONS:  None.   DESCRIPTION OF PROCEDURE:  The patient was identified in the holding room and transported to the operating room and placed in the supine position under the operating microscope.  The right eye was identified as the operative eye and it was prepped and draped in the usual sterile ophthalmic fashion.   A 1 millimeter clear-corneal paracentesis was made at the 12:00 position.  0.5 ml of preservative-free 1% lidocaine was injected into the anterior chamber. The anterior chamber was filled with Viscoat viscoelastic.  A 2.4 millimeter keratome was used to make a near-clear corneal incision at the 9:00 position.  A curvilinear capsulorrhexis was made with a cystotome and capsulorrhexis forceps.  Balanced salt solution was used to hydrodissect and hydrodelineate the nucleus.   Phacoemulsification was then used in stop and chop fashion to remove the lens nucleus and epinucleus.  Loose zonules were noted both during hydrodissection and during phacoemulsification with limitation of lens rotation. The remaining cortex was then removed using the irrigation and aspiration handpiece. Provisc was then placed into the capsular bag to distend it for lens placement.  A lens was then injected into the capsular  bag.  The remaining viscoelastic was aspirated.   Wounds were hydrated with balanced salt solution.  The anterior chamber was inflated to a physiologic pressure with balanced salt solution.  No wound leaks were noted. Cefuroxime 0.1 ml of a 10mg /ml solution was injected into the anterior chamber for a dose of 1 mg of intracameral antibiotic at the completion of the case.   Timolol and Brimonidine drops were applied to the eye.  The patient was taken to the recovery room in stable condition without complications of anesthesia or surgery.   Connie Frederick 06/28/2018, 7:58 AM

## 2018-06-29 ENCOUNTER — Encounter: Payer: Self-pay | Admitting: Ophthalmology

## 2018-08-11 ENCOUNTER — Emergency Department: Payer: Medicare Other

## 2018-08-11 ENCOUNTER — Emergency Department
Admission: EM | Admit: 2018-08-11 | Discharge: 2018-08-12 | Disposition: A | Discharge status: Home / Self Care | Payer: Medicare Other | Attending: Emergency Medicine | Admitting: Emergency Medicine

## 2018-08-11 ENCOUNTER — Other Ambulatory Visit: Payer: Self-pay

## 2018-08-11 DIAGNOSIS — R112 Nausea with vomiting, unspecified: Secondary | ICD-10-CM

## 2018-08-11 DIAGNOSIS — Z7982 Long term (current) use of aspirin: Secondary | ICD-10-CM | POA: Diagnosis not present

## 2018-08-11 DIAGNOSIS — Z79899 Other long term (current) drug therapy: Secondary | ICD-10-CM | POA: Insufficient documentation

## 2018-08-11 DIAGNOSIS — I252 Old myocardial infarction: Secondary | ICD-10-CM | POA: Insufficient documentation

## 2018-08-11 DIAGNOSIS — I10 Essential (primary) hypertension: Secondary | ICD-10-CM | POA: Diagnosis not present

## 2018-08-11 DIAGNOSIS — E119 Type 2 diabetes mellitus without complications: Secondary | ICD-10-CM | POA: Diagnosis not present

## 2018-08-11 DIAGNOSIS — Z7984 Long term (current) use of oral hypoglycemic drugs: Secondary | ICD-10-CM | POA: Diagnosis not present

## 2018-08-11 DIAGNOSIS — R1110 Vomiting, unspecified: Secondary | ICD-10-CM | POA: Diagnosis present

## 2018-08-11 LAB — CBC WITH DIFFERENTIAL/PLATELET
ABS IMMATURE GRANULOCYTES: 0.03 10*3/uL (ref 0.00–0.07)
BASOS ABS: 0 10*3/uL (ref 0.0–0.1)
BASOS PCT: 1 %
Eosinophils Absolute: 0.1 10*3/uL (ref 0.0–0.5)
Eosinophils Relative: 1 %
HCT: 38.3 % (ref 36.0–46.0)
HEMOGLOBIN: 12 g/dL (ref 12.0–15.0)
Immature Granulocytes: 1 %
LYMPHS PCT: 39 %
Lymphs Abs: 2.5 10*3/uL (ref 0.7–4.0)
MCH: 27 pg (ref 26.0–34.0)
MCHC: 31.3 g/dL (ref 30.0–36.0)
MCV: 86.1 fL (ref 80.0–100.0)
Monocytes Absolute: 0.6 10*3/uL (ref 0.1–1.0)
Monocytes Relative: 9 %
NRBC: 0 % (ref 0.0–0.2)
Neutro Abs: 3.2 10*3/uL (ref 1.7–7.7)
Neutrophils Relative %: 49 %
Platelets: 207 10*3/uL (ref 150–400)
RBC: 4.45 MIL/uL (ref 3.87–5.11)
RDW: 13.2 % (ref 11.5–15.5)
WBC: 6.4 10*3/uL (ref 4.0–10.5)

## 2018-08-11 LAB — COMPREHENSIVE METABOLIC PANEL
ALT: 21 U/L (ref 0–44)
ANION GAP: 9 (ref 5–15)
AST: 29 U/L (ref 15–41)
Albumin: 4.1 g/dL (ref 3.5–5.0)
Alkaline Phosphatase: 52 U/L (ref 38–126)
BUN: 16 mg/dL (ref 8–23)
CHLORIDE: 108 mmol/L (ref 98–111)
CO2: 27 mmol/L (ref 22–32)
Calcium: 9.4 mg/dL (ref 8.9–10.3)
Creatinine, Ser: 1.05 mg/dL — ABNORMAL HIGH (ref 0.44–1.00)
GFR, EST AFRICAN AMERICAN: 55 mL/min — AB (ref >60)
GFR, EST NON AFRICAN AMERICAN: 48 mL/min — AB (ref >60)
Glucose, Bld: 151 mg/dL — ABNORMAL HIGH (ref 70–99)
POTASSIUM: 3.6 mmol/L (ref 3.5–5.1)
Sodium: 144 mmol/L (ref 135–145)
Total Bilirubin: 0.5 mg/dL (ref 0.3–1.2)
Total Protein: 6.8 g/dL (ref 6.5–8.1)

## 2018-08-11 LAB — TROPONIN I

## 2018-08-11 MED ORDER — ONDANSETRON HCL 4 MG/2ML IJ SOLN
INTRAMUSCULAR | Status: AC
  Filled 2018-08-11: qty 2

## 2018-08-11 MED ORDER — ONDANSETRON HCL 4 MG/2ML IJ SOLN
4.0000 mg | Freq: Once | INTRAMUSCULAR | Status: AC | PRN
  Administered 2018-08-11: 4 mg via INTRAVENOUS

## 2018-08-11 NOTE — ED Triage Notes (Signed)
Pt with emesis x3 since eating shrimp at 1700 today. Pt denies pain, fever, diarrhea. Pt appears in no acute distress.

## 2018-08-11 NOTE — ED Notes (Signed)
Patient transported to X-ray 

## 2018-08-11 NOTE — ED Provider Notes (Signed)
[facility] Emergency Department Provider Note   ____________________________________________   None    (approximate)  I have reviewed the triage vital signs and the nursing notes.   HISTORY  Chief Complaint Emesis    HPI Connie Frederick is a 84 y.o. female who ate shrimp and then had 3 episodes of emesis.  She is worried that she might be like her daughter who had a kink in her bowel she said after having surgery.  This happened from the scar tissue she said.  She has had a hysterectomy 45 years ago.  She is not having any diarrhea or abdominal pain.   Past Medical History:  Diagnosis Date  . Diabetes mellitus without complication (HCC)   . Hypertension   . Myocardial infarction (HCC)   . Vertigo    HOSPITALIZED 2 WEEKS AGO    Patient Active Problem List   Diagnosis Date Noted  . Vertigo 05/14/2018  . Varicose veins of leg with pain, bilateral 03/29/2017  . Swelling of limb 03/29/2017  . Hyperlipidemia 02/22/2017  . Diabetes (HCC) 02/22/2017  . Pain and swelling of lower extremity 02/22/2017    Past Surgical History:  Procedure Laterality Date  . ABDOMINAL HYSTERECTOMY    . CATARACT EXTRACTION W/PHACO Left 06/08/2018   Procedure: CATARACT EXTRACTION PHACO AND INTRAOCULAR LENS PLACEMENT (IOC);  Surgeon: Lockie Mola, MD;  Location: [facility] ORS;  Service: Ophthalmology;  Laterality: Left;  [place] 04:54 AP% 14.4 CDE 13.2 Fluid pack lot # [id number]  . CATARACT EXTRACTION W/PHACO Right 06/28/2018   Procedure: CATARACT EXTRACTION PHACO AND INTRAOCULAR LENS PLACEMENT (IOC) RIGHT DIABETIC;  Surgeon: Lockie Mola, MD;  Location: [facility];  Service: Ophthalmology;  Laterality: Right;  diabetic - oral meds  . VEIN LIGATION      Prior to Admission medications   Medication Sig Start Date End Date Taking? Authorizing Provider  acetaminophen (TYLENOL) 650 MG CR tablet Take 650 mg by mouth every 8 (eight) hours as needed for pain.     [provider]  aspirin EC 81 MG tablet Take 81 mg by mouth daily.    [provider]  Cholecalciferol (D3-1000) 1000 units tablet Take 1,000 Units by mouth daily.    [provider]  diazepam (VALIUM) 2 MG tablet Take 1 tablet (2 mg total) by mouth every 8 (eight) hours as needed (dizziness). Patient not taking: Reported on 06/05/2018 05/15/18 05/15/19  Enid Baas, MD  glipiZIDE (GLUCOTROL XL) 2.5 MG 24 hr tablet Take 2.5 mg by mouth daily. 08/25/15   [provider]  losartan (COZAAR) 100 MG tablet Take 100 mg by mouth daily. 08/11/15   [provider]  meclizine (ANTIVERT) 25 MG tablet Take 1 tablet (25 mg total) by mouth 3 (three) times daily as needed for dizziness. Patient not taking: Reported on 06/05/2018 05/15/18   Enid Baas, MD  ondansetron (ZOFRAN ODT) 4 MG disintegrating tablet Take 1 tablet (4 mg total) by mouth every 8 (eight) hours as needed for nausea or vomiting. 08/12/18   Arnaldo Natal, MD  polyethylene glycol powder (GLYCOLAX/MIRALAX) powder Take 17 g by mouth daily as needed for mild constipation.  10/14/15   [provider]  promethazine (PHENERGAN) 12.5 MG tablet Take 1 tablet (12.5 mg total) by mouth every 8 (eight) hours as needed for nausea or vomiting. Patient not taking: Reported on 06/05/2018 05/15/18   Enid Baas, MD  rosuvastatin (CRESTOR) 5 MG tablet Take 5 mg by mouth daily.  [provider]    Allergies Patient has no known allergies.  Family History  Problem Relation Age of Onset  . Hypertension Brother     Social History Social History   Tobacco Use  . Smoking status: Never Smoker  . Smokeless tobacco: Never Used  Substance Use Topics  . Alcohol use: No  . Drug use: No    Review of Systems  Constitutional: No fever/chills Eyes: No visual changes. ENT: No sore throat. Cardiovascular: Denies chest pain. Respiratory: Denies shortness of  breath. Gastrointestinal: No abdominal pain.   nausea,  vomiting.  No diarrhea.  No constipation. Genitourinary: Negative for dysuria. Musculoskeletal: Negative for back pain. Skin: Negative for rash. Neurological: Negative for headaches, focal weakness   ____________________________________________   PHYSICAL EXAM:  VITAL SIGNS: ED Triage Vitals  Enc Vitals Group     BP 08/11/18 2245 (!) 160/78     Pulse --      Resp 08/11/18 2243 16     Temp 08/11/18 2245 98.1 F (36.7 C)     Temp Source 08/11/18 2245 Oral     SpO2 08/11/18 2245 96 %     Weight 08/11/18 2243 175 lb (79.4 kg)     Height 08/11/18 2243 5' (1.524 m)     Head Circumference --      Peak Flow --      Pain Score 08/11/18 2243 0     Pain Loc --      Pain Edu? --      Excl. in GC? --     Constitutional: Alert and oriented. Well appearing and in no acute distress. Eyes: Conjunctivae are normal.  Head: Atraumatic. Nose: No congestion/rhinnorhea. Mouth/Throat: Mucous membranes are moist.  Oropharynx non-erythematous. Neck: No stridor.  Cardiovascular: Normal rate, regular rhythm. Grossly normal heart sounds.  Good peripheral circulation. Respiratory: Normal respiratory effort.  No retractions. Lungs CTAB. Gastrointestinal: Soft and nontender. No distention. No abdominal bruits. No CVA tenderness. Musculoskeletal: No lower extremity tenderness nor edema.  Neurologic:  Normal speech and language. No gross focal neurologic deficits are appreciated.  Skin:  Skin is warm, dry and intact. No rash noted. Psychiatric: Mood and affect are normal. Speech and behavior are normal.  ____________________________________________   LABS (all labs ordered are listed, but only abnormal results are displayed)  Labs Reviewed  COMPREHENSIVE METABOLIC PANEL - Abnormal; Notable for the following components:      Result Value   Glucose, Bld 151 (*)    Creatinine, Ser 1.05 (*)    GFR calc non Af Amer 48 (*)    GFR calc Af Amer  55 (*)    All other components within normal limits  CBC WITH DIFFERENTIAL/PLATELET  TROPONIN I   ____________________________________________  EKG  KG read interpreted by me shows normal sinus rhythm rate of 82 left axis decreased R wave progression nonspecific ST-T wave changes ____________________________________________  RADIOLOGY  ED MD interpretation: X-ray read by radiology reviewed by me shows no acute pathology  Official radiology report(s): Dg Abdomen Acute W/chest  Result Date: 08/11/2018 CLINICAL DATA:  Vomiting since eating shrimp at 1700 hours today. EXAM: DG ABDOMEN ACUTE W/ 1V CHEST COMPARISON:  Chest 03/15/2018 FINDINGS: Normal heart size and pulmonary vascularity. No focal airspace disease or consolidation in the lungs. No blunting of costophrenic angles. No pneumothorax. Mediastinal contours appear intact. Degenerative changes in the spine and shoulders. Gas and stool throughout the colon. No small or large bowel distention. No free intra-abdominal air. No abnormal air-fluid levels.  Calcifications in the right upper quadrant consistent with gallstones. Calcified phleboliths in the pelvis. Calcification to the left of L2-3 may represent a renal stone. Degenerative changes in the spine and hips. IMPRESSION: No evidence of active pulmonary disease. Nonobstructive bowel gas pattern. Cholelithiasis. Probable left renal stone. Electronically Signed   By: Burman Nieves M.D.   On: 08/11/2018 23:36    ____________________________________________   PROCEDURES  Procedure(s) performed:   Procedures  Critical Care performed:   ____________________________________________   INITIAL IMPRESSION / ASSESSMENT AND PLAN / ED COURSE  Patient feeling well now no further nausea or vomiting no abdominal pain no chest pain I will let her go.      ____________________________________________   FINAL CLINICAL IMPRESSION(S) / ED DIAGNOSES  Final diagnoses:  Non-intractable  vomiting with nausea, unspecified vomiting type     ED Discharge Orders         Ordered    ondansetron (ZOFRAN ODT) 4 MG disintegrating tablet  Every 8 hours PRN     08/12/18 0010           Note:  This document was prepared using Dragon voice recognition software and may include unintentional dictation errors.    Arnaldo Natal, MD 08/12/18 Burna Mortimer

## 2018-08-12 MED ORDER — ONDANSETRON 4 MG PO TBDP: 4.0000 mg | ORAL_TABLET | Freq: Three times a day (TID) | ORAL | 0 refills | Status: DC | PRN

## 2018-08-12 NOTE — Discharge Instructions (Signed)
Please use the Zofran melt on your tongue wafers 1 3 times a day if needed for nausea or vomiting.  If they do not work or if you begin vomiting tonight again please return.  Also please return for any bad abdominal pain chest pain or anything else unusual.

## 2018-10-10 ENCOUNTER — Encounter: Payer: Self-pay | Admitting: Podiatry

## 2018-10-10 ENCOUNTER — Ambulatory Visit (INDEPENDENT_AMBULATORY_CARE_PROVIDER_SITE_OTHER): Payer: Medicare Other | Admitting: Podiatry

## 2018-10-10 VITALS — BP 174/88 | HR 93

## 2018-10-10 DIAGNOSIS — M79676 Pain in unspecified toe(s): Secondary | ICD-10-CM | POA: Diagnosis not present

## 2018-10-10 DIAGNOSIS — E0843 Diabetes mellitus due to underlying condition with diabetic autonomic (poly)neuropathy: Secondary | ICD-10-CM

## 2018-10-10 DIAGNOSIS — B351 Tinea unguium: Secondary | ICD-10-CM

## 2018-10-17 NOTE — Progress Notes (Signed)
   SUBJECTIVE Patient with a history of diabetes mellitus presents to office today complaining of elongated, thickened nails that cause pain while ambulating in shoes. She is unable to trim her own nails. Patient is here for further evaluation and treatment.   Past Medical History:  Diagnosis Date  . Diabetes mellitus without complication (HCC)   . Hypertension   . Myocardial infarction (HCC)   . Vertigo    HOSPITALIZED 2 WEEKS AGO    OBJECTIVE General Patient is awake, alert, and oriented x 3 and in no acute distress. Derm Skin is dry and supple bilateral. Negative open lesions or macerations. Remaining integument unremarkable. Nails are tender, long, thickened and dystrophic with subungual debris, consistent with onychomycosis, 1-5 bilateral. No signs of infection noted. Vasc  DP and PT pedal pulses palpable bilaterally. Temperature gradient within normal limits.  Neuro Epicritic and protective threshold sensation diminished bilaterally.  Musculoskeletal Exam No symptomatic pedal deformities noted bilateral. Muscular strength within normal limits.  ASSESSMENT 1. Diabetes Mellitus w/ peripheral neuropathy 2. Onychomycosis of nail due to dermatophyte bilateral 3. Pain in foot bilateral  PLAN OF CARE 1. Patient evaluated today. 2. Instructed to maintain good pedal hygiene and foot care. Stressed importance of controlling blood sugar.  3. Mechanical debridement of nails 1-5 bilaterally performed using a nail nipper. Filed with dremel without incident.  4. Return to clinic in 3 mos.     Felecia Shelling, DPM [facility]  Dr. Felecia Shelling, DPM    [address]                                        [place], [place] [address]                Office [phone]  Fax [phone]

## 2019-01-11 ENCOUNTER — Encounter: Payer: Self-pay | Admitting: Podiatry

## 2019-01-11 ENCOUNTER — Ambulatory Visit (INDEPENDENT_AMBULATORY_CARE_PROVIDER_SITE_OTHER): Payer: Medicare Other | Admitting: Podiatry

## 2019-01-11 ENCOUNTER — Other Ambulatory Visit: Payer: Self-pay

## 2019-01-11 ENCOUNTER — Ambulatory Visit: Payer: Medicare Other | Admitting: Podiatry

## 2019-01-11 VITALS — Temp 97.5°F

## 2019-01-11 DIAGNOSIS — M79676 Pain in unspecified toe(s): Secondary | ICD-10-CM | POA: Diagnosis not present

## 2019-01-11 DIAGNOSIS — L608 Other nail disorders: Secondary | ICD-10-CM

## 2019-01-11 DIAGNOSIS — E0843 Diabetes mellitus due to underlying condition with diabetic autonomic (poly)neuropathy: Secondary | ICD-10-CM | POA: Diagnosis not present

## 2019-01-11 DIAGNOSIS — B351 Tinea unguium: Secondary | ICD-10-CM

## 2019-01-11 NOTE — Progress Notes (Addendum)
Complaint:  Visit Type: Patient returns to my office for continued preventative foot care services. Complaint: Patient states" my nails have grown long and thick and become painful to walk and wear shoes" Patient has been diagnosed with DM with neuropathy.. The patient presents for preventative foot care services. No changes to ROS  Podiatric Exam: Vascular: dorsalis pedis and posterior tibial pulses are weakly  palpable bilateral. Capillary return is immediate. Temperature gradient is WNL. Skin turgor WNL  Sensorium: Diminished  Semmes Weinstein monofilament test. Normal tactile sensation bilaterally. Nail Exam: Pt has thick disfigured discolored nails with subungual debris noted bilateral entire nail hallux through fifth toenails.  Pincer nails  B/l. Ulcer Exam: There is no evidence of ulcer or pre-ulcerative changes or infection. Orthopedic Exam: Muscle tone and strength are WNL. No limitations in general ROM. No crepitus or effusions noted. Foot type and digits show no abnormalities. 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 Skin: No Porokeratosis. No infection or ulcers  Diagnosis:  Onychomycosis, , Pain in right toe, pain in left toes  Treatment & Plan Procedures and Treatment: Consent by patient was obtained for treatment procedures.   Debridement of mycotic and hypertrophic toenails, 1 through 5 bilateral and clearing of subungual debris. No ulceration, no infection noted.  Return Visit-Office Procedure: Patient instructed to return to the office for a follow up visit 3 months for continued evaluation and treatment.    Esaiah Wanless DPM 

## 2019-03-27 ENCOUNTER — Other Ambulatory Visit: Payer: Self-pay

## 2019-03-27 ENCOUNTER — Encounter: Payer: Self-pay | Admitting: Emergency Medicine

## 2019-03-27 ENCOUNTER — Emergency Department
Admission: EM | Admit: 2019-03-27 | Discharge: 2019-03-27 | Disposition: A | Discharge status: Home / Self Care | Payer: Medicare Other | Attending: Student in an Organized Health Care Education/Training Program | Admitting: Student in an Organized Health Care Education/Training Program

## 2019-03-27 DIAGNOSIS — R42 Dizziness and giddiness: Secondary | ICD-10-CM | POA: Insufficient documentation

## 2019-03-27 DIAGNOSIS — Z7984 Long term (current) use of oral hypoglycemic drugs: Secondary | ICD-10-CM | POA: Insufficient documentation

## 2019-03-27 DIAGNOSIS — Z79899 Other long term (current) drug therapy: Secondary | ICD-10-CM | POA: Diagnosis not present

## 2019-03-27 DIAGNOSIS — E119 Type 2 diabetes mellitus without complications: Secondary | ICD-10-CM | POA: Insufficient documentation

## 2019-03-27 DIAGNOSIS — I10 Essential (primary) hypertension: Secondary | ICD-10-CM | POA: Diagnosis not present

## 2019-03-27 DIAGNOSIS — Z7982 Long term (current) use of aspirin: Secondary | ICD-10-CM | POA: Insufficient documentation

## 2019-03-27 LAB — COMPREHENSIVE METABOLIC PANEL
ALT: 17 U/L (ref 0–44)
AST: 25 U/L (ref 15–41)
Albumin: 3.8 g/dL (ref 3.5–5.0)
Alkaline Phosphatase: 58 U/L (ref 38–126)
Anion gap: 9 (ref 5–15)
BUN: 16 mg/dL (ref 8–23)
CO2: 22 mmol/L (ref 22–32)
Calcium: 9.2 mg/dL (ref 8.9–10.3)
Chloride: 109 mmol/L (ref 98–111)
Creatinine, Ser: 0.95 mg/dL (ref 0.44–1.00)
GFR calc Af Amer: >60 mL/min (ref >60)
GFR calc non Af Amer: 55 mL/min — ABNORMAL LOW (ref >60)
Glucose, Bld: 153 mg/dL — ABNORMAL HIGH (ref 70–99)
Potassium: 3.8 mmol/L (ref 3.5–5.1)
Sodium: 140 mmol/L (ref 135–145)
Total Bilirubin: 0.6 mg/dL (ref 0.3–1.2)
Total Protein: 6.7 g/dL (ref 6.5–8.1)

## 2019-03-27 LAB — URINALYSIS, COMPLETE (UACMP) WITH MICROSCOPIC
Bacteria, UA: NONE SEEN
Bilirubin Urine: NEGATIVE
Glucose, UA: NEGATIVE mg/dL
Hgb urine dipstick: NEGATIVE
Ketones, ur: NEGATIVE mg/dL
Leukocytes,Ua: NEGATIVE
Nitrite: NEGATIVE
Protein, ur: NEGATIVE mg/dL
Specific Gravity, Urine: 1.003 — ABNORMAL LOW (ref 1.005–1.030)
pH: 7 (ref 5.0–8.0)

## 2019-03-27 LAB — CBC WITH DIFFERENTIAL/PLATELET
Abs Immature Granulocytes: 0.01 10*3/uL (ref 0.00–0.07)
Basophils Absolute: 0 10*3/uL (ref 0.0–0.1)
Basophils Relative: 0 %
Eosinophils Absolute: 0.1 10*3/uL (ref 0.0–0.5)
Eosinophils Relative: 2 %
HCT: 38.1 % (ref 36.0–46.0)
Hemoglobin: 12 g/dL (ref 12.0–15.0)
Immature Granulocytes: 0 %
Lymphocytes Relative: 38 %
Lymphs Abs: 2.1 10*3/uL (ref 0.7–4.0)
MCH: 27.1 pg (ref 26.0–34.0)
MCHC: 31.5 g/dL (ref 30.0–36.0)
MCV: 86 fL (ref 80.0–100.0)
Monocytes Absolute: 0.4 10*3/uL (ref 0.1–1.0)
Monocytes Relative: 7 %
Neutro Abs: 3 10*3/uL (ref 1.7–7.7)
Neutrophils Relative %: 53 %
Platelets: 187 10*3/uL (ref 150–400)
RBC: 4.43 MIL/uL (ref 3.87–5.11)
RDW: 13.3 % (ref 11.5–15.5)
WBC: 5.5 10*3/uL (ref 4.0–10.5)
nRBC: 0 % (ref 0.0–0.2)

## 2019-03-27 MED ORDER — MECLIZINE HCL 25 MG PO TABS
25.0000 mg | ORAL_TABLET | Freq: Once | ORAL | Status: AC
  Administered 2019-03-27: 12:00:00 25 mg via ORAL
  Filled 2019-03-27: qty 1

## 2019-03-27 MED ORDER — SODIUM CHLORIDE 0.9 % IV BOLUS
250.0000 mL | Freq: Once | INTRAVENOUS | Status: AC
  Administered 2019-03-27: 250 mL via INTRAVENOUS

## 2019-03-27 MED ORDER — MECLIZINE HCL 25 MG PO TABS: 25.0000 mg | ORAL_TABLET | Freq: Three times a day (TID) | ORAL | 0 refills | Status: DC | PRN

## 2019-03-27 NOTE — ED Provider Notes (Signed)
[facility] Emergency Department Provider Note    First MD Initiated Contact with Patient 03/27/19 1103     (approximate)  I have reviewed the triage vital signs and the nursing notes.   HISTORY  Chief Complaint Dizziness    HPI Connie Frederick is a 85 y.o. female with the below listed past medical history presents the ER for dizziness.  Says that she got up this morning when getting out of bed started feeling lightheaded as if the room was spinning.  She then walked over the bathroom and anytime she moved her change direction felt like the room was spinning again.  Symptoms did improve after rest.  States that she is also concerned because she is been having tingling in the bottoms of her feet over the past several weeks.  States that she has diabetes and is worried that that she is developing neuropathy.  Denies any other pain symptoms or discomfort.  She did not take any meclizine this morning.    Past Medical History:  Diagnosis Date  . Diabetes mellitus without complication (HCC)   . Hypertension   . Myocardial infarction (HCC)   . Vertigo    HOSPITALIZED 2 WEEKS AGO   Family History  Problem Relation Age of Onset  . Hypertension Brother    Past Surgical History:  Procedure Laterality Date  . ABDOMINAL HYSTERECTOMY    . CATARACT EXTRACTION W/PHACO Left 06/08/2018   Procedure: CATARACT EXTRACTION PHACO AND INTRAOCULAR LENS PLACEMENT (IOC);  Surgeon: Lockie MolaBrasington, Chadwick, MD;  Location: [facility] ORS;  Service: Ophthalmology;  Laterality: Left;  US 04:54 AP% 14.4 CDE 13.2 Fluid pack lot # [id number]  . CATARACT EXTRACTION W/PHACO Right 06/28/2018   Procedure: CATARACT EXTRACTION PHACO AND INTRAOCULAR LENS PLACEMENT (IOC) RIGHT DIABETIC;  Surgeon: Lockie MolaBrasington, Chadwick, MD;  [facility];  Service: Ophthalmology;  Laterality: Right;  diabetic - oral meds  . VEIN LIGATION     Patient Active Problem List   Diagnosis Date Noted  . Vertigo  05/14/2018  . Varicose veins of leg with pain, bilateral 03/29/2017  . Swelling of limb 03/29/2017  . Hyperlipidemia 02/22/2017  . Diabetes (HCC) 02/22/2017  . Pain and swelling of lower extremity 02/22/2017      Prior to Admission medications   Medication Sig Start Date End Date Taking? Authorizing Provider  acetaminophen (TYLENOL) 650 MG CR tablet Take 650 mg by mouth every 8 (eight) hours as needed for pain.    [provider]  aspirin EC 81 MG tablet Take 81 mg by mouth daily.    [provider]  Cholecalciferol (D3-1000) 1000 units tablet Take 1,000 Units by mouth daily.    [provider]  diazepam (VALIUM) 2 MG tablet Take 1 tablet (2 mg total) by mouth every 8 (eight) hours as needed (dizziness). 05/15/18 05/15/19  Enid BaasKalisetti, Radhika, MD  gabapentin (NEURONTIN) 100 MG capsule  11/10/18   [provider]  glipiZIDE (GLUCOTROL XL) 2.5 MG 24 hr tablet Take 2.5 mg by mouth daily. 08/25/15   [provider]  losartan (COZAAR) 100 MG tablet Take 100 mg by mouth daily. 08/11/15   [provider]  meclizine (ANTIVERT) 25 MG tablet Take 1 tablet (25 mg total) by mouth 3 (three) times daily as needed for dizziness. 03/27/19   Willy Eddyobinson, Faatimah Spielberg, MD  ondansetron (ZOFRAN ODT) 4 MG disintegrating tablet Take 1 tablet (4 mg total) by mouth every 8 (eight) hours as needed for nausea or vomiting. 08/12/18  Nena Polio, MD  polyethylene glycol powder (GLYCOLAX/MIRALAX) powder Take 17 g by mouth daily as needed for mild constipation.  10/14/15   [provider]  promethazine (PHENERGAN) 12.5 MG tablet Take 1 tablet (12.5 mg total) by mouth every 8 (eight) hours as needed for nausea or vomiting. 05/15/18   Gladstone Lighter, MD  rosuvastatin (CRESTOR) 5 MG tablet Take 5 mg by mouth daily.    [provider]    Allergies Patient has no known allergies.    Social History Social History   Tobacco Use  . Smoking status: Never  Smoker  . Smokeless tobacco: Never Used  Substance Use Topics  . Alcohol use: No  . Drug use: No    Review of Systems Patient denies headaches, rhinorrhea, blurry vision, numbness, shortness of breath, chest pain, edema, cough, abdominal pain, nausea, vomiting, diarrhea, dysuria, fevers, rashes or hallucinations unless otherwise stated above in HPI. ____________________________________________   PHYSICAL EXAM:  VITAL SIGNS: Vitals:   03/27/19 1106 03/27/19 1115  BP: (!) 160/68 (!) 153/65  Pulse:  78  Resp:  19  Temp:    SpO2:  96%    Constitutional: Alert and oriented.  Eyes: Conjunctivae are normal.  Head: Atraumatic. Nose: No congestion/rhinnorhea. Mouth/Throat: Mucous membranes are moist.   Neck: No stridor. Painless ROM.  Cardiovascular: Normal rate, regular rhythm. Grossly normal heart sounds.  Good peripheral circulation. Respiratory: Normal respiratory effort.  No retractions. Lungs CTAB. Gastrointestinal: Soft and nontender. No distention. No abdominal bruits. No CVA tenderness. Genitourinary:  Musculoskeletal: No lower extremity tenderness nor edema.  No joint effusions. Neurologic:  CN- intact.  No facial droop, Normal FNF.  Normal heel to shin.  Sensation intact bilaterally. Normal speech and language. No gross focal neurologic deficits are appreciated. No gait instability. Skin:  Skin is warm, dry and intact. No rash noted. Psychiatric: Mood and affect are normal. Speech and behavior are normal.  ____________________________________________   LABS (all labs ordered are listed, but only abnormal results are displayed)  Results for orders placed or performed during the hospital encounter of 03/27/19 (from the past 24 hour(s))  CBC with Differential/Platelet     Status: None   Collection Time: 03/27/19 11:28 AM  Result Value Ref Range   WBC 5.5 4.0 - 10.5 K/uL   RBC 4.43 3.87 - 5.11 MIL/uL   Hemoglobin 12.0 12.0 - 15.0 g/dL   HCT 38.1 36.0 - 46.0 %   MCV  86.0 80.0 - 100.0 fL   MCH 27.1 26.0 - 34.0 pg   MCHC 31.5 30.0 - 36.0 g/dL   RDW 13.3 11.5 - 15.5 %   Platelets 187 150 - 400 K/uL   nRBC 0.0 0.0 - 0.2 %   Neutrophils Relative % 53 %   Neutro Abs 3.0 1.7 - 7.7 K/uL   Lymphocytes Relative 38 %   Lymphs Abs 2.1 0.7 - 4.0 K/uL   Monocytes Relative 7 %   Monocytes Absolute 0.4 0.1 - 1.0 K/uL   Eosinophils Relative 2 %   Eosinophils Absolute 0.1 0.0 - 0.5 K/uL   Basophils Relative 0 %   Basophils Absolute 0.0 0.0 - 0.1 K/uL   Immature Granulocytes 0 %   Abs Immature Granulocytes 0.01 0.00 - 0.07 K/uL  Comprehensive metabolic panel     Status: Abnormal   Collection Time: 03/27/19 11:28 AM  Result Value Ref Range   Sodium 140 135 - 145 mmol/L   Potassium 3.8 3.5 - 5.1 mmol/L   Chloride 109  98 - 111 mmol/L   CO2 22 22 - 32 mmol/L   Glucose, Bld 153 (H) 70 - 99 mg/dL   BUN 16 8 - 23 mg/dL   Creatinine, Ser 1.610.95 0.44 - 1.00 mg/dL   Calcium 9.2 8.9 - 09.610.3 mg/dL   Total Protein 6.7 6.5 - 8.1 g/dL   Albumin 3.8 3.5 - 5.0 g/dL   AST 25 15 - 41 U/L   ALT 17 0 - 44 U/L   Alkaline Phosphatase 58 38 - 126 U/L   Total Bilirubin 0.6 0.3 - 1.2 mg/dL   GFR calc non Af Amer 55 (L) >60 mL/min   GFR calc Af Amer >60 >60 mL/min   Anion gap 9 5 - 15  Urinalysis, Complete w Microscopic     Status: Abnormal   Collection Time: 03/27/19 11:28 AM  Result Value Ref Range   Color, Urine STRAW (A) YELLOW   APPearance CLEAR (A) CLEAR   Specific Gravity, Urine 1.003 (L) 1.005 - 1.030   pH 7.0 5.0 - 8.0   Glucose, UA NEGATIVE NEGATIVE mg/dL   Hgb urine dipstick NEGATIVE NEGATIVE   Bilirubin Urine NEGATIVE NEGATIVE   Ketones, ur NEGATIVE NEGATIVE mg/dL   Protein, ur NEGATIVE NEGATIVE mg/dL   Nitrite NEGATIVE NEGATIVE   Leukocytes,Ua NEGATIVE NEGATIVE   RBC / HPF 0-5 0 - 5 RBC/hpf   WBC, UA 0-5 0 - 5 WBC/hpf   Bacteria, UA NONE SEEN NONE SEEN   Squamous Epithelial / LPF 0-5 0 - 5   Mucus PRESENT    ____________________________________________   EKG My review and personal interpretation at Time: 11:01   Indication: dizziness  Rate: 80  Rhythm: sinus Axis: normal Other: normal intervals, no stemi ____________________________________________  RADIOLOGY   ____________________________________________   PROCEDURES  Procedure(s) performed:  Procedures    Critical Care performed: no ____________________________________________   INITIAL IMPRESSION / ASSESSMENT AND PLAN / ED COURSE  Pertinent labs & imaging results that were available during my care of the patient were reviewed by me and considered in my medical decision making (see chart for details).   DDX: vertigo, dehyadtion, orthostasism, electrolyte abn, uti, doubt cva  Connie Frederick is a 85 y.o. who presents to the ED with history of vertigo presents ER with similar symptoms.  She is well-appearing in no acute distress.  Seems also be concerned about neuropathy symptoms that she has been developing.  Does not have any focal neuro deficits.  Has benign hints exam.  Do not feel that neuro imaging clinically indicated at this time.  Will give fluids will check blood work as well as urinalysis and give meclizine and reassess.   Clinical Course as of Mar 26 1345  Tue Mar 27, 2019  1333 Patient with significant improvement in symptoms.  Ambulating about the room with no vertiginous symptoms.  This not consistent with CVA more likely vertigo possible component of dehydration.  At this point do believe she stable and appropriate for outpatient follow-up.   [PR]    Clinical Course User Index [PR] Willy Eddyobinson, Christoph Copelan, MD    The patient was evaluated in Emergency Department today for the symptoms described in the history of present illness. He/she was evaluated in the context of the global COVID-19 pandemic, which necessitated consideration that the patient might be at risk for infection with the SARS-CoV-2 virus that causes COVID-19. Institutional protocols and algorithms that  pertain to the evaluation of patients at risk for COVID-19 are in a state of rapid change  based on information released by regulatory bodies including the CDC and federal and state organizations. These policies and algorithms were followed during the patient's care in the ED.  As part of my medical decision making, I reviewed the following data within the electronic MEDICAL RECORD NUMBER Nursing notes reviewed and incorporated, Labs reviewed, notes from prior ED visits and [place] Controlled Substance Database   ____________________________________________   FINAL CLINICAL IMPRESSION(S) / ED DIAGNOSES  Final diagnoses:  Dizziness      NEW MEDICATIONS STARTED DURING THIS VISIT:  Current Discharge Medication List       Note:  This document was prepared using Dragon voice recognition software and may include unintentional dictation errors.    Willy Eddyobinson, Lendon George, MD 03/27/19 1346

## 2019-03-27 NOTE — ED Triage Notes (Signed)
Patient presents to the ED with dizziness that began this am when patient woke up.  Patient states she got up and went to the kitchen and, "the room started spinning."  Patient states dizziness is worse with standing.  Patient states this feels different from her vertigo that she has had before.  Patient denies weakness.  Patient reports bilateral numbness/tingling in lower legs x 1 week.  Patient states, "I have a lot of medical books at home and I looked it up and it sounds like neuropathy."  Patient is alert and oriented x 4 and very pleasant.  Patient lives alone at home.

## 2019-04-03 ENCOUNTER — Ambulatory Visit (INDEPENDENT_AMBULATORY_CARE_PROVIDER_SITE_OTHER): Payer: Medicare Other | Admitting: Podiatry

## 2019-04-03 ENCOUNTER — Ambulatory Visit (INDEPENDENT_AMBULATORY_CARE_PROVIDER_SITE_OTHER): Payer: Medicare Other

## 2019-04-03 ENCOUNTER — Other Ambulatory Visit: Payer: Self-pay

## 2019-04-03 ENCOUNTER — Encounter: Payer: Self-pay | Admitting: Podiatry

## 2019-04-03 VITALS — BP 123/77 | Temp 97.9°F

## 2019-04-03 DIAGNOSIS — G629 Polyneuropathy, unspecified: Secondary | ICD-10-CM

## 2019-04-03 DIAGNOSIS — E0843 Diabetes mellitus due to underlying condition with diabetic autonomic (poly)neuropathy: Secondary | ICD-10-CM | POA: Diagnosis not present

## 2019-04-03 MED ORDER — GABAPENTIN 100 MG PO CAPS: 100.0000 mg | ORAL_CAPSULE | Freq: Three times a day (TID) | ORAL | 2 refills | Status: DC

## 2019-04-05 NOTE — Progress Notes (Signed)
   HPI: 85 y.o. female with PMHx of T2DM presenting today with a chief complaint of cramping, burning and tingling pain of the bilateral feet and legs that has been ongoing for the past few months. She states the pain is worse at night. She was referred by Dr. Prudence Davidson to be checked for neuropathy. She has not had any recent treatment for her symptoms. Patient is here for further evaluation and treatment.   Past Medical History:  Diagnosis Date  . Diabetes mellitus without complication ([place])   . Hypertension   . Myocardial infarction ([place])   . Vertigo    HOSPITALIZED 2 WEEKS AGO     Physical Exam: General: The patient is alert and oriented x3 in no acute distress.  Dermatology: Skin is warm, dry and supple bilateral lower extremities. Negative for open lesions or macerations.  Vascular: Palpable pedal pulses bilaterally. No edema or erythema noted. Capillary refill within normal limits.  Neurological: Epicritic and protective threshold diminished bilaterally.   Musculoskeletal Exam: Range of motion within normal limits to all pedal and ankle joints bilateral. Muscle strength 5/5 in all groups bilateral.   Radiographic Exam:  Normal osseous mineralization. Joint spaces preserved. No fracture/dislocation/boney destruction.    Assessment: 1. T2DM with peripheral polyneuropathy    Plan of Care:  1. Patient evaluated. X-Rays reviewed.  2. Prescription for Gabapentin 100 mg TID provided to patient.  3. Continue wearing good shoe gear.  4. Return to clinic at next scheduled appointment for routine care.       Edrick Kins, DPM [facility]  Dr. Edrick Kins, DPM    [address], [place] [address]                Office [phone]  Fax [phone]

## 2019-04-12 ENCOUNTER — Ambulatory Visit: Payer: Medicare Other | Admitting: Podiatry

## 2019-04-19 ENCOUNTER — Ambulatory Visit (INDEPENDENT_AMBULATORY_CARE_PROVIDER_SITE_OTHER): Payer: Self-pay | Admitting: Podiatry

## 2019-04-19 ENCOUNTER — Other Ambulatory Visit: Payer: Self-pay

## 2019-04-19 ENCOUNTER — Encounter: Payer: Self-pay | Admitting: Podiatry

## 2019-04-19 VITALS — Temp 97.7°F

## 2019-04-19 DIAGNOSIS — G629 Polyneuropathy, unspecified: Secondary | ICD-10-CM

## 2019-04-19 DIAGNOSIS — B351 Tinea unguium: Secondary | ICD-10-CM

## 2019-04-19 DIAGNOSIS — L608 Other nail disorders: Secondary | ICD-10-CM

## 2019-04-19 DIAGNOSIS — M79676 Pain in unspecified toe(s): Secondary | ICD-10-CM

## 2019-04-19 DIAGNOSIS — E0843 Diabetes mellitus due to underlying condition with diabetic autonomic (poly)neuropathy: Secondary | ICD-10-CM

## 2019-04-19 NOTE — Progress Notes (Signed)
Complaint:  Visit Type: Patient returns to my office for continued preventative foot care services. Complaint: Patient states" my nails have grown long and thick and become painful to walk and wear shoes" Patient has been diagnosed with DM with neuropathy.. The patient presents for preventative foot care services. No changes to ROS  Podiatric Exam: Vascular: dorsalis pedis and posterior tibial pulses are weakly  palpable bilateral. Capillary return is immediate. Temperature gradient is WNL. Skin turgor WNL  Sensorium: Diminished  Semmes Weinstein monofilament test. Normal tactile sensation bilaterally. Nail Exam: Pt has thick disfigured discolored nails with subungual debris noted bilateral entire nail hallux through fifth toenails.  Pincer nails  B/l. Ulcer Exam: There is no evidence of ulcer or pre-ulcerative changes or infection. Orthopedic Exam: Muscle tone and strength are WNL. No limitations in general ROM. No crepitus or effusions noted. Foot type and digits show no abnormalities. 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 Skin: No Porokeratosis. No infection or ulcers  Diagnosis:  Onychomycosis, , Pain in right toe, pain in left toes  Treatment & Plan Procedures and Treatment: Consent by patient was obtained for treatment procedures.   Debridement of mycotic and hypertrophic toenails, 1 through 5 bilateral and clearing of subungual debris. No ulceration, no infection noted.  Return Visit-Office Procedure: Patient instructed to return to the office for a follow up visit 3 months for continued evaluation and treatment.    Tyland Klemens DPM 

## 2019-07-19 ENCOUNTER — Ambulatory Visit: Payer: Medicare Other | Admitting: Podiatry

## 2019-07-26 ENCOUNTER — Other Ambulatory Visit: Payer: Self-pay

## 2019-07-26 ENCOUNTER — Encounter: Payer: Self-pay | Admitting: Podiatry

## 2019-07-26 ENCOUNTER — Ambulatory Visit (INDEPENDENT_AMBULATORY_CARE_PROVIDER_SITE_OTHER): Payer: Medicare Other | Admitting: Podiatry

## 2019-07-26 DIAGNOSIS — M79676 Pain in unspecified toe(s): Secondary | ICD-10-CM | POA: Diagnosis not present

## 2019-07-26 DIAGNOSIS — L608 Other nail disorders: Secondary | ICD-10-CM

## 2019-07-26 DIAGNOSIS — E0843 Diabetes mellitus due to underlying condition with diabetic autonomic (poly)neuropathy: Secondary | ICD-10-CM

## 2019-07-26 DIAGNOSIS — B351 Tinea unguium: Secondary | ICD-10-CM | POA: Diagnosis not present

## 2019-07-26 NOTE — Progress Notes (Signed)
Complaint:  Visit Type: Patient returns to my office for continued preventative foot care services. Complaint: Patient states" my nails have grown long and thick and become painful to walk and wear shoes" Patient has been diagnosed with DM with neuropathy.. The patient presents for preventative foot care services. No changes to ROS  Podiatric Exam: Vascular: dorsalis pedis and posterior tibial pulses are weakly  palpable bilateral. Capillary return is immediate. Temperature gradient is WNL. Skin turgor WNL  Sensorium: Diminished  Semmes Weinstein monofilament test. Normal tactile sensation bilaterally. Nail Exam: Pt has thick disfigured discolored nails with subungual debris noted bilateral entire nail hallux through fifth toenails.  Pincer nails  B/l. Ulcer Exam: There is no evidence of ulcer or pre-ulcerative changes or infection. Orthopedic Exam: Muscle tone and strength are WNL. No limitations in general ROM. No crepitus or effusions noted. Foot type and digits show no abnormalities. 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 Skin: No Porokeratosis. No infection or ulcers  Diagnosis:  Onychomycosis, , Pain in right toe, pain in left toes  Treatment & Plan Procedures and Treatment: Consent by patient was obtained for treatment procedures.   Debridement of mycotic and hypertrophic toenails, 1 through 5 bilateral and clearing of subungual debris. No ulceration, no infection noted.  Return Visit-Office Procedure: Patient instructed to return to the office for a follow up visit 3 months for continued evaluation and treatment.    Savon Cobbs DPM 

## 2019-08-11 ENCOUNTER — Emergency Department
Admission: EM | Admit: 2019-08-11 | Discharge: 2019-08-11 | Disposition: A | Discharge status: Home / Self Care | Payer: Medicare Other | Attending: Emergency Medicine | Admitting: Emergency Medicine

## 2019-08-11 ENCOUNTER — Emergency Department: Payer: Medicare Other

## 2019-08-11 ENCOUNTER — Other Ambulatory Visit: Payer: Self-pay

## 2019-08-11 DIAGNOSIS — R6884 Jaw pain: Secondary | ICD-10-CM | POA: Diagnosis present

## 2019-08-11 DIAGNOSIS — Z7984 Long term (current) use of oral hypoglycemic drugs: Secondary | ICD-10-CM | POA: Diagnosis not present

## 2019-08-11 DIAGNOSIS — M792 Neuralgia and neuritis, unspecified: Secondary | ICD-10-CM | POA: Insufficient documentation

## 2019-08-11 DIAGNOSIS — Z79899 Other long term (current) drug therapy: Secondary | ICD-10-CM | POA: Diagnosis not present

## 2019-08-11 DIAGNOSIS — Z7982 Long term (current) use of aspirin: Secondary | ICD-10-CM | POA: Diagnosis not present

## 2019-08-11 DIAGNOSIS — E119 Type 2 diabetes mellitus without complications: Secondary | ICD-10-CM | POA: Insufficient documentation

## 2019-08-11 DIAGNOSIS — I10 Essential (primary) hypertension: Secondary | ICD-10-CM | POA: Insufficient documentation

## 2019-08-11 DIAGNOSIS — G518 Other disorders of facial nerve: Secondary | ICD-10-CM

## 2019-08-11 LAB — BASIC METABOLIC PANEL
Anion gap: 10 (ref 5–15)
BUN: 18 mg/dL (ref 8–23)
CO2: 25 mmol/L (ref 22–32)
Calcium: 9.3 mg/dL (ref 8.9–10.3)
Chloride: 105 mmol/L (ref 98–111)
Creatinine, Ser: 1 mg/dL (ref 0.44–1.00)
GFR calc Af Amer: 60 mL/min — ABNORMAL LOW (ref >60)
GFR calc non Af Amer: 52 mL/min — ABNORMAL LOW (ref >60)
Glucose, Bld: 147 mg/dL — ABNORMAL HIGH (ref 70–99)
Potassium: 4.3 mmol/L (ref 3.5–5.1)
Sodium: 140 mmol/L (ref 135–145)

## 2019-08-11 LAB — CBC
HCT: 37.2 % (ref 36.0–46.0)
Hemoglobin: 11.8 g/dL — ABNORMAL LOW (ref 12.0–15.0)
MCH: 26.5 pg (ref 26.0–34.0)
MCHC: 31.7 g/dL (ref 30.0–36.0)
MCV: 83.4 fL (ref 80.0–100.0)
Platelets: 193 10*3/uL (ref 150–400)
RBC: 4.46 MIL/uL (ref 3.87–5.11)
RDW: 13.6 % (ref 11.5–15.5)
WBC: 5.4 10*3/uL (ref 4.0–10.5)
nRBC: 0 % (ref 0.0–0.2)

## 2019-08-11 LAB — TROPONIN I (HIGH SENSITIVITY): Troponin I (High Sensitivity): 6 ng/L (ref <18)

## 2019-08-11 MED ORDER — GABAPENTIN 100 MG PO CAPS: 100.0000 mg | ORAL_CAPSULE | Freq: Three times a day (TID) | ORAL | 0 refills | Status: DC | PRN

## 2019-08-11 MED ORDER — ACETAMINOPHEN 325 MG PO TABS: 650.0000 mg | ORAL_TABLET | Freq: Once | ORAL | Status: DC

## 2019-08-11 MED ORDER — GABAPENTIN 100 MG PO CAPS
100.0000 mg | ORAL_CAPSULE | ORAL | Status: DC
  Filled 2019-08-11 (×2): qty 1

## 2019-08-11 NOTE — ED Provider Notes (Signed)
[facility] Emergency Department Provider Note   ____________________________________________   First MD Initiated Contact with Patient 08/11/19 [phone]     (approximate)  I have reviewed the triage vital signs and the nursing notes.   HISTORY  Chief Complaint Jaw Pain    HPI Connie Frederick is a 85 y.o. female after evaluation of left jaw pain  Patient reports that since about 2 AM she has had episodes about 1 to 2 seconds of a severe sharp pain that will come from just across the left lower jaw but not into the eye now to the forehead but just in the jaw region.  It lasts for just a second or 2 and then goes away comes back in about 10 minutes.  Nothing makes it better or worse.  It goes away on its own very quickly  She reports when this pain is present is very severe.  No rash.  No visual changes.  No headaches.  No chest pain or shortness of breath or other symptoms.  She is not had this before   Past Medical History:  Diagnosis Date  . Diabetes mellitus without complication (HCC)   . Hypertension   . Myocardial infarction (HCC)   . Vertigo    HOSPITALIZED 2 WEEKS AGO    Patient Active Problem List   Diagnosis Date Noted  . Vertigo 05/14/2018  . Varicose veins of leg with pain, bilateral 03/29/2017  . Swelling of limb 03/29/2017  . Hyperlipidemia 02/22/2017  . Diabetes (HCC) 02/22/2017  . Pain and swelling of lower extremity 02/22/2017    Past Surgical History:  Procedure Laterality Date  . ABDOMINAL HYSTERECTOMY    . CATARACT EXTRACTION W/PHACO Left 06/08/2018   Procedure: CATARACT EXTRACTION PHACO AND INTRAOCULAR LENS PLACEMENT (IOC);  Surgeon: Lockie Mola, MD;  Location: [facility] ORS;  Service: Ophthalmology;  Laterality: Left;  [place] 04:54 AP% 14.4 CDE 13.2 Fluid pack lot # [id number]  . CATARACT EXTRACTION W/PHACO Right 06/28/2018   Procedure: CATARACT EXTRACTION PHACO AND INTRAOCULAR LENS PLACEMENT (IOC) RIGHT DIABETIC;  Surgeon:  Lockie Mola, MD;  Location: [facility];  Service: Ophthalmology;  Laterality: Right;  diabetic - oral meds  . VEIN LIGATION      Prior to Admission medications   Medication Sig Start Date End Date Taking? Authorizing Provider  acetaminophen (TYLENOL) 650 MG CR tablet Take 650 mg by mouth every 8 (eight) hours as needed for pain.    [provider]  aspirin EC 81 MG tablet Take 81 mg by mouth daily.    [provider]  Cholecalciferol (D3-1000) 1000 units tablet Take 1,000 Units by mouth daily.    [provider]  gabapentin (NEURONTIN) 100 MG capsule Take 1 capsule (100 mg total) by mouth 3 (three) times daily as needed. 08/11/19 08/10/20  Sharyn Creamer, MD  glipiZIDE (GLUCOTROL XL) 2.5 MG 24 hr tablet Take 2.5 mg by mouth daily. 08/25/15   [provider]  losartan (COZAAR) 100 MG tablet Take 100 mg by mouth daily. 08/11/15   [provider]  meclizine (ANTIVERT) 25 MG tablet Take 1 tablet (25 mg total) by mouth 3 (three) times daily as needed for dizziness. 03/27/19   Willy Eddy, MD  meloxicam (MOBIC) 7.5 MG tablet meloxicam 7.5 mg tablet  Take 1 tablet every day by oral route.    [provider]  ondansetron (ZOFRAN ODT) 4 MG disintegrating tablet Take 1 tablet (4 mg total) by mouth every 8 (eight) hours as  needed for nausea or vomiting. 08/12/18   Arnaldo NatalMalinda, Paul F, MD  polyethylene glycol powder (GLYCOLAX/MIRALAX) powder Take 17 g by mouth daily as needed for mild constipation.  10/14/15   [provider]  promethazine (PHENERGAN) 12.5 MG tablet Take 1 tablet (12.5 mg total) by mouth every 8 (eight) hours as needed for nausea or vomiting. 05/15/18   Enid BaasKalisetti, Radhika, MD  rosuvastatin (CRESTOR) 5 MG tablet Take 5 mg by mouth daily.    [provider]    Allergies Patient has no known allergies.  Family History  Problem Relation Age of Onset  . Hypertension Brother     Social History Social  History   Tobacco Use  . Smoking status: Never Smoker  . Smokeless tobacco: Never Used  Substance Use Topics  . Alcohol use: No  . Drug use: No    Review of Systems Constitutional: No fever/chills Eyes: No visual changes. ENT: No sore throat.  See HPI Cardiovascular: Denies chest pain. Respiratory: Denies shortness of breath. Musculoskeletal: Negative for back pain. Skin: Negative for rash. Neurological: Negative for headaches, areas of focal weakness or numbness.    ____________________________________________   PHYSICAL EXAM:  VITAL SIGNS: ED Triage Vitals  Enc Vitals Group     BP 08/11/19 0654 (!) 178/87     Pulse Rate 08/11/19 0654 70     Resp 08/11/19 0654 18     Temp 08/11/19 0654 98.5 F (36.9 C)     Temp Source 08/11/19 0654 Oral     SpO2 08/11/19 0654 96 %     Weight 08/11/19 0652 177 lb (80.3 kg)     Height 08/11/19 0652 5' (1.524 m)     Head Circumference --      Peak Flow --      Pain Score 08/11/19 0652 0     Pain Loc --      Pain Edu? --      Excl. in GC? --     Constitutional: Alert and oriented. Well appearing and in no acute distress. Eyes: Conjunctivae are normal. Head: Atraumatic.  Normal extraocular movements. Nose: No congestion/rhinnorhea.  No conjunctival injection. Mouth/Throat: Mucous membranes are moist.  Edentulous.  No intraoral lesions.  No trismus.  Opens and close the mouth with any pain or difficulty.  I did witness her to have one episode of pain, she winces for about 2 to 3 seconds holds her left jaw and then it goes away about as fast as it came.  Does appear quite painful during the episode though she is completely asymptomatic within seconds. Neck: No stridor.  No rash over the face or jaw.  There is no swelling over the jaw.  There is no intraoral lesions.  Airway widely patent. Cardiovascular: Normal rate, regular rhythm. Grossly normal heart sounds.  Good peripheral circulation. Respiratory: Normal respiratory effort.  No  retractions. Lungs CTAB. Neurologic:  Normal speech and language. No gross focal neurologic deficits are appreciated.  Skin:  Skin is warm, dry and intact. No rash noted. Psychiatric: Mood and affect are normal. Speech and behavior are normal.  ____________________________________________   LABS (all labs ordered are listed, but only abnormal results are displayed)  Labs Reviewed  BASIC METABOLIC PANEL - Abnormal; Notable for the following components:      Result Value   Glucose, Bld 147 (*)    GFR calc non Af Amer 52 (*)    GFR calc Af Amer 60 (*)    All other components within normal  limits  CBC - Abnormal; Notable for the following components:   Hemoglobin 11.8 (*)    All other components within normal limits  TROPONIN I (HIGH SENSITIVITY)  TROPONIN I (HIGH SENSITIVITY)   ____________________________________________  EKG  Reviewed enterotomy at 7 AM Heart rate 70 QRS 80 QTc 430 Normal sinus rhythm, possible LVH.  No evidence of acute ischemia. ____________________________________________  RADIOLOGY  Dg Chest 2 View  Result Date: 08/11/2019 CLINICAL DATA:  Intermittent left jaw pain. EXAM: CHEST - 2 VIEW COMPARISON:  03/15/2018 FINDINGS: The lungs are clear without focal pneumonia, edema, pneumothorax or pleural effusion. Interstitial markings are diffusely coarsened with chronic features. Cardiopericardial silhouette is at upper limits of normal for size. The visualized bony structures of the thorax are intact. IMPRESSION: Stable. No active cardiopulmonary disease. Electronically Signed   By: Misty Stanley M.D.   On: 08/11/2019 08:44    X-ray reviewed negative for acute ____________________________________________   PROCEDURES  Procedure(s) performed: None  Procedures  Critical Care performed: No  ____________________________________________   INITIAL IMPRESSION / ASSESSMENT AND PLAN / ED COURSE  Pertinent labs & imaging results that were available during  my care of the patient were reviewed by me and considered in my medical decision making (see chart for details).   Patient presents for evaluation of intermittent sharp lancinating left lower jaw pain in V3 distribution  Patient denies any associated cardiac neurologic infectious or pulmonary symptoms.  Reassuring examination.  She describes what is likely some type of neuralgia.  Discussed with neurology, recommend they start her on low-dose gabapentin, she has been on this in the past according to record as well.  Also discussed with the patient this could be early symptoms of shingles should her rash appear that she needs to contact her physician or return to the ER  She is awake alert no acute distress.  Appears appropriate for ongoing outpatient management.  No evidence of intraoral or dental cause.  No associated neck pain or swelling.  ----------------------------------------- 10:42 AM on 08/11/2019 -----------------------------------------  Case discussed with Dr. Doy Mince, she recommends patient be treated with gabapentin.  Discussed with the patient and she was successfully treated with that for leg cramps back in July.  She reports that she would be happy to resume this medication, request to go to [place] even though I think they are closed over the weekend we discussed that with patient but she would like to go there.  Also discussed careful follow-up and need to contact physician if she is to notice a rash or worsening symptoms as it could be early symptoms of shingles though no obvious evidence of that today.  Kerrigan JAZLYN TIPPENS was evaluated in Emergency Department on 08/11/2019 for the symptoms described in the history of present illness. She was evaluated in the context of the global COVID-19 pandemic, which necessitated consideration that the patient might be at risk for infection with the SARS-CoV-2 virus that causes COVID-19. Institutional protocols and algorithms that  pertain to the evaluation of patients at risk for COVID-19 are in a state of rapid change based on information released by regulatory bodies including the CDC and federal and state organizations. These policies and algorithms were followed during the patient's care in the ED.  Return precautions and treatment recommendations and follow-up discussed with the patient who is agreeable with the plan.       ____________________________________________   FINAL CLINICAL IMPRESSION(S) / ED DIAGNOSES  Final diagnoses:  Facial neuralgia  Note:  This document was prepared using Conservation officer, historic buildings and may include unintentional dictation errors       Sharyn Creamer, MD 08/11/19 1045

## 2019-08-11 NOTE — ED Notes (Signed)
Patient to waiting room via EMS from home.  Reports having pain in left jaw.  CBG 128, HR 85,  BP106/80, reports EKG was nsr.

## 2019-08-11 NOTE — ED Notes (Signed)
Pt does not want to wait on medication from pharmacy. Pt can take dose when she gets it from her own pharmacy. Pt verbalizes understanding of this.

## 2019-08-11 NOTE — ED Notes (Addendum)
MD Quale at bedside. 

## 2019-08-11 NOTE — ED Triage Notes (Signed)
Patient reports being woken from sleep with left jaw pain.  Reports pain comes and goes and is just in jaw area.

## 2019-10-25 ENCOUNTER — Ambulatory Visit: Payer: Medicare Other | Admitting: Podiatry

## 2019-11-01 ENCOUNTER — Encounter: Payer: Self-pay | Admitting: Podiatry

## 2019-11-01 ENCOUNTER — Ambulatory Visit (INDEPENDENT_AMBULATORY_CARE_PROVIDER_SITE_OTHER): Payer: Medicare Other | Admitting: Podiatry

## 2019-11-01 ENCOUNTER — Other Ambulatory Visit: Payer: Self-pay

## 2019-11-01 DIAGNOSIS — B351 Tinea unguium: Secondary | ICD-10-CM | POA: Diagnosis not present

## 2019-11-01 DIAGNOSIS — E0843 Diabetes mellitus due to underlying condition with diabetic autonomic (poly)neuropathy: Secondary | ICD-10-CM

## 2019-11-01 DIAGNOSIS — L608 Other nail disorders: Secondary | ICD-10-CM

## 2019-11-01 DIAGNOSIS — M79676 Pain in unspecified toe(s): Secondary | ICD-10-CM

## 2019-11-01 NOTE — Progress Notes (Signed)
Complaint:  Visit Type: Patient returns to my office for continued preventative foot care services. Complaint: Patient states" my nails have grown long and thick and become painful to walk and wear shoes" Patient has been diagnosed with DM with neuropathy.. The patient presents for preventative foot care services. No changes to ROS  Podiatric Exam: Vascular: dorsalis pedis and posterior tibial pulses are weakly  palpable bilateral. Capillary return is immediate. Temperature gradient is WNL. Skin turgor WNL  Sensorium: Diminished  Semmes Weinstein monofilament test. Normal tactile sensation bilaterally. Nail Exam: Pt has thick disfigured discolored nails with subungual debris noted bilateral entire nail hallux through fifth toenails.  Pincer nails  B/l. Ulcer Exam: There is no evidence of ulcer or pre-ulcerative changes or infection. Orthopedic Exam: Muscle tone and strength are WNL. No limitations in general ROM. No crepitus or effusions noted. Foot type and digits show no abnormalities. 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 Skin: No Porokeratosis. No infection or ulcers  Diagnosis:  Onychomycosis, , Pain in right toe, pain in left toes  Treatment & Plan Procedures and Treatment: Consent by patient was obtained for treatment procedures.   Debridement of mycotic and hypertrophic toenails, 1 through 5 bilateral and clearing of subungual debris. No ulceration, no infection noted.  Return Visit-Office Procedure: Patient instructed to return to the office for a follow up visit 3 months for continued evaluation and treatment.    Helane Gunther DPM

## 2020-02-01 ENCOUNTER — Other Ambulatory Visit: Payer: Self-pay | Admitting: Family Medicine

## 2020-02-01 DIAGNOSIS — R1032 Left lower quadrant pain: Secondary | ICD-10-CM

## 2020-02-04 ENCOUNTER — Other Ambulatory Visit: Payer: Self-pay

## 2020-02-04 ENCOUNTER — Ambulatory Visit (INDEPENDENT_AMBULATORY_CARE_PROVIDER_SITE_OTHER): Payer: Medicare Other | Admitting: Podiatry

## 2020-02-04 ENCOUNTER — Encounter: Payer: Self-pay | Admitting: Podiatry

## 2020-02-04 VITALS — Temp 97.0°F

## 2020-02-04 DIAGNOSIS — B351 Tinea unguium: Secondary | ICD-10-CM

## 2020-02-04 DIAGNOSIS — G629 Polyneuropathy, unspecified: Secondary | ICD-10-CM | POA: Diagnosis not present

## 2020-02-04 DIAGNOSIS — M79676 Pain in unspecified toe(s): Secondary | ICD-10-CM

## 2020-02-04 DIAGNOSIS — L608 Other nail disorders: Secondary | ICD-10-CM | POA: Diagnosis not present

## 2020-02-04 DIAGNOSIS — E0843 Diabetes mellitus due to underlying condition with diabetic autonomic (poly)neuropathy: Secondary | ICD-10-CM

## 2020-02-04 NOTE — Progress Notes (Signed)
This patient returns to my office for at risk foot care.  This patient requires this care by a professional since this patient will be at risk due to having diabetes and neuropathy. This patient is unable to cut nails himself since the patient cannot reach his nails.These nails are painful walking and wearing shoes.  This patient presents for at risk foot care today.  General Appearance  Alert, conversant and in no acute stress.  Vascular  Dorsalis pedis and posterior tibial  pulses are weakly  palpable  bilaterally.  Capillary return is within normal limits  bilaterally. Temperature is within normal limits  bilaterally.  Neurologic  Senn-Weinstein monofilament wire test within normal limits  bilaterally. Muscle power within normal limits bilaterally.  Nails Thick disfigured discolored nails with subungual debris  from hallux to fifth toes bilaterally.Pincer nails hallux  B/L. No evidence of bacterial infection or drainage bilaterally.  Orthopedic  No limitations of motion  feet .  No crepitus or effusions noted.  No bony pathology or digital deformities noted. HAV with hammer toes 2  B/L.  Skin  normotropic skin with no porokeratosis noted bilaterally.  No signs of infections or ulcers noted.     Onychomycosis  Pain in right toes  Pain in left toes  Consent was obtained for treatment procedures.   Mechanical debridement of nails 1-5  bilaterally performed with a nail nipper.  Filed with dremel without incident.    Return office visit 10 weeks                    Told patient to return for periodic foot care and evaluation due to potential at risk complications.   Helane Gunther DPM

## 2020-02-06 ENCOUNTER — Other Ambulatory Visit: Payer: Self-pay

## 2020-02-06 ENCOUNTER — Ambulatory Visit
Admission: RE | Admit: 2020-02-06 | Discharge: 2020-02-06 | Disposition: A | Discharge status: Home / Self Care | Payer: Medicare Other | Source: Ambulatory Visit | Attending: Family Medicine | Admitting: Family Medicine

## 2020-02-06 DIAGNOSIS — R1032 Left lower quadrant pain: Secondary | ICD-10-CM | POA: Diagnosis present

## 2020-02-09 ENCOUNTER — Other Ambulatory Visit: Payer: Self-pay

## 2020-02-09 ENCOUNTER — Emergency Department: Payer: Medicare Other

## 2020-02-09 ENCOUNTER — Emergency Department
Admission: EM | Admit: 2020-02-09 | Discharge: 2020-02-09 | Disposition: A | Discharge status: Home / Self Care | Payer: Medicare Other | Attending: Emergency Medicine | Admitting: Emergency Medicine

## 2020-02-09 ENCOUNTER — Encounter: Payer: Self-pay | Admitting: Emergency Medicine

## 2020-02-09 DIAGNOSIS — Z7982 Long term (current) use of aspirin: Secondary | ICD-10-CM | POA: Diagnosis not present

## 2020-02-09 DIAGNOSIS — R252 Cramp and spasm: Secondary | ICD-10-CM | POA: Diagnosis not present

## 2020-02-09 DIAGNOSIS — Z7984 Long term (current) use of oral hypoglycemic drugs: Secondary | ICD-10-CM | POA: Diagnosis not present

## 2020-02-09 DIAGNOSIS — I10 Essential (primary) hypertension: Secondary | ICD-10-CM | POA: Insufficient documentation

## 2020-02-09 DIAGNOSIS — I252 Old myocardial infarction: Secondary | ICD-10-CM | POA: Insufficient documentation

## 2020-02-09 DIAGNOSIS — M79652 Pain in left thigh: Secondary | ICD-10-CM | POA: Insufficient documentation

## 2020-02-09 DIAGNOSIS — E119 Type 2 diabetes mellitus without complications: Secondary | ICD-10-CM | POA: Insufficient documentation

## 2020-02-09 DIAGNOSIS — R42 Dizziness and giddiness: Secondary | ICD-10-CM | POA: Diagnosis not present

## 2020-02-09 DIAGNOSIS — Z79899 Other long term (current) drug therapy: Secondary | ICD-10-CM | POA: Insufficient documentation

## 2020-02-09 DIAGNOSIS — R1032 Left lower quadrant pain: Secondary | ICD-10-CM | POA: Insufficient documentation

## 2020-02-09 DIAGNOSIS — R202 Paresthesia of skin: Secondary | ICD-10-CM | POA: Diagnosis not present

## 2020-02-09 LAB — URINALYSIS, COMPLETE (UACMP) WITH MICROSCOPIC
Bacteria, UA: NONE SEEN
Bilirubin Urine: NEGATIVE
Glucose, UA: NEGATIVE mg/dL
Hgb urine dipstick: NEGATIVE
Ketones, ur: NEGATIVE mg/dL
Nitrite: NEGATIVE
Protein, ur: NEGATIVE mg/dL
Specific Gravity, Urine: 1.012 (ref 1.005–1.030)
pH: 5 (ref 5.0–8.0)

## 2020-02-09 LAB — COMPREHENSIVE METABOLIC PANEL
ALT: 15 U/L (ref 0–44)
AST: 27 U/L (ref 15–41)
Albumin: 4 g/dL (ref 3.5–5.0)
Alkaline Phosphatase: 49 U/L (ref 38–126)
Anion gap: 8 (ref 5–15)
BUN: 16 mg/dL (ref 8–23)
CO2: 26 mmol/L (ref 22–32)
Calcium: 9 mg/dL (ref 8.9–10.3)
Chloride: 106 mmol/L (ref 98–111)
Creatinine, Ser: 0.94 mg/dL (ref 0.44–1.00)
GFR calc Af Amer: >60 mL/min (ref >60)
GFR calc non Af Amer: 56 mL/min — ABNORMAL LOW (ref >60)
Glucose, Bld: 101 mg/dL — ABNORMAL HIGH (ref 70–99)
Potassium: 4.1 mmol/L (ref 3.5–5.1)
Sodium: 140 mmol/L (ref 135–145)
Total Bilirubin: 0.9 mg/dL (ref 0.3–1.2)
Total Protein: 6.6 g/dL (ref 6.5–8.1)

## 2020-02-09 LAB — CBC WITH DIFFERENTIAL/PLATELET
Abs Immature Granulocytes: 0 10*3/uL (ref 0.00–0.07)
Basophils Absolute: 0 10*3/uL (ref 0.0–0.1)
Basophils Relative: 1 %
Eosinophils Absolute: 0.1 10*3/uL (ref 0.0–0.5)
Eosinophils Relative: 2 %
HCT: 36.1 % (ref 36.0–46.0)
Hemoglobin: 11.8 g/dL — ABNORMAL LOW (ref 12.0–15.0)
Immature Granulocytes: 0 %
Lymphocytes Relative: 49 %
Lymphs Abs: 2 10*3/uL (ref 0.7–4.0)
MCH: 27.5 pg (ref 26.0–34.0)
MCHC: 32.7 g/dL (ref 30.0–36.0)
MCV: 84.1 fL (ref 80.0–100.0)
Monocytes Absolute: 0.3 10*3/uL (ref 0.1–1.0)
Monocytes Relative: 7 %
Neutro Abs: 1.7 10*3/uL (ref 1.7–7.7)
Neutrophils Relative %: 41 %
Platelets: 188 10*3/uL (ref 150–400)
RBC: 4.29 MIL/uL (ref 3.87–5.11)
RDW: 13.5 % (ref 11.5–15.5)
WBC: 4.1 10*3/uL (ref 4.0–10.5)
nRBC: 0 % (ref 0.0–0.2)

## 2020-02-09 MED ORDER — SODIUM CHLORIDE 0.9 % IV BOLUS
500.0000 mL | Freq: Once | INTRAVENOUS | Status: AC
  Administered 2020-02-09: 500 mL via INTRAVENOUS

## 2020-02-09 MED ORDER — IOHEXOL 300 MG/ML  SOLN
100.0000 mL | Freq: Once | INTRAMUSCULAR | Status: AC | PRN
  Administered 2020-02-09: 100 mL via INTRAVENOUS

## 2020-02-09 NOTE — ED Notes (Addendum)
Pt verbalized understanding of discharge instructions. NAD at this time. Signature not working. Hardcopy of discharge agreement printed and signed by patient and this RN.

## 2020-02-09 NOTE — ED Triage Notes (Addendum)
First nurse note- arrived EMS for leg cramps and dizziness since 3AM.  VSS with EMS. CBG 112 with EMS. Hx vertigo. Alert and oriented with EMS.  Per EMS pt reported to them she would like her kids to put her in a nursing home.

## 2020-02-09 NOTE — ED Provider Notes (Signed)
[facility] Emergency Department Provider Note  ____________________________________________  Time seen: Approximately 12:04 PM  I have reviewed the triage vital signs and the nursing notes.   HISTORY  Chief Complaint Dizziness and leg cramps    HPI Connie Frederick is a 86 y.o. female with a history of diabetes hypertension and vertigo who comes the ED complaining of  left leg cramping pain in the thigh with paresthesia that started around 3 AM.  Intermittent, waxing and waning, no aggravating alleviating factors, nonradiating.  She also felt dizzy at that time which felt like her usual vertigo and has now resolved.  Denies vision changes, or lateralizing weakness.  Denies vomiting diarrhea constipation or dysuria.  Reports she recently had an ultrasound of the left thigh in the area of pain.  Reviewed electronic medical record which shows that she was found to have an enlarged lymph node in this area without signs of inflammation or abscess.     Past Medical History:  Diagnosis Date  . Diabetes mellitus without complication (HCC)   . Hypertension   . Myocardial infarction (HCC)   . Vertigo    HOSPITALIZED 2 WEEKS AGO     Patient Active Problem List   Diagnosis Date Noted  . Vertigo 05/14/2018  . Varicose veins of leg with pain, bilateral 03/29/2017  . Swelling of limb 03/29/2017  . Hyperlipidemia 02/22/2017  . Diabetes (HCC) 02/22/2017  . Pain and swelling of lower extremity 02/22/2017     Past Surgical History:  Procedure Laterality Date  . ABDOMINAL HYSTERECTOMY    . CATARACT EXTRACTION W/PHACO Left 06/08/2018   Procedure: CATARACT EXTRACTION PHACO AND INTRAOCULAR LENS PLACEMENT (IOC);  Surgeon: Lockie Mola, MD;  Location: [facility] ORS;  Service: Ophthalmology;  Laterality: Left;  [place] 04:54 AP% 14.4 CDE 13.2 Fluid pack lot # [id number]  . CATARACT EXTRACTION W/PHACO Right 06/28/2018   Procedure: CATARACT EXTRACTION PHACO AND INTRAOCULAR  LENS PLACEMENT (IOC) RIGHT DIABETIC;  Surgeon: Lockie Mola, MD;  Location: [facility];  Service: Ophthalmology;  Laterality: Right;  diabetic - oral meds  . VEIN LIGATION       Prior to Admission medications   Medication Sig Start Date End Date Taking? Authorizing Provider  acetaminophen (TYLENOL) 650 MG CR tablet Take 650 mg by mouth every 8 (eight) hours as needed for pain.    [provider]  aspirin EC 81 MG tablet Take 81 mg by mouth daily.    [provider]  Cholecalciferol (D3-1000) 1000 units tablet Take 1,000 Units by mouth daily.    [provider]  Cyanocobalamin (VITAMIN B 12 PO) Take 1,000 mcg by mouth.    [provider]  donepezil (ARICEPT) 5 MG tablet donepezil 5 mg tablet    [provider]  gabapentin (NEURONTIN) 100 MG capsule Take 1 capsule (100 mg total) by mouth 3 (three) times daily as needed. Patient not taking: Reported on 02/04/2020 08/11/19 08/10/20  Sharyn Creamer, MD  glipiZIDE (GLUCOTROL XL) 2.5 MG 24 hr tablet Take 2.5 mg by mouth daily. 08/25/15   [provider]  losartan (COZAAR) 100 MG tablet Take 100 mg by mouth daily. 08/11/15   [provider]  meclizine (ANTIVERT) 25 MG tablet Take 1 tablet (25 mg total) by mouth 3 (three) times daily as needed for dizziness. Patient not taking: Reported on 02/04/2020 03/27/19   Willy Eddy, MD  meloxicam (MOBIC) 7.5 MG tablet meloxicam 7.5 mg tablet  Take 1 tablet every day by oral route.  [provider]  ondansetron (ZOFRAN ODT) 4 MG disintegrating tablet Take 1 tablet (4 mg total) by mouth every 8 (eight) hours as needed for nausea or vomiting. Patient not taking: Reported on 02/04/2020 08/12/18   Arnaldo Natal, MD  polyethylene glycol powder (GLYCOLAX/MIRALAX) powder Take 17 g by mouth daily as needed for mild constipation.  10/14/15   [provider]  promethazine (PHENERGAN) 12.5 MG tablet Take 1 tablet (12.5 mg  total) by mouth every 8 (eight) hours as needed for nausea or vomiting. Patient not taking: Reported on 02/04/2020 05/15/18   Enid Baas, MD  rosuvastatin (CRESTOR) 5 MG tablet Take 5 mg by mouth daily.    [provider]     Allergies Patient has no known allergies.   Family History  Problem Relation Age of Onset  . Hypertension Brother     Social History Social History   Tobacco Use  . Smoking status: Never Smoker  . Smokeless tobacco: Never Used  Substance Use Topics  . Alcohol use: No  . Drug use: No    Review of Systems  Constitutional:   No fever or chills.  ENT:   No sore throat. No rhinorrhea. Cardiovascular:   No chest pain or syncope. Respiratory:   No dyspnea or cough. Gastrointestinal:   Negative for abdominal pain, vomiting and diarrhea.  Musculoskeletal: Positive left groin pain as above All other systems reviewed and are negative except as documented above in ROS and HPI.  ____________________________________________   PHYSICAL EXAM:  VITAL SIGNS: ED Triage Vitals [02/09/20 0920]  Enc Vitals Group     BP (!) 147/71     Pulse Rate 72     Resp 16     Temp 98.1 F (36.7 C)     Temp src      SpO2 98 %     Weight 170 lb (77.1 kg)     Height 5' (1.524 m)     Head Circumference      Peak Flow      Pain Score 0     Pain Loc      Pain Edu?      Excl. in GC?     Vital signs reviewed, nursing assessments reviewed.   Constitutional:   Alert and oriented. Non-toxic appearance. Eyes:   Conjunctivae are normal. EOMI. PERRL. ENT      Head:   Normocephalic and atraumatic.      Nose:   Wearing a mask.      Mouth/Throat:   Wearing a mask.      Neck:   No meningismus. Full ROM. Hematological/Lymphatic/Immunilogical:   No cervical lymphadenopathy. Cardiovascular:   RRR. Symmetric bilateral radial and DP pulses.  No murmurs. Cap refill less than 2 seconds. Respiratory:   Normal respiratory effort without tachypnea/retractions. Breath  sounds are clear and equal bilaterally. No wheezes/rales/rhonchi. Gastrointestinal:   Soft with lower abdominal fullness and mild tenderness.. Non distended. There is no CVA tenderness.  No rebound, rigidity, or guarding.  Musculoskeletal:   Normal range of motion in all extremities. No joint effusions.  Tenderness at the left inguinal ligament and left inner thigh without inflammatory changes.  No edema. Neurologic:   Normal speech and language.  Motor grossly intact. No acute focal neurologic deficits are appreciated.  Skin:    Skin is warm, dry and intact. No rash noted.  No petechiae, purpura, or bullae.  ____________________________________________    LABS (pertinent positives/negatives) (all labs ordered are listed, but only abnormal  results are displayed) Labs Reviewed  COMPREHENSIVE METABOLIC PANEL - Abnormal; Notable for the following components:      Result Value   Glucose, Bld 101 (*)    GFR calc non Af Amer 56 (*)    All other components within normal limits  CBC WITH DIFFERENTIAL/PLATELET - Abnormal; Notable for the following components:   Hemoglobin 11.8 (*)    All other components within normal limits  URINALYSIS, COMPLETE (UACMP) WITH MICROSCOPIC - Abnormal; Notable for the following components:   Color, Urine YELLOW (*)    APPearance HAZY (*)    Leukocytes,Ua SMALL (*)    All other components within normal limits   ____________________________________________   EKG    ____________________________________________    RADIOLOGY  CT ABDOMEN PELVIS W CONTRAST  Result Date: 02/09/2020 CLINICAL DATA:  Left lower quadrant abdominal pain EXAM: CT ABDOMEN AND PELVIS WITH CONTRAST TECHNIQUE: Multidetector CT imaging of the abdomen and pelvis was performed using the standard protocol following bolus administration of intravenous contrast. CONTRAST:  OMNIPAQUE IOHEXOL 300 MG/ML  SOLN COMPARISON:  None. FINDINGS: Lower chest: No acute abnormality. Small hiatal  hernia. Hepatobiliary: No solid liver abnormality is seen. Gallstones in the dependent gallbladder. Mild gallbladder wall thickening. No biliary dilatation. Pancreas: Unremarkable. No pancreatic ductal dilatation or surrounding inflammatory changes. Spleen: Normal in size without significant abnormality. Adrenals/Urinary Tract: Adrenal glands are unremarkable. Kidneys are normal, without renal calculi, solid lesion, or hydronephrosis. Bladder is unremarkable. Stomach/Bowel: Stomach is within normal limits. Appendix appears normal. No evidence of bowel wall thickening, distention, or inflammatory changes. Pancolonic diverticulosis. Vascular/Lymphatic: Aortic atherosclerosis. No enlarged abdominal or pelvic lymph nodes. Reproductive: Status post hysterectomy. Other: No abdominal wall hernia or abnormality. No abdominopelvic ascites. Musculoskeletal: No acute or significant osseous findings. Degenerative anterolisthesis of L4 on L5. IMPRESSION: 1. No findings to specifically explain left lower quadrant abdominal pain. 2. Pancolonic diverticulosis without evidence of acute diverticulitis. 3. Cholelithiasis with mild gallbladder wall thickening. No biliary ductal dilatation. This can be further evaluated by ultrasound if there is clinical concern for acute cholecystitis. 4. Aortic Atherosclerosis (ICD10-I70.0). Electronically Signed   By: Lauralyn Primes M.D.   On: 02/09/2020 12:37   US Venous Img Lower Unilateral Left  Result Date: 02/09/2020 CLINICAL DATA:  Left leg pain and paresthesia EXAM: LEFT LOWER EXTREMITY VENOUS DOPPLER ULTRASOUND TECHNIQUE: Gray-scale sonography with compression, as well as color and duplex ultrasound, were performed to evaluate the deep venous system(s) from the level of the common femoral vein through the popliteal and proximal calf veins. COMPARISON:  None. FINDINGS: VENOUS Normal compressibility of the common femoral, superficial femoral, and popliteal veins, as well as the visualized  calf veins. Visualized portions of profunda femoral vein and great saphenous vein unremarkable. No filling defects to suggest DVT on grayscale or color Doppler imaging. Doppler waveforms show normal direction of venous flow, normal respiratory plasticity and response to augmentation. Limited views of the contralateral common femoral vein are unremarkable. IMPRESSION: Negative. Electronically Signed   By: Marnee Spring M.D.   On: 02/09/2020 11:29    ____________________________________________   PROCEDURES Procedures  ____________________________________________  DIFFERENTIAL DIAGNOSIS   Pelvic mass, diverticulitis, DVT, lymphadenopathy, dehydration  CLINICAL IMPRESSION / ASSESSMENT AND PLAN / ED COURSE  Medications ordered in the ED: Medications  iohexol (OMNIPAQUE) 300 MG/ML solution 100 mL (100 mLs Intravenous Contrast Given 02/09/20 1203)  sodium chloride 0.9 % bolus 500 mL (0 mLs Intravenous Stopped 02/09/20 1411)    Pertinent labs & imaging results that were available  during my care of the patient were reviewed by me and considered in my medical decision making (see chart for details).  Connie Frederick was evaluated in Emergency Department on 02/09/2020 for the symptoms described in the history of present illness. She was evaluated in the context of the global COVID-19 pandemic, which necessitated consideration that the patient might be at risk for infection with the SARS-CoV-2 virus that causes COVID-19. Institutional protocols and algorithms that pertain to the evaluation of patients at risk for COVID-19 are in a state of rapid change based on information released by regulatory bodies including the CDC and federal and state organizations. These policies and algorithms were followed during the patient's care in the ED.   Patient presents with left thigh pain, some lower abdominal tenderness on exam.  Will check labs, urinalysis, ultrasound left leg to rule out DVT.  If no UTI, will  need CT scan to assess for diverticulitis, pelvic mass or other intra-abdominal pathology.  Dizziness has resolved, no other neurologic symptoms or findings, does not need neuroimaging at this time.  Possibly pain or dehydration related.  Clinical Course as of Feb 08 1413  Sat Feb 09, 2020  1204 Labs and left lower extremity venous ultrasound negative.  We will follow-up CT scan.   [PS]  8032 CT a/p unremarkable. Stable for DC home to f/u with PCP for continued monitoring of enlarged lymph node.   [PS]    Clinical Course User Index [PS] Carrie Mew, MD     ____________________________________________   FINAL CLINICAL IMPRESSION(S) / ED DIAGNOSES    Final diagnoses:  Left thigh pain     ED Discharge Orders    None      Portions of this note were generated with dragon dictation software. Dictation errors may occur despite best attempts at proofreading.   Carrie Mew, MD 02/09/20 1414

## 2020-02-09 NOTE — Discharge Instructions (Addendum)
Your lab tests, left leg venous ultrasound, and CT scan of the abdomen were all okay today.  Your tests today are reassuring. Please follow up with your doctor for continued evaluation.

## 2020-02-09 NOTE — ED Triage Notes (Signed)
Pt to ED via [facility] from home for dizziness and leg cramps that started around 0230 or 3 AM this morning. Pt had ultrasound scan on Wednesday on her right leg but she has not gotten the results back yet. Pt states that she is still having some dizziness now. Pt is in NAD.

## 2020-04-21 ENCOUNTER — Ambulatory Visit (INDEPENDENT_AMBULATORY_CARE_PROVIDER_SITE_OTHER): Payer: Medicare Other | Admitting: Podiatry

## 2020-04-21 ENCOUNTER — Ambulatory Visit: Payer: Self-pay

## 2020-04-21 ENCOUNTER — Encounter: Payer: Self-pay | Admitting: Podiatry

## 2020-04-21 ENCOUNTER — Other Ambulatory Visit: Payer: Self-pay

## 2020-04-21 DIAGNOSIS — E0843 Diabetes mellitus due to underlying condition with diabetic autonomic (poly)neuropathy: Secondary | ICD-10-CM | POA: Diagnosis not present

## 2020-04-21 DIAGNOSIS — L608 Other nail disorders: Secondary | ICD-10-CM | POA: Diagnosis not present

## 2020-04-21 DIAGNOSIS — M79676 Pain in unspecified toe(s): Secondary | ICD-10-CM | POA: Diagnosis not present

## 2020-04-21 DIAGNOSIS — B351 Tinea unguium: Secondary | ICD-10-CM | POA: Diagnosis not present

## 2020-04-21 DIAGNOSIS — M722 Plantar fascial fibromatosis: Secondary | ICD-10-CM

## 2020-04-21 NOTE — Progress Notes (Signed)
This patient returns to my office for at risk foot care.  This patient requires this care by a professional since this patient will be at risk due to having diabetes and neuropathy.  This patient is unable to cut nails herself since the patient cannot reach her nails.These nails are painful walking and wearing shoes.  This patient presents for at risk foot care today.  General Appearance  Alert, conversant and in no acute stress.  Vascular  Dorsalis pedis and posterior tibial  pulses are weakly  palpable  bilaterally.  Capillary return is within normal limits  bilaterally. Temperature is within normal limits  bilaterally.  Neurologic  Senn-Weinstein monofilament wire test within normal limits  bilaterally. Muscle power within normal limits bilaterally.  Nails Thick disfigured discolored nails with subungual debris  from hallux to fifth toes bilaterally.Pincer nails hallux  B/L. No evidence of bacterial infection or drainage bilaterally.  Orthopedic  No limitations of motion  feet .  No crepitus or effusions noted.  No bony pathology or digital deformities noted. HAV with hammer toes 2  B/L.  Skin  normotropic skin with no porokeratosis noted bilaterally.  No signs of infections or ulcers noted.     Onychomycosis  Pain in right toes  Pain in left toes  Consent was obtained for treatment procedures.   Mechanical debridement of nails 1-5  bilaterally performed with a nail nipper.  Filed with dremel without incident.    Return office visit 10   weeks                    Told patient to return for periodic foot care and evaluation due to potential at risk complications.   Whitman Meinhardt DPM  

## 2020-06-05 IMAGING — CR DG CHEST 2V
2 series · 2 of 2 positions shown · non-contrast
Comparison: 03/15/2018

CLINICAL DATA: Intermittent left jaw pain.

EXAM:
CHEST - 2 VIEW

[chest lat]
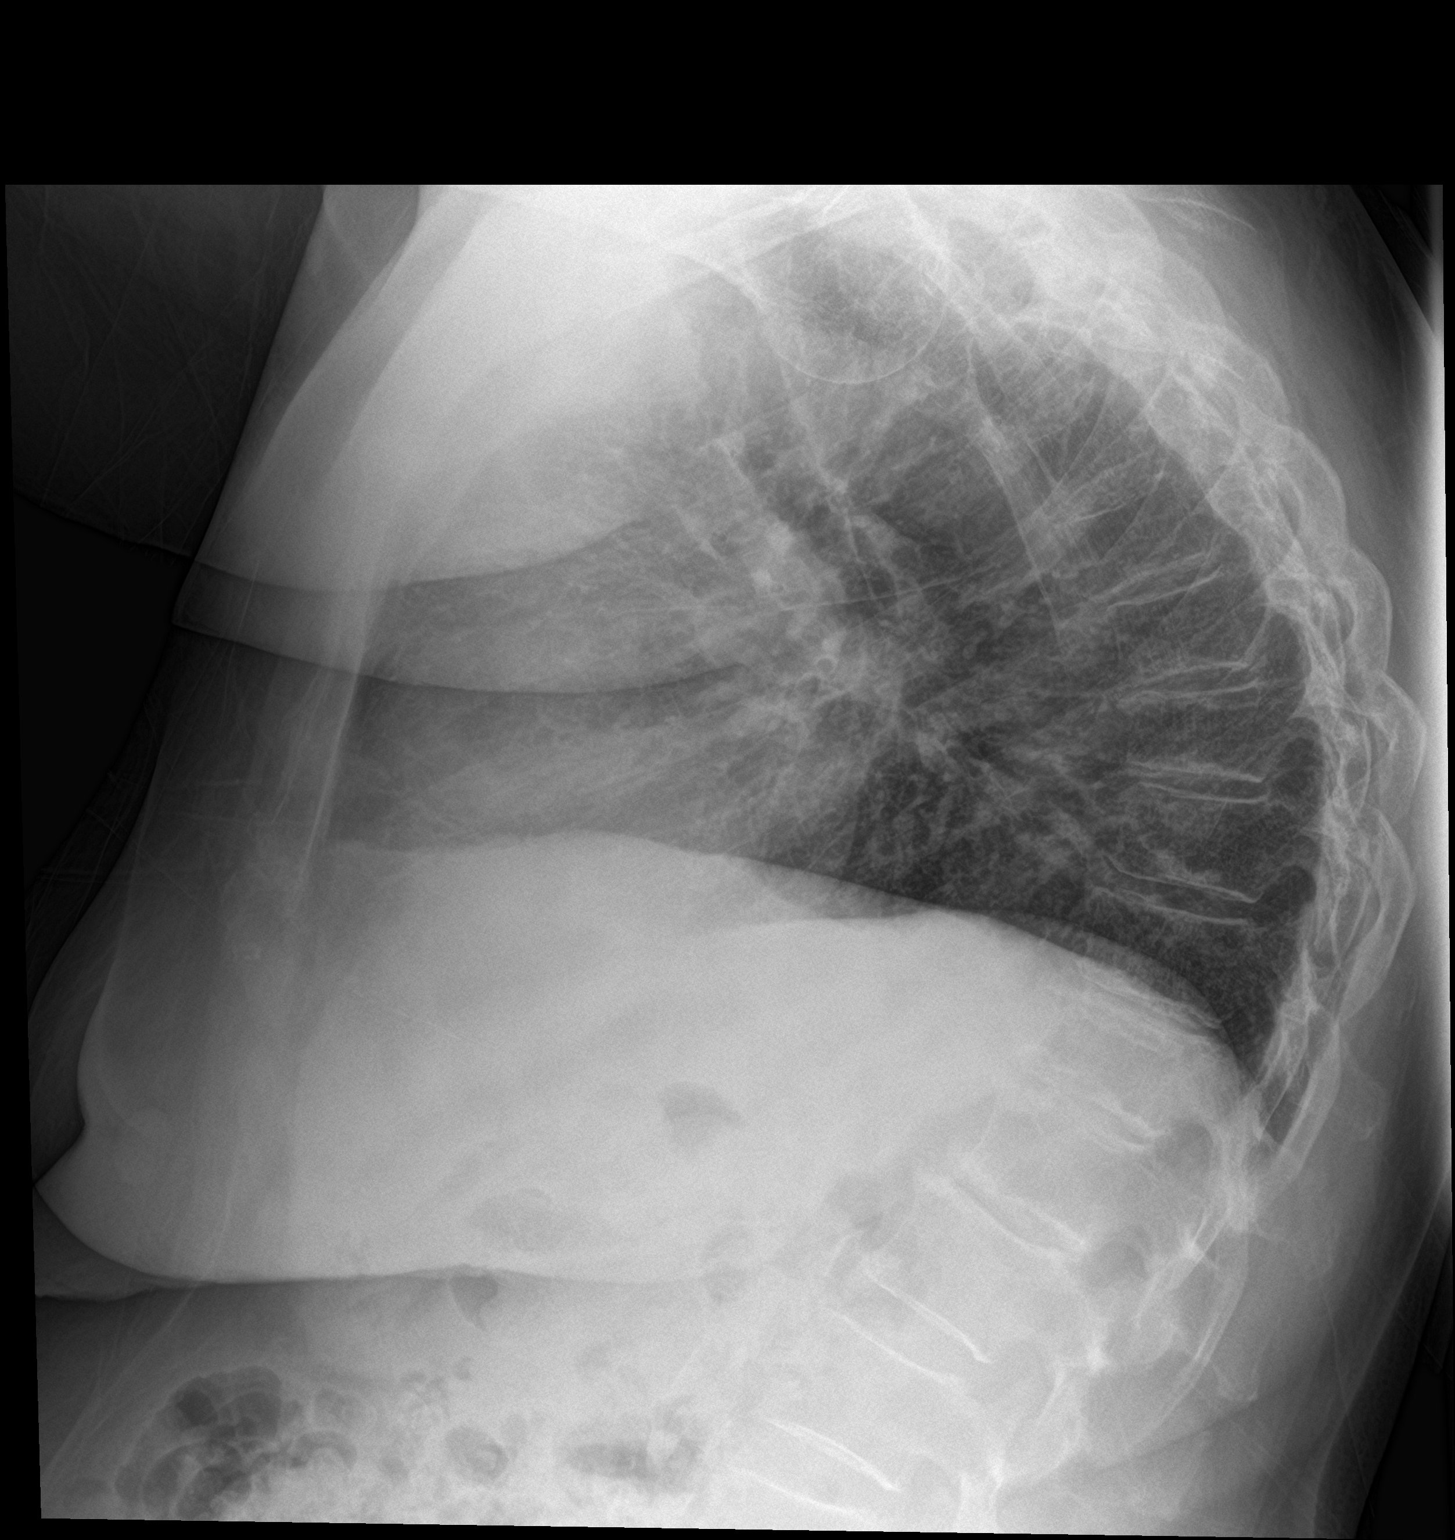

[chest ap]
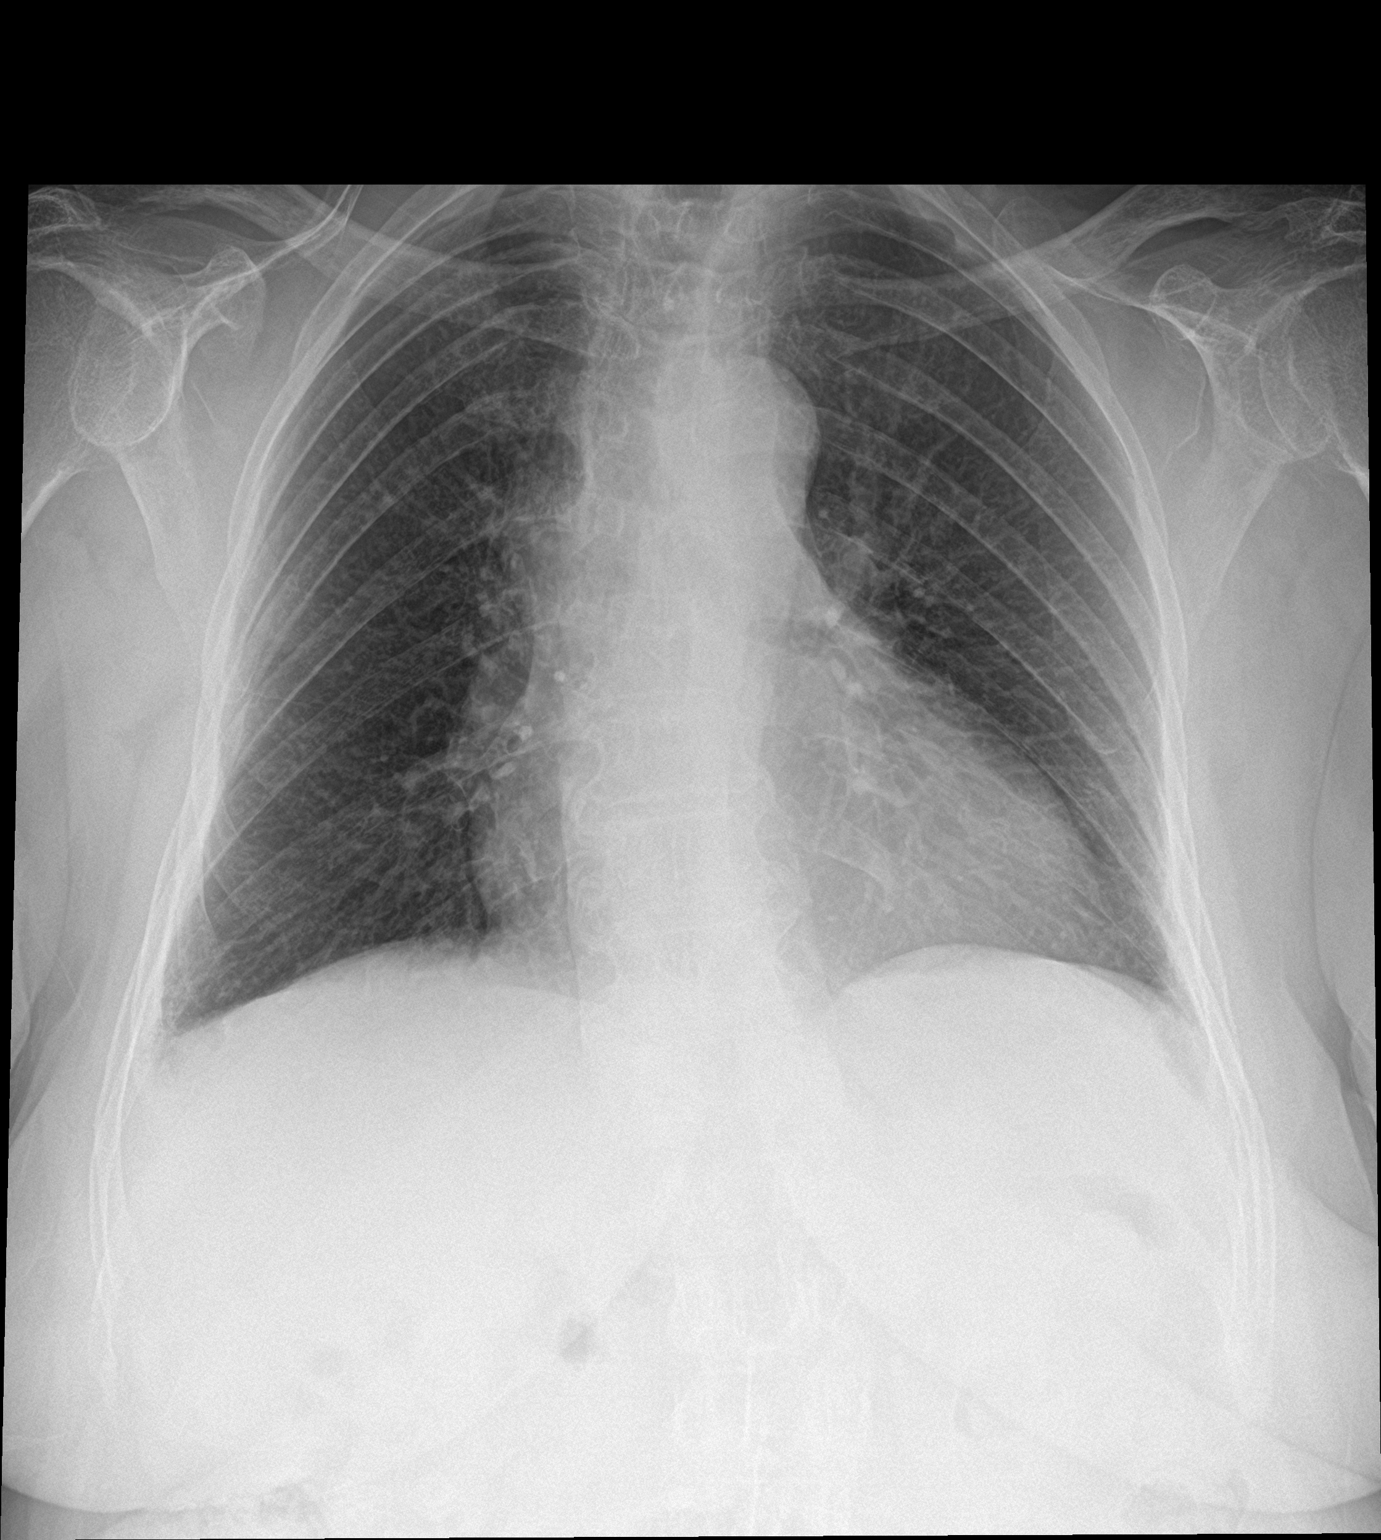

[2 of 2 positions shown; findings below may reference images not displayed]

FINDINGS: The lungs are clear without focal pneumonia, edema, pneumothorax or
pleural effusion. Interstitial markings are diffusely coarsened with
chronic features. Cardiopericardial silhouette is at upper limits of
normal for size. The visualized bony structures of the thorax are
intact.
IMPRESSION: Stable. No active cardiopulmonary disease.

## 2020-06-12 ENCOUNTER — Other Ambulatory Visit: Payer: Self-pay

## 2020-06-12 ENCOUNTER — Emergency Department: Payer: Medicare Other

## 2020-06-12 ENCOUNTER — Emergency Department
Admission: EM | Admit: 2020-06-12 | Discharge: 2020-06-12 | Disposition: A | Discharge status: Home / Self Care | Payer: Medicare Other | Attending: Student in an Organized Health Care Education/Training Program | Admitting: Student in an Organized Health Care Education/Training Program

## 2020-06-12 DIAGNOSIS — R600 Localized edema: Secondary | ICD-10-CM | POA: Diagnosis not present

## 2020-06-12 DIAGNOSIS — I10 Essential (primary) hypertension: Secondary | ICD-10-CM | POA: Diagnosis not present

## 2020-06-12 DIAGNOSIS — R350 Frequency of micturition: Secondary | ICD-10-CM | POA: Insufficient documentation

## 2020-06-12 DIAGNOSIS — R252 Cramp and spasm: Secondary | ICD-10-CM | POA: Diagnosis not present

## 2020-06-12 DIAGNOSIS — Z7982 Long term (current) use of aspirin: Secondary | ICD-10-CM | POA: Diagnosis not present

## 2020-06-12 DIAGNOSIS — E119 Type 2 diabetes mellitus without complications: Secondary | ICD-10-CM | POA: Insufficient documentation

## 2020-06-12 LAB — COMPREHENSIVE METABOLIC PANEL
ALT: 15 U/L (ref 0–44)
AST: 22 U/L (ref 15–41)
Albumin: 3.8 g/dL (ref 3.5–5.0)
Alkaline Phosphatase: 54 U/L (ref 38–126)
Anion gap: 10 (ref 5–15)
BUN: 17 mg/dL (ref 8–23)
CO2: 26 mmol/L (ref 22–32)
Calcium: 9.3 mg/dL (ref 8.9–10.3)
Chloride: 107 mmol/L (ref 98–111)
Creatinine, Ser: 0.9 mg/dL (ref 0.44–1.00)
GFR calc Af Amer: >60 mL/min (ref >60)
GFR calc non Af Amer: 58 mL/min — ABNORMAL LOW (ref >60)
Glucose, Bld: 90 mg/dL (ref 70–99)
Potassium: 3.9 mmol/L (ref 3.5–5.1)
Sodium: 143 mmol/L (ref 135–145)
Total Bilirubin: 0.6 mg/dL (ref 0.3–1.2)
Total Protein: 6.4 g/dL — ABNORMAL LOW (ref 6.5–8.1)

## 2020-06-12 LAB — URINALYSIS, COMPLETE (UACMP) WITH MICROSCOPIC
Bacteria, UA: NONE SEEN
Bilirubin Urine: NEGATIVE
Glucose, UA: NEGATIVE mg/dL
Hgb urine dipstick: NEGATIVE
Ketones, ur: NEGATIVE mg/dL
Leukocytes,Ua: NEGATIVE
Nitrite: NEGATIVE
Protein, ur: NEGATIVE mg/dL
Specific Gravity, Urine: 1.006 (ref 1.005–1.030)
pH: 6 (ref 5.0–8.0)

## 2020-06-12 LAB — CBC WITH DIFFERENTIAL/PLATELET
Abs Immature Granulocytes: 0.02 10*3/uL (ref 0.00–0.07)
Basophils Absolute: 0 10*3/uL (ref 0.0–0.1)
Basophils Relative: 1 %
Eosinophils Absolute: 0.1 10*3/uL (ref 0.0–0.5)
Eosinophils Relative: 1 %
HCT: 36.4 % (ref 36.0–46.0)
Hemoglobin: 11.8 g/dL — ABNORMAL LOW (ref 12.0–15.0)
Immature Granulocytes: 0 %
Lymphocytes Relative: 50 %
Lymphs Abs: 2.7 10*3/uL (ref 0.7–4.0)
MCH: 27.3 pg (ref 26.0–34.0)
MCHC: 32.4 g/dL (ref 30.0–36.0)
MCV: 84.1 fL (ref 80.0–100.0)
Monocytes Absolute: 0.3 10*3/uL (ref 0.1–1.0)
Monocytes Relative: 6 %
Neutro Abs: 2.2 10*3/uL (ref 1.7–7.7)
Neutrophils Relative %: 42 %
Platelets: 207 10*3/uL (ref 150–400)
RBC: 4.33 MIL/uL (ref 3.87–5.11)
RDW: 13.1 % (ref 11.5–15.5)
WBC: 5.3 10*3/uL (ref 4.0–10.5)
nRBC: 0 % (ref 0.0–0.2)

## 2020-06-12 LAB — BRAIN NATRIURETIC PEPTIDE: B Natriuretic Peptide: 134.1 pg/mL — ABNORMAL HIGH (ref 0.0–100.0)

## 2020-06-12 LAB — MAGNESIUM: Magnesium: 2.1 mg/dL (ref 1.7–2.4)

## 2020-06-12 MED ORDER — TIZANIDINE HCL 2 MG PO TABS: 2.0000 mg | ORAL_TABLET | Freq: Three times a day (TID) | ORAL | 0 refills | Status: AC | PRN

## 2020-06-12 MED ORDER — TIZANIDINE HCL 2 MG PO TABS
2.0000 mg | ORAL_TABLET | Freq: Once | ORAL | Status: AC
  Administered 2020-06-12: 2 mg via ORAL
  Filled 2020-06-12 (×2): qty 1

## 2020-06-12 NOTE — ED Triage Notes (Signed)
Pt in via EMS from home with c/o left leg cramps for the past 12 hours. EMS reports pt stood up and her leg started cramping, no falls, no trauma

## 2020-06-12 NOTE — ED Provider Notes (Signed)
[facility] Emergency Department Provider Note  ____________________________________________   First MD Initiated Contact with Patient 06/12/20 1305     (approximate)  I have reviewed the triage vital signs and the nursing notes.   HISTORY  Chief Complaint Leg Pain  HPI Connie Frederick is a 86 y.o. female this emergency department for evaluation of left leg cramping.  The patient was seen a few months ago for the same complaint with a relatively negative work-up and states that she improved with IV fluids.  Today, she states the leg cramps been present for 2 days but were worse today than yesterday.  She believes that she is possibly dehydrated.  She is also complaining of some urinary frequency but denies dysuria, denies abdominal pain and denies flank pain.         Past Medical History:  Diagnosis Date  . Diabetes mellitus without complication (HCC)   . Hypertension   . Myocardial infarction (HCC)   . Vertigo    HOSPITALIZED 2 WEEKS AGO    Patient Active Problem List   Diagnosis Date Noted  . Vertigo 05/14/2018  . Varicose veins of leg with pain, bilateral 03/29/2017  . Swelling of limb 03/29/2017  . Hyperlipidemia 02/22/2017  . Diabetes (HCC) 02/22/2017  . Pain and swelling of lower extremity 02/22/2017    Past Surgical History:  Procedure Laterality Date  . ABDOMINAL HYSTERECTOMY    . CATARACT EXTRACTION W/PHACO Left 06/08/2018   Procedure: CATARACT EXTRACTION PHACO AND INTRAOCULAR LENS PLACEMENT (IOC);  Surgeon: Lockie Mola, MD;  Location: [facility] ORS;  Service: Ophthalmology;  Laterality: Left;  [place] 04:54 AP% 14.4 CDE 13.2 Fluid pack lot # [id number]  . CATARACT EXTRACTION W/PHACO Right 06/28/2018   Procedure: CATARACT EXTRACTION PHACO AND INTRAOCULAR LENS PLACEMENT (IOC) RIGHT DIABETIC;  Surgeon: Lockie Mola, MD;  Location: [facility];  Service: Ophthalmology;  Laterality: Right;  diabetic - oral meds  . VEIN  LIGATION      Prior to Admission medications   Medication Sig Start Date End Date Taking? Authorizing Provider  acetaminophen (TYLENOL) 650 MG CR tablet Take 650 mg by mouth every 8 (eight) hours as needed for pain.    [provider]  aspirin EC 81 MG tablet Take 81 mg by mouth daily.    [provider]  Cholecalciferol (D3-1000) 1000 units tablet Take 1,000 Units by mouth daily.    [provider]  Cyanocobalamin (VITAMIN B 12 PO) Take 1,000 mcg by mouth.    [provider]  donepezil (ARICEPT) 5 MG tablet donepezil 5 mg tablet    [provider]  gabapentin (NEURONTIN) 100 MG capsule Take 1 capsule (100 mg total) by mouth 3 (three) times daily as needed. 08/11/19 08/10/20  Sharyn Creamer, MD  glipiZIDE (GLUCOTROL XL) 2.5 MG 24 hr tablet Take 2.5 mg by mouth daily. 08/25/15   [provider]  losartan (COZAAR) 100 MG tablet Take 100 mg by mouth daily. 08/11/15   [provider]  meclizine (ANTIVERT) 25 MG tablet Take 1 tablet (25 mg total) by mouth 3 (three) times daily as needed for dizziness. 03/27/19   Willy Eddy, MD  meloxicam (MOBIC) 7.5 MG tablet meloxicam 7.5 mg tablet  Take 1 tablet every day by oral route.    [provider]  ondansetron (ZOFRAN ODT) 4 MG disintegrating tablet Take 1 tablet (4 mg total) by mouth every 8 (eight) hours as needed for nausea or vomiting. 08/12/18   Arnaldo Natal,  MD  polyethylene glycol powder (GLYCOLAX/MIRALAX) powder Take 17 g by mouth daily as needed for mild constipation.  10/14/15   [provider]  promethazine (PHENERGAN) 12.5 MG tablet Take 1 tablet (12.5 mg total) by mouth every 8 (eight) hours as needed for nausea or vomiting. 05/15/18   Enid Baas, MD  rosuvastatin (CRESTOR) 5 MG tablet Take 5 mg by mouth daily.    [provider]  tiZANidine (ZANAFLEX) 2 MG tablet Take 1 tablet (2 mg total) by mouth every 8 (eight) hours as needed for up to 10  days for muscle spasms. 06/12/20 06/22/20  Lucy Chris, PA    Allergies Patient has no known allergies.  Family History  Problem Relation Age of Onset  . Hypertension Brother     Social History Social History   Tobacco Use  . Smoking status: Never Smoker  . Smokeless tobacco: Never Used  Vaping Use  . Vaping Use: Never used  Substance Use Topics  . Alcohol use: No  . Drug use: No    Review of Systems Constitutional: No fever/chills Eyes: No visual changes. ENT: No sore throat. Cardiovascular: Denies chest pain. Respiratory: Denies shortness of breath. Gastrointestinal: No abdominal pain.  No nausea, no vomiting.  No diarrhea.  No constipation. Genitourinary: Negative for dysuria. + Urinary frequency Musculoskeletal: + Left lower extremity cramping Skin: Negative for rash. Neurological: Negative for headaches, focal weakness or numbness. ____________________________________________   PHYSICAL EXAM:  VITAL SIGNS: ED Triage Vitals [06/12/20 1237]  Enc Vitals Group     BP 118/69     Pulse Rate 72     Resp 18     Temp 98.2 F (36.8 C)     Temp Source Oral     SpO2 98 %     Weight 169 lb 12.1 oz (77 kg)     Height 5' (1.524 m)     Head Circumference      Peak Flow      Pain Score 8     Pain Loc      Pain Edu?      Excl. in GC?    Constitutional: Alert and oriented. Well appearing and in no acute distress. Eyes: Conjunctivae are normal.  Head: Atraumatic. Nose: No congestion/rhinnorhea. Mouth/Throat: Mucous membranes are moist.   Neck: No stridor.   Cardiovascular: Normal rate, regular rhythm. Grossly normal heart sounds.  Good peripheral circulation. Respiratory: Normal respiratory effort.  No retractions. Lungs CTAB. Gastrointestinal: Soft and nontender. No distention. No CVA tenderness. Musculoskeletal: There is no tenderness to palpation of the left lower extremity.  There is bilateral 1+ edema.  Patient has 5/5 strength of hip flexion, knee  flexion and extension, ankle plantarflexion and dorsiflexion.  Good pulses bilaterally Neurologic:  Normal speech and language. No gross focal neurologic deficits are appreciated. No gait instability. Skin:  Skin is warm, dry and intact. No rash noted. Psychiatric: Mood and affect are normal. Speech and behavior are normal.  ____________________________________________   LABS (all labs ordered are listed, but only abnormal results are displayed)  Labs Reviewed  BRAIN NATRIURETIC PEPTIDE - Abnormal; Notable for the following components:      Result Value   B Natriuretic Peptide 134.1 (*)    All other components within normal limits  CBC WITH DIFFERENTIAL/PLATELET - Abnormal; Notable for the following components:   Hemoglobin 11.8 (*)    All other components within normal limits  COMPREHENSIVE METABOLIC PANEL - Abnormal; Notable for the following components:   Total  Protein 6.4 (*)    GFR calc non Af Amer 58 (*)    All other components within normal limits  URINALYSIS, COMPLETE (UACMP) WITH MICROSCOPIC - Abnormal; Notable for the following components:   Color, Urine STRAW (*)    APPearance CLEAR (*)    All other components within normal limits  MAGNESIUM    ____________________________________________  RADIOLOGY  Official radiology report(s): US Venous Img Lower Unilateral Left  Result Date: 06/12/2020 CLINICAL DATA:  Left leg pain, edema cramping EXAM: LEFT LOWER EXTREMITY VENOUS DOPPLER ULTRASOUND TECHNIQUE: Gray-scale sonography with graded compression, as well as color Doppler and duplex ultrasound were performed to evaluate the lower extremity deep venous systems from the level of the common femoral vein and including the common femoral, femoral, profunda femoral, popliteal and calf veins including the posterior tibial, peroneal and gastrocnemius veins when visible. The superficial great saphenous vein was also interrogated. Spectral Doppler was utilized to evaluate flow at  rest and with distal augmentation maneuvers in the common femoral, femoral and popliteal veins. COMPARISON:  02/09/2020 FINDINGS: Contralateral Common Femoral Vein: Respiratory phasicity is normal and symmetric with the symptomatic side. No evidence of thrombus. Normal compressibility. Common Femoral Vein: No evidence of thrombus. Normal compressibility, respiratory phasicity and response to augmentation. Saphenofemoral Junction: No evidence of thrombus. Normal compressibility and flow on color Doppler imaging. Profunda Femoral Vein: No evidence of thrombus. Normal compressibility and flow on color Doppler imaging. Femoral Vein: No evidence of thrombus. Normal compressibility, respiratory phasicity and response to augmentation. Popliteal Vein: No evidence of thrombus. Normal compressibility, respiratory phasicity and response to augmentation. Calf Veins: No evidence of thrombus. Normal compressibility and flow on color Doppler imaging. IMPRESSION: No evidence of deep venous thrombosis. Electronically Signed   By: Judie PetitM.  Shick M.D.   On: 06/12/2020 14:43    ____________________________________________   INITIAL IMPRESSION / ASSESSMENT AND PLAN / ED COURSE  As part of my medical decision making, I reviewed the following data within the electronic MEDICAL RECORD NUMBER Nursing notes reviewed and incorporated and Notes from prior ED visits        Tavie Anselm Lisnoch is a 86 year old female who presents to the emergency department for evaluation of left leg cramping.  Exam is relatively benign except for some mild bilateral lower extremity edema.  Laboratory evaluation includes a urinalysis which is normal and demonstrates that specific gravity is 1.006 and that she is likely not dehydrated.  CBC is grossly within normal limits and CMP is also grossly normal.  Magnesium is 2.1 and BNP is only mildly elevated.  At this time, it does not appear that her leg cramping is due to any electrolyte dysfunction.  We will treat the  patient's cramps with a mild dose of Zanaflex.  Discussed with the patient the benefits and risks of this medicine in the geriatric population including increased risk for falls.  Patient was advised not to drive on this medication.  She already has a follow-up scheduled with primary care on Monday and will discuss cramping with them.  Patient is amenable with this plan and stable for discharge.      ____________________________________________   FINAL CLINICAL IMPRESSION(S) / ED DIAGNOSES  Final diagnoses:  Leg cramp     ED Discharge Orders         Ordered    tiZANidine (ZANAFLEX) 2 MG tablet  Every 8 hours PRN        06/12/20 1713          *Please note:  Brindy EYLEEN RAWLINSON was evaluated in Emergency Department on 06/12/2020 for the symptoms described in the history of present illness. She was evaluated in the context of the global COVID-19 pandemic, which necessitated consideration that the patient might be at risk for infection with the SARS-CoV-2 virus that causes COVID-19. Institutional protocols and algorithms that pertain to the evaluation of patients at risk for COVID-19 are in a state of rapid change based on information released by regulatory bodies including the CDC and federal and state organizations. These policies and algorithms were followed during the patient's care in the ED.  Some ED evaluations and interventions may be delayed as a result of limited staffing during and the pandemic.*   Note:  This document was prepared using Dragon voice recognition software and may include unintentional dictation errors.    Lucy Chris, PA 06/12/20 Mallie Snooks    Willy Eddy, MD 06/18/20 1018

## 2020-06-12 NOTE — ED Triage Notes (Signed)
Pt comes EMS with left leg cramps starting yesterday. Pt states no swelling, no injury, no fall. Pt AOx4, lives at home alone.

## 2020-07-10 ENCOUNTER — Other Ambulatory Visit: Payer: Self-pay

## 2020-07-10 ENCOUNTER — Encounter: Payer: Self-pay | Admitting: Podiatry

## 2020-07-10 ENCOUNTER — Ambulatory Visit (INDEPENDENT_AMBULATORY_CARE_PROVIDER_SITE_OTHER): Payer: Medicare Other | Admitting: Podiatry

## 2020-07-10 DIAGNOSIS — E0843 Diabetes mellitus due to underlying condition with diabetic autonomic (poly)neuropathy: Secondary | ICD-10-CM

## 2020-07-10 DIAGNOSIS — M79676 Pain in unspecified toe(s): Secondary | ICD-10-CM

## 2020-07-10 DIAGNOSIS — L608 Other nail disorders: Secondary | ICD-10-CM | POA: Diagnosis not present

## 2020-07-10 DIAGNOSIS — B351 Tinea unguium: Secondary | ICD-10-CM | POA: Diagnosis not present

## 2020-07-10 NOTE — Progress Notes (Signed)
This patient returns to my office for at risk foot care.  This patient requires this care by a professional since this patient will be at risk due to having diabetes and neuropathy.  This patient is unable to cut nails herself since the patient cannot reach her nails.These nails are painful walking and wearing shoes.  This patient presents for at risk foot care today.  General Appearance  Alert, conversant and in no acute stress.  Vascular  Dorsalis pedis and posterior tibial  pulses are weakly  palpable  bilaterally.  Capillary return is within normal limits  bilaterally. Temperature is within normal limits  bilaterally.  Neurologic  Senn-Weinstein monofilament wire test within normal limits  bilaterally. Muscle power within normal limits bilaterally.  Nails Thick disfigured discolored nails with subungual debris  from hallux to fifth toes bilaterally.Pincer nails hallux  B/L. No evidence of bacterial infection or drainage bilaterally.  Orthopedic  No limitations of motion  feet .  No crepitus or effusions noted.  No bony pathology or digital deformities noted. HAV with hammer toes 2  B/L.  Skin  normotropic skin with no porokeratosis noted bilaterally.  No signs of infections or ulcers noted.     Onychomycosis  Pain in right toes  Pain in left toes  Consent was obtained for treatment procedures.   Mechanical debridement of nails 1-5  bilaterally performed with a nail nipper.  Filed with dremel without incident.    Return office visit 10   weeks                    Told patient to return for periodic foot care and evaluation due to potential at risk complications.   Alianna Wurster DPM  

## 2020-09-18 ENCOUNTER — Other Ambulatory Visit: Payer: Self-pay

## 2020-09-18 ENCOUNTER — Encounter: Payer: Self-pay | Admitting: Podiatry

## 2020-09-18 ENCOUNTER — Ambulatory Visit (INDEPENDENT_AMBULATORY_CARE_PROVIDER_SITE_OTHER): Payer: Medicare Other | Admitting: Podiatry

## 2020-09-18 DIAGNOSIS — L608 Other nail disorders: Secondary | ICD-10-CM

## 2020-09-18 DIAGNOSIS — M79676 Pain in unspecified toe(s): Secondary | ICD-10-CM

## 2020-09-18 DIAGNOSIS — B351 Tinea unguium: Secondary | ICD-10-CM | POA: Diagnosis not present

## 2020-09-18 DIAGNOSIS — E0843 Diabetes mellitus due to underlying condition with diabetic autonomic (poly)neuropathy: Secondary | ICD-10-CM

## 2020-09-18 NOTE — Progress Notes (Signed)
This patient returns to my office for at risk foot care.  This patient requires this care by a professional since this patient will be at risk due to having diabetes and neuropathy.  This patient is unable to cut nails herself since the patient cannot reach her nails.These nails are painful walking and wearing shoes.  This patient presents for at risk foot care today.  General Appearance  Alert, conversant and in no acute stress.  Vascular  Dorsalis pedis and posterior tibial  pulses are weakly  palpable  bilaterally.  Capillary return is within normal limits  bilaterally. Temperature is within normal limits  bilaterally.  Neurologic  Senn-Weinstein monofilament wire test within normal limits  bilaterally. Muscle power within normal limits bilaterally.  Nails Thick disfigured discolored nails with subungual debris  from hallux to fifth toes bilaterally.Pincer nails hallux  B/L. No evidence of bacterial infection or drainage bilaterally.  Orthopedic  No limitations of motion  feet .  No crepitus or effusions noted.  No bony pathology or digital deformities noted. HAV with hammer toes 2  B/L.  Skin  normotropic skin with no porokeratosis noted bilaterally.  No signs of infections or ulcers noted.     Onychomycosis  Pain in right toes  Pain in left toes  Consent was obtained for treatment procedures.   Mechanical debridement of nails 1-5  bilaterally performed with a nail nipper.  Filed with dremel without incident.    Return office visit 10   weeks                    Told patient to return for periodic foot care and evaluation due to potential at risk complications.   Khyle Goodell DPM  

## 2020-11-05 ENCOUNTER — Ambulatory Visit
Admission: RE | Admit: 2020-11-05 | Discharge: 2020-11-05 | Disposition: A | Discharge status: Home / Self Care | Payer: Medicare Other | Source: Ambulatory Visit | Attending: Family Medicine | Admitting: Family Medicine

## 2020-11-05 ENCOUNTER — Other Ambulatory Visit: Payer: Self-pay | Admitting: Family Medicine

## 2020-11-05 DIAGNOSIS — R634 Abnormal weight loss: Secondary | ICD-10-CM | POA: Insufficient documentation

## 2020-11-27 ENCOUNTER — Ambulatory Visit (INDEPENDENT_AMBULATORY_CARE_PROVIDER_SITE_OTHER): Payer: Medicare Other | Admitting: Podiatry

## 2020-11-27 ENCOUNTER — Other Ambulatory Visit: Payer: Self-pay

## 2020-11-27 ENCOUNTER — Encounter: Payer: Self-pay | Admitting: Podiatry

## 2020-11-27 DIAGNOSIS — L608 Other nail disorders: Secondary | ICD-10-CM | POA: Diagnosis not present

## 2020-11-27 DIAGNOSIS — M79676 Pain in unspecified toe(s): Secondary | ICD-10-CM

## 2020-11-27 DIAGNOSIS — B351 Tinea unguium: Secondary | ICD-10-CM | POA: Diagnosis not present

## 2020-11-27 DIAGNOSIS — G629 Polyneuropathy, unspecified: Secondary | ICD-10-CM | POA: Diagnosis not present

## 2020-11-27 DIAGNOSIS — E0843 Diabetes mellitus due to underlying condition with diabetic autonomic (poly)neuropathy: Secondary | ICD-10-CM | POA: Diagnosis not present

## 2020-11-27 NOTE — Progress Notes (Signed)
This patient returns to my office for at risk foot care.  This patient requires this care by a professional since this patient will be at risk due to having diabetes and neuropathy.  This patient is unable to cut nails herself since the patient cannot reach her nails.These nails are painful walking and wearing shoes.  This patient presents for at risk foot care today.  General Appearance  Alert, conversant and in no acute stress.  Vascular  Dorsalis pedis and posterior tibial  pulses are weakly  palpable  bilaterally.  Capillary return is within normal limits  bilaterally. Temperature is within normal limits  bilaterally.  Neurologic  Senn-Weinstein monofilament wire test within normal limits  bilaterally. Muscle power within normal limits bilaterally.  Nails Thick disfigured discolored nails with subungual debris  from hallux to fifth toes bilaterally.Pincer nails hallux  B/L. No evidence of bacterial infection or drainage bilaterally.  Orthopedic  No limitations of motion  feet .  No crepitus or effusions noted.  No bony pathology or digital deformities noted. HAV with hammer toes 2  B/L.  Skin  normotropic skin with no porokeratosis noted bilaterally.  No signs of infections or ulcers noted.     Onychomycosis  Pain in right toes  Pain in left toes  Consent was obtained for treatment procedures.   Mechanical debridement of nails 1-5  bilaterally performed with a nail nipper.  Filed with dremel without incident.    Return office visit 10   weeks                    Told patient to return for periodic foot care and evaluation due to potential at risk complications.   Nashea Chumney DPM  

## 2021-02-05 ENCOUNTER — Encounter: Payer: Self-pay | Admitting: Podiatry

## 2021-02-05 ENCOUNTER — Ambulatory Visit (INDEPENDENT_AMBULATORY_CARE_PROVIDER_SITE_OTHER): Payer: Medicare Other | Admitting: Podiatry

## 2021-02-05 ENCOUNTER — Other Ambulatory Visit: Payer: Self-pay

## 2021-02-05 DIAGNOSIS — M79676 Pain in unspecified toe(s): Secondary | ICD-10-CM

## 2021-02-05 DIAGNOSIS — B351 Tinea unguium: Secondary | ICD-10-CM | POA: Diagnosis not present

## 2021-02-05 DIAGNOSIS — L608 Other nail disorders: Secondary | ICD-10-CM | POA: Diagnosis not present

## 2021-02-05 DIAGNOSIS — E0843 Diabetes mellitus due to underlying condition with diabetic autonomic (poly)neuropathy: Secondary | ICD-10-CM

## 2021-02-05 DIAGNOSIS — G629 Polyneuropathy, unspecified: Secondary | ICD-10-CM

## 2021-02-05 NOTE — Progress Notes (Signed)
This patient returns to my office for at risk foot care.  This patient requires this care by a professional since this patient will be at risk due to having diabetes and neuropathy.  This patient is unable to cut nails herself since the patient cannot reach her nails.These nails are painful walking and wearing shoes.  This patient presents for at risk foot care today.  General Appearance  Alert, conversant and in no acute stress.  Vascular  Dorsalis pedis and posterior tibial  pulses are weakly  palpable  bilaterally.  Capillary return is within normal limits  bilaterally. Temperature is within normal limits  bilaterally.  Neurologic  Senn-Weinstein monofilament wire test within normal limits  bilaterally. Muscle power within normal limits bilaterally.  Nails Thick disfigured discolored nails with subungual debris  from hallux to fifth toes bilaterally.Pincer nails hallux  B/L. No evidence of bacterial infection or drainage bilaterally.  Orthopedic  No limitations of motion  feet .  No crepitus or effusions noted.  No bony pathology or digital deformities noted. HAV with hammer toes 2  B/L.  Skin  normotropic skin with no porokeratosis noted bilaterally.  No signs of infections or ulcers noted.     Onychomycosis  Pain in right toes  Pain in left toes  Consent was obtained for treatment procedures.   Mechanical debridement of nails 1-5  bilaterally performed with a nail nipper.  Filed with dremel without incident.    Return office visit 10   weeks                    Told patient to return for periodic foot care and evaluation due to potential at risk complications.   Tamir Wallman DPM  

## 2021-02-18 ENCOUNTER — Other Ambulatory Visit: Payer: Self-pay | Admitting: Family Medicine

## 2021-02-18 DIAGNOSIS — R634 Abnormal weight loss: Secondary | ICD-10-CM

## 2021-02-18 DIAGNOSIS — K8020 Calculus of gallbladder without cholecystitis without obstruction: Secondary | ICD-10-CM

## 2021-02-25 ENCOUNTER — Ambulatory Visit
Admission: RE | Admit: 2021-02-25 | Discharge: 2021-02-25 | Disposition: A | Discharge status: Home / Self Care | Payer: Medicare Other | Source: Ambulatory Visit | Attending: Family Medicine | Admitting: Family Medicine

## 2021-02-25 ENCOUNTER — Other Ambulatory Visit: Payer: Self-pay

## 2021-02-25 DIAGNOSIS — K8020 Calculus of gallbladder without cholecystitis without obstruction: Secondary | ICD-10-CM | POA: Insufficient documentation

## 2021-02-25 DIAGNOSIS — R634 Abnormal weight loss: Secondary | ICD-10-CM | POA: Diagnosis not present

## 2021-04-16 ENCOUNTER — Other Ambulatory Visit: Payer: Self-pay

## 2021-04-16 ENCOUNTER — Ambulatory Visit (INDEPENDENT_AMBULATORY_CARE_PROVIDER_SITE_OTHER): Payer: Medicare Other | Admitting: Podiatry

## 2021-04-16 ENCOUNTER — Encounter: Payer: Self-pay | Admitting: Podiatry

## 2021-04-16 DIAGNOSIS — B351 Tinea unguium: Secondary | ICD-10-CM

## 2021-04-16 DIAGNOSIS — G629 Polyneuropathy, unspecified: Secondary | ICD-10-CM

## 2021-04-16 DIAGNOSIS — M79676 Pain in unspecified toe(s): Secondary | ICD-10-CM

## 2021-04-16 DIAGNOSIS — E0843 Diabetes mellitus due to underlying condition with diabetic autonomic (poly)neuropathy: Secondary | ICD-10-CM | POA: Diagnosis not present

## 2021-04-16 DIAGNOSIS — L608 Other nail disorders: Secondary | ICD-10-CM

## 2021-04-16 NOTE — Progress Notes (Signed)
This patient returns to my office for at risk foot care.  This patient requires this care by a professional since this patient will be at risk due to having diabetes and neuropathy.  This patient is unable to cut nails herself since the patient cannot reach her nails.These nails are painful walking and wearing shoes.  This patient presents for at risk foot care today.  General Appearance  Alert, conversant and in no acute stress.  Vascular  Dorsalis pedis and posterior tibial  pulses are weakly  palpable  bilaterally.  Capillary return is within normal limits  bilaterally. Temperature is within normal limits  bilaterally.  Neurologic  Senn-Weinstein monofilament wire test within normal limits  bilaterally. Muscle power within normal limits bilaterally.  Nails Thick disfigured discolored nails with subungual debris  from hallux to fifth toes bilaterally.Pincer nails hallux  B/L. No evidence of bacterial infection or drainage bilaterally.  Orthopedic  No limitations of motion  feet .  No crepitus or effusions noted.  No bony pathology or digital deformities noted. HAV with hammer toes 2  B/L.  Skin  normotropic skin with no porokeratosis noted bilaterally.  No signs of infections or ulcers noted.     Onychomycosis  Pain in right toes  Pain in left toes  Consent was obtained for treatment procedures.   Mechanical debridement of nails 1-5  bilaterally performed with a nail nipper.  Filed with dremel without incident.    Return office visit 10   weeks                    Told patient to return for periodic foot care and evaluation due to potential at risk complications.   Blayklee Mable DPM  

## 2021-05-12 ENCOUNTER — Emergency Department: Payer: Medicare Other

## 2021-05-12 ENCOUNTER — Other Ambulatory Visit: Payer: Self-pay

## 2021-05-12 ENCOUNTER — Observation Stay (HOSPITAL_BASED_OUTPATIENT_CLINIC_OR_DEPARTMENT_OTHER)
Admit: 2021-05-12 | Discharge: 2021-05-12 | Disposition: A | Discharge status: Home / Self Care | Payer: Medicare Other | Attending: Internal Medicine | Admitting: Internal Medicine

## 2021-05-12 ENCOUNTER — Observation Stay
Admission: EM | Admit: 2021-05-12 | Discharge: 2021-05-13 | Disposition: A | Discharge status: Home / Self Care | Payer: Medicare Other | Attending: Internal Medicine | Admitting: Internal Medicine

## 2021-05-12 DIAGNOSIS — Z7984 Long term (current) use of oral hypoglycemic drugs: Secondary | ICD-10-CM | POA: Insufficient documentation

## 2021-05-12 DIAGNOSIS — Z79899 Other long term (current) drug therapy: Secondary | ICD-10-CM | POA: Diagnosis not present

## 2021-05-12 DIAGNOSIS — E785 Hyperlipidemia, unspecified: Secondary | ICD-10-CM | POA: Insufficient documentation

## 2021-05-12 DIAGNOSIS — R42 Dizziness and giddiness: Principal | ICD-10-CM | POA: Insufficient documentation

## 2021-05-12 DIAGNOSIS — R778 Other specified abnormalities of plasma proteins: Secondary | ICD-10-CM | POA: Diagnosis not present

## 2021-05-12 DIAGNOSIS — R600 Localized edema: Secondary | ICD-10-CM | POA: Insufficient documentation

## 2021-05-12 DIAGNOSIS — E119 Type 2 diabetes mellitus without complications: Secondary | ICD-10-CM | POA: Insufficient documentation

## 2021-05-12 DIAGNOSIS — I10 Essential (primary) hypertension: Secondary | ICD-10-CM

## 2021-05-12 DIAGNOSIS — Z7982 Long term (current) use of aspirin: Secondary | ICD-10-CM | POA: Insufficient documentation

## 2021-05-12 DIAGNOSIS — F0390 Unspecified dementia without behavioral disturbance: Secondary | ICD-10-CM | POA: Diagnosis not present

## 2021-05-12 DIAGNOSIS — I5031 Acute diastolic (congestive) heart failure: Secondary | ICD-10-CM | POA: Diagnosis not present

## 2021-05-12 DIAGNOSIS — R531 Weakness: Secondary | ICD-10-CM | POA: Diagnosis present

## 2021-05-12 DIAGNOSIS — I639 Cerebral infarction, unspecified: Secondary | ICD-10-CM

## 2021-05-12 DIAGNOSIS — Z20822 Contact with and (suspected) exposure to covid-19: Secondary | ICD-10-CM | POA: Diagnosis not present

## 2021-05-12 LAB — BASIC METABOLIC PANEL
Anion gap: 9 (ref 5–15)
BUN: 25 mg/dL — ABNORMAL HIGH (ref 8–23)
CO2: 28 mmol/L (ref 22–32)
Calcium: 9.7 mg/dL (ref 8.9–10.3)
Chloride: 104 mmol/L (ref 98–111)
Creatinine, Ser: 1.06 mg/dL — ABNORMAL HIGH (ref 0.44–1.00)
GFR, Estimated: 51 mL/min — ABNORMAL LOW (ref >60)
Glucose, Bld: 91 mg/dL (ref 70–99)
Potassium: 4 mmol/L (ref 3.5–5.1)
Sodium: 141 mmol/L (ref 135–145)

## 2021-05-12 LAB — URINALYSIS, COMPLETE (UACMP) WITH MICROSCOPIC
Bilirubin Urine: NEGATIVE
Glucose, UA: NEGATIVE mg/dL
Hgb urine dipstick: NEGATIVE
Ketones, ur: NEGATIVE mg/dL
Leukocytes,Ua: NEGATIVE
Nitrite: NEGATIVE
Protein, ur: NEGATIVE mg/dL
Specific Gravity, Urine: 1.011 (ref 1.005–1.030)
pH: 5 (ref 5.0–8.0)

## 2021-05-12 LAB — TROPONIN I (HIGH SENSITIVITY)
Troponin I (High Sensitivity): 24 ng/L — ABNORMAL HIGH (ref <18)
Troponin I (High Sensitivity): 30 ng/L — ABNORMAL HIGH (ref <18)

## 2021-05-12 LAB — CBC
HCT: 36.1 % (ref 36.0–46.0)
Hemoglobin: 11.9 g/dL — ABNORMAL LOW (ref 12.0–15.0)
MCH: 28.3 pg (ref 26.0–34.0)
MCHC: 33 g/dL (ref 30.0–36.0)
MCV: 85.7 fL (ref 80.0–100.0)
Platelets: 182 10*3/uL (ref 150–400)
RBC: 4.21 MIL/uL (ref 3.87–5.11)
RDW: 13.5 % (ref 11.5–15.5)
WBC: 4.6 10*3/uL (ref 4.0–10.5)
nRBC: 0 % (ref 0.0–0.2)

## 2021-05-12 LAB — RESP PANEL BY RT-PCR (FLU A&B, COVID) ARPGX2
Influenza A by PCR: NEGATIVE
Influenza B by PCR: NEGATIVE
SARS Coronavirus 2 by RT PCR: NEGATIVE

## 2021-05-12 LAB — HEMOGLOBIN A1C
Hgb A1c MFr Bld: 6.1 % — ABNORMAL HIGH (ref 4.8–5.6)
Mean Plasma Glucose: 128.37 mg/dL

## 2021-05-12 LAB — CBG MONITORING, ED
Glucose-Capillary: 81 mg/dL (ref 70–99)
Glucose-Capillary: 115 mg/dL — ABNORMAL HIGH (ref 70–99)

## 2021-05-12 LAB — GLUCOSE, CAPILLARY: Glucose-Capillary: 109 mg/dL — ABNORMAL HIGH (ref 70–99)

## 2021-05-12 LAB — BRAIN NATRIURETIC PEPTIDE: B Natriuretic Peptide: 86.5 pg/mL (ref 0.0–100.0)

## 2021-05-12 MED ORDER — INSULIN ASPART 100 UNIT/ML IJ SOLN
0.0000 [IU] | Freq: Three times a day (TID) | INTRAMUSCULAR | Status: DC
  Administered 2021-05-13: 2 [IU] via SUBCUTANEOUS
  Filled 2021-05-12: qty 1

## 2021-05-12 MED ORDER — ASPIRIN EC 81 MG PO TBEC
81.0000 mg | DELAYED_RELEASE_TABLET | Freq: Every day | ORAL | Status: DC
  Administered 2021-05-13: 81 mg via ORAL
  Filled 2021-05-12 (×2): qty 1

## 2021-05-12 MED ORDER — ROSUVASTATIN CALCIUM 10 MG PO TABS
5.0000 mg | ORAL_TABLET | Freq: Every day | ORAL | Status: DC
  Administered 2021-05-12 – 2021-05-13 (×2): 5 mg via ORAL
  Filled 2021-05-12 (×2): qty 1

## 2021-05-12 MED ORDER — FUROSEMIDE 10 MG/ML IJ SOLN
40.0000 mg | Freq: Once | INTRAMUSCULAR | Status: AC
  Administered 2021-05-12: 40 mg via INTRAVENOUS
  Filled 2021-05-12: qty 4

## 2021-05-12 MED ORDER — MECLIZINE HCL 25 MG PO TABS: 25.0000 mg | ORAL_TABLET | Freq: Three times a day (TID) | ORAL | Status: DC | PRN

## 2021-05-12 MED ORDER — MECLIZINE HCL 25 MG PO TABS
25.0000 mg | ORAL_TABLET | Freq: Three times a day (TID) | ORAL | Status: DC
  Administered 2021-05-12 – 2021-05-13 (×3): 25 mg via ORAL
  Filled 2021-05-12 (×4): qty 1

## 2021-05-12 MED ORDER — FUROSEMIDE 10 MG/ML IJ SOLN: 60.0000 mg | Freq: Once | INTRAMUSCULAR | Status: DC

## 2021-05-12 MED ORDER — ACETAMINOPHEN 325 MG PO TABS: 650.0000 mg | ORAL_TABLET | Freq: Three times a day (TID) | ORAL | Status: DC | PRN

## 2021-05-12 MED ORDER — POLYETHYLENE GLYCOL 3350 17 G PO PACK: 17.0000 g | PACK | Freq: Every day | ORAL | Status: DC | PRN

## 2021-05-12 MED ORDER — ONDANSETRON 4 MG PO TBDP: 4.0000 mg | ORAL_TABLET | Freq: Three times a day (TID) | ORAL | Status: DC | PRN

## 2021-05-12 MED ORDER — INSULIN ASPART 100 UNIT/ML IJ SOLN: 0.0000 [IU] | Freq: Every day | INTRAMUSCULAR | Status: DC

## 2021-05-12 MED ORDER — ENOXAPARIN SODIUM 40 MG/0.4ML IJ SOSY
40.0000 mg | PREFILLED_SYRINGE | INTRAMUSCULAR | Status: DC
  Administered 2021-05-12: 40 mg via SUBCUTANEOUS
  Filled 2021-05-12: qty 0.4

## 2021-05-12 NOTE — ED Provider Notes (Signed)
[facility] Emergency Department Provider Note    Event Date/Time   First MD Initiated Contact with Patient 05/12/21 1107     (approximate)  I have reviewed the triage vital signs and the nursing notes.   HISTORY  Chief Complaint Weakness    HPI Connie Frederick is a 87 y.o. female with below listed past medical history presents to the ER for evaluation of generalized weakness and dizziness that occurred when she woke up she also noticing worsening swelling in her lower extremities that is new.  Denies any chest pain or pressure.  States she has a history of vertigo and slightly different as she does not feel like the room is spinning is much as she felt like she was having weakness in both of her legs this morning and had trouble walking.  Denies any other numbness or tingling.  No headaches.  No visual disturbance.  States that she has been having increasing urinary frequency denies any fevers or back pain.  Past Medical History:  Diagnosis Date   Diabetes mellitus without complication (HCC)    Hypertension    Myocardial infarction (HCC)    Vertigo    HOSPITALIZED 2 WEEKS AGO   Family History  Problem Relation Age of Onset   Hypertension Brother    Past Surgical History:  Procedure Laterality Date   ABDOMINAL HYSTERECTOMY     CATARACT EXTRACTION W/PHACO Left 06/08/2018   Procedure: CATARACT EXTRACTION PHACO AND INTRAOCULAR LENS PLACEMENT (IOC);  Surgeon: Lockie Mola, MD;  Location: [facility] ORS;  Service: Ophthalmology;  Laterality: Left;  [place] 04:54 AP% 14.4 CDE 13.2 Fluid pack lot # [id number]   CATARACT EXTRACTION W/PHACO Right 06/28/2018   Procedure: CATARACT EXTRACTION PHACO AND INTRAOCULAR LENS PLACEMENT (IOC) RIGHT DIABETIC;  Surgeon: Lockie Mola, MD;  Location: [facility];  Service: Ophthalmology;  Laterality: Right;  diabetic - oral meds   VEIN LIGATION     Patient Active Problem List   Diagnosis Date Noted   Weakness  05/12/2021   Vertigo 05/14/2018   Varicose veins of leg with pain, bilateral 03/29/2017   Swelling of limb 03/29/2017   Hyperlipidemia 02/22/2017   Diabetes (HCC) 02/22/2017   Pain and swelling of lower extremity 02/22/2017      Prior to Admission medications   Medication Sig Start Date End Date Taking? Authorizing Provider  acetaminophen (TYLENOL) 650 MG CR tablet Take 650 mg by mouth every 8 (eight) hours as needed for pain.    [provider]  aspirin EC 81 MG tablet Take 81 mg by mouth daily.    [provider]  Cholecalciferol 25 MCG (1000 UT) tablet Take 1,000 Units by mouth daily.    [provider]  Cyanocobalamin (VITAMIN B 12 PO) Take 1,000 mcg by mouth.    [provider]  donepezil (ARICEPT) 5 MG tablet donepezil 5 mg tablet    [provider]  gabapentin (NEURONTIN) 100 MG capsule Take 1 capsule (100 mg total) by mouth 3 (three) times daily as needed. 08/11/19 08/10/20  Sharyn Creamer, MD  glipiZIDE (GLUCOTROL XL) 2.5 MG 24 hr tablet Take 2.5 mg by mouth daily. 08/25/15   [provider]  losartan (COZAAR) 100 MG tablet Take 100 mg by mouth daily. 08/11/15   [provider]  meclizine (ANTIVERT) 25 MG tablet Take 1 tablet (25 mg total) by mouth 3 (three) times daily as needed for dizziness. 03/27/19   Willy Eddy, MD  meloxicam (MOBIC) 7.5 MG tablet  meloxicam 7.5 mg tablet  Take 1 tablet every day by oral route.    [provider]  ondansetron (ZOFRAN ODT) 4 MG disintegrating tablet Take 1 tablet (4 mg total) by mouth every 8 (eight) hours as needed for nausea or vomiting. 08/12/18   Arnaldo Natal, MD  polyethylene glycol powder (GLYCOLAX/MIRALAX) powder Take 17 g by mouth daily as needed for mild constipation.  10/14/15   [provider]  promethazine (PHENERGAN) 12.5 MG tablet Take 1 tablet (12.5 mg total) by mouth every 8 (eight) hours as needed for nausea or vomiting. 05/15/18   Enid Baas, MD  rosuvastatin (CRESTOR) 5 MG tablet Take 5 mg by mouth daily.    [provider]    Allergies Patient has no known allergies.    Social History Social History   Tobacco Use   Smoking status: Never   Smokeless tobacco: Never  Vaping Use   Vaping Use: Never used  Substance Use Topics   Alcohol use: No   Drug use: No    Review of Systems Patient denies headaches, rhinorrhea, blurry vision, numbness, shortness of breath, chest pain, edema, cough, abdominal pain, nausea, vomiting, diarrhea, dysuria, fevers, rashes or hallucinations unless otherwise stated above in HPI. ____________________________________________   PHYSICAL EXAM:  VITAL SIGNS: Vitals:   05/12/21 1517 05/12/21 1551  BP: (!) 168/66 (!) 177/57  Pulse: 60 66  Resp: 17 16  Temp: 97.6 F (36.4 C)   SpO2: 100% 97%    Constitutional: Alert and oriented.  Eyes: Conjunctivae are normal.  Head: Atraumatic. Nose: No congestion/rhinnorhea. Mouth/Throat: Mucous membranes are moist.   Neck: No stridor. Painless ROM.  Cardiovascular: Normal rate, regular rhythm. Grossly normal heart sounds.  Good peripheral circulation. Respiratory: Normal respiratory effort.  No retractions. Lungs CTAB. Gastrointestinal: Soft and nontender. No distention. No abdominal bruits. No CVA tenderness. Genitourinary:  Musculoskeletal: No lower extremity tenderness nor edema.  No joint effusions. Neurologic:  CN- intact.  No facial droop, Normal FNF.  Normal heel to shin.  Sensation intact bilaterally. Normal speech and language. No gross focal neurologic deficits are appreciated. No gait instability. Skin:  Skin is warm, dry and intact. No rash noted. Psychiatric: Mood and affect are normal. Speech and behavior are normal.  ____________________________________________   LABS (all labs ordered are listed, but only abnormal results are displayed)  Results for orders placed or performed during the hospital encounter  of 05/12/21 (from the past 24 hour(s))  Basic metabolic panel     Status: Abnormal   Collection Time: 05/12/21 10:00 AM  Result Value Ref Range   Sodium 141 135 - 145 mmol/L   Potassium 4.0 3.5 - 5.1 mmol/L   Chloride 104 98 - 111 mmol/L   CO2 28 22 - 32 mmol/L   Glucose, Bld 91 70 - 99 mg/dL   BUN 25 (H) 8 - 23 mg/dL   Creatinine, Ser 0.62 (H) 0.44 - 1.00 mg/dL   Calcium 9.7 8.9 - 37.6 mg/dL   GFR, Estimated 51 (L) >60 mL/min   Anion gap 9 5 - 15  CBC     Status: Abnormal   Collection Time: 05/12/21 10:00 AM  Result Value Ref Range   WBC 4.6 4.0 - 10.5 K/uL   RBC 4.21 3.87 - 5.11 MIL/uL   Hemoglobin 11.9 (L) 12.0 - 15.0 g/dL   HCT 28.3 15.1 - 76.1 %   MCV 85.7 80.0 - 100.0 fL   MCH 28.3 26.0 - 34.0 pg   MCHC  33.0 30.0 - 36.0 g/dL   RDW 16.113.5 09.611.5 - 04.515.5 %   Platelets 182 150 - 400 K/uL   nRBC 0.0 0.0 - 0.2 %  Troponin I (High Sensitivity)     Status: Abnormal   Collection Time: 05/12/21 10:00 AM  Result Value Ref Range   Troponin I (High Sensitivity) 24 (H) <18 ng/L  Brain natriuretic peptide     Status: None   Collection Time: 05/12/21 10:00 AM  Result Value Ref Range   B Natriuretic Peptide 86.5 0.0 - 100.0 pg/mL  CBG monitoring, ED     Status: None   Collection Time: 05/12/21 11:45 AM  Result Value Ref Range   Glucose-Capillary 81 70 - 99 mg/dL  Urinalysis, Complete w Microscopic Urine, Random     Status: Abnormal   Collection Time: 05/12/21 11:54 AM  Result Value Ref Range   Color, Urine STRAW (A) YELLOW   APPearance CLEAR (A) CLEAR   Specific Gravity, Urine 1.011 1.005 - 1.030   pH 5.0 5.0 - 8.0   Glucose, UA NEGATIVE NEGATIVE mg/dL   Hgb urine dipstick NEGATIVE NEGATIVE   Bilirubin Urine NEGATIVE NEGATIVE   Ketones, ur NEGATIVE NEGATIVE mg/dL   Protein, ur NEGATIVE NEGATIVE mg/dL   Nitrite NEGATIVE NEGATIVE   Leukocytes,Ua NEGATIVE NEGATIVE   RBC / HPF 0-5 0 - 5 RBC/hpf   WBC, UA 0-5 0 - 5 WBC/hpf   Bacteria, UA RARE (A) NONE SEEN   Squamous Epithelial /  LPF 0-5 0 - 5   Mucus PRESENT    Hyaline Casts, UA PRESENT   Troponin I (High Sensitivity)     Status: Abnormal   Collection Time: 05/12/21  2:15 PM  Result Value Ref Range   Troponin I (High Sensitivity) 30 (H) <18 ng/L   ____________________________________________  EKG My review and personal interpretation at Time: 10:16   Indication: weakness  Rate: 60  Rhythm: sinus Axis: left Other: lvh criteria, no stemi ____________________________________________  RADIOLOGY  I personally reviewed all radiographic images ordered to evaluate for the above acute complaints and reviewed radiology reports and findings.  These findings were personally discussed with the patient.  Please see medical record for radiology report.  ____________________________________________   PROCEDURES  Procedure(s) performed:  Procedures    Critical Care performed: no ____________________________________________   INITIAL IMPRESSION / ASSESSMENT AND PLAN / ED COURSE  Pertinent labs & imaging results that were available during my care of the patient were reviewed by me and considered in my medical decision making (see chart for details).   DDX: electrolyte abn, uti, chf, acs, dysrhythmia, anemia, cva, sdh,  Astaria Judie PetitM Anselm Lisnoch is a 87 y.o. who presents to the ED with sensation as described above.  Patient clinically does not appear toxic she is afebrile but hypertensive no hypoxia.  Does have some crackles on exam does have lower extremity edema.  Blood will be sent for above differential.  CT imaging ordered to further evaluate for Cenestin we have a lower suspicion for CVA.  EKG with nonspecific changes and lvh criteria  Clinical Course as of 05/12/21 1606  Tue May 12, 2021  1525 Troponin slightly elevated from prior but not more suggestive of findings consistent with CHF though her BNP is normal.  With her lower extremity edema and chest x-ray finding suggesting mild edema with no prior history of CHF and  hypertension will give dose of IV Lasix.  Discussed options with patient and and family member at bedside and family member reporting  significant functional decline over past few days does not feel comfortable with her going home which is reasonable given her age.  Will order Lasix will consult with hospitalist for observation and further medical workup. [PR]    Clinical Course User Index [PR] Willy Eddy, MD    The patient was evaluated in Emergency Department today for the symptoms described in the history of present illness. He/she was evaluated in the context of the global COVID-19 pandemic, which necessitated consideration that the patient might be at risk for infection with the SARS-CoV-2 virus that causes COVID-19. Institutional protocols and algorithms that pertain to the evaluation of patients at risk for COVID-19 are in a state of rapid change based on information released by regulatory bodies including the CDC and federal and state organizations. These policies and algorithms were followed during the patient's care in the ED.  As part of my medical decision making, I reviewed the following data within the electronic MEDICAL RECORD NUMBER Nursing notes reviewed and incorporated, Labs reviewed, notes from prior ED visits and [place] Controlled Substance Database   ____________________________________________   FINAL CLINICAL IMPRESSION(S) / ED DIAGNOSES  Final diagnoses:  Weakness  Lower extremity edema      NEW MEDICATIONS STARTED DURING THIS VISIT:  New Prescriptions   No medications on file     Note:  This document was prepared using Dragon voice recognition software and may include unintentional dictation errors.    Willy Eddy, MD 05/12/21 [phone]

## 2021-05-12 NOTE — H&P (Signed)
History and Physical    DRU LAUREL OJJ:[id number] DOB: 1933-08-07 DOA: 05/12/2021  PCP: [name], MD  Patient coming from: Home  Chief Complaint: Dizziness, weakness today, lower extremity edema  HPI: Connie Frederick is a 87 y.o. female with medical history significant of diabetes melitis, hypertension, vertigo presents complaining of dizziness, acute weakness today more than her usual and lower extremity edema.  Denies any shortness of breath, chest pain, lightheadedness, syncope or presyncopal episode.  Denies any cough, fever, or orthopnea. She does report having history of vertigo but she feels this is different than her vertigo.  ED Course: Blood pressure 177/57, heart rate 60 respiratory rate 20 afebrile, 100% on room air.  Chest x-ray with possible interstitial markings.  CT of the head without any acute intracranial abnormality.  WBC 4.6.  BNP 86.5.  Troponin elevated at 30.  Creatinine 1.06.  BUN 25.  Sodium 141.  ER gave patient Lasix 40 mg IV x1. EKG personally reviewed sinus bradycardia.  No acute ST changes UA negative for leukocytes and nitrates.  Rare bacteria.  COVID SARS test pending  Review of Systems: All systems reviewed and otherwise negative.    Past Medical History:  Diagnosis Date   Diabetes mellitus without complication (HCC)    Hypertension    Myocardial infarction (HCC)    Vertigo    HOSPITALIZED 2 WEEKS AGO    Past Surgical History:  Procedure Laterality Date   ABDOMINAL HYSTERECTOMY     CATARACT EXTRACTION W/PHACO Left 06/08/2018   Procedure: CATARACT EXTRACTION PHACO AND INTRAOCULAR LENS PLACEMENT (IOC);  Surgeon: Lockie Mola, MD;  Location: [facility] ORS;  Service: Ophthalmology;  Laterality: Left;  [place] 04:54 AP% 14.4 CDE 13.2 Fluid pack lot # [id number]   CATARACT EXTRACTION W/PHACO Right 06/28/2018   Procedure: CATARACT EXTRACTION PHACO AND INTRAOCULAR LENS PLACEMENT (IOC) RIGHT DIABETIC;  Surgeon: Lockie Mola, MD;  Location: [facility];  Service: Ophthalmology;  Laterality: Right;  diabetic - oral meds   VEIN LIGATION       reports that she has never smoked. She has never used smokeless tobacco. She reports that she does not drink alcohol and does not use drugs.  No Known Allergies  Family History  Problem Relation Age of Onset   Hypertension Brother      Prior to Admission medications   Medication Sig Start Date End Date Taking? Authorizing Provider  acetaminophen (TYLENOL) 650 MG CR tablet Take 650 mg by mouth every 8 (eight) hours as needed for pain.    [provider]  aspirin EC 81 MG tablet Take 81 mg by mouth daily.    [provider]  Cholecalciferol 25 MCG (1000 UT) tablet Take 1,000 Units by mouth daily.    [provider]  Cyanocobalamin (VITAMIN B 12 PO) Take 1,000 mcg by mouth.    [provider]  donepezil (ARICEPT) 5 MG tablet donepezil 5 mg tablet    [provider]  gabapentin (NEURONTIN) 100 MG capsule Take 1 capsule (100 mg total) by mouth 3 (three) times daily as needed. 08/11/19 08/10/20  Sharyn Creamer, MD  glipiZIDE (GLUCOTROL XL) 2.5 MG 24 hr tablet Take 2.5 mg by mouth daily. 08/25/15   [provider]  losartan (COZAAR) 100 MG tablet Take 100 mg by mouth daily. 08/11/15   [provider]  meclizine (ANTIVERT) 25 MG tablet Take 1 tablet (25 mg total) by mouth 3 (three) times daily as needed for dizziness. 03/27/19   Willy Eddy,  MD  meloxicam (MOBIC) 7.5 MG tablet meloxicam 7.5 mg tablet  Take 1 tablet every day by oral route.    [provider]  ondansetron (ZOFRAN ODT) 4 MG disintegrating tablet Take 1 tablet (4 mg total) by mouth every 8 (eight) hours as needed for nausea or vomiting. 08/12/18   Arnaldo Natal, MD  polyethylene glycol powder (GLYCOLAX/MIRALAX) powder Take 17 g by mouth daily as needed for mild constipation.  10/14/15   [provider]  promethazine (PHENERGAN) 12.5 MG tablet Take 1  tablet (12.5 mg total) by mouth every 8 (eight) hours as needed for nausea or vomiting. 05/15/18   Enid Baas, MD  rosuvastatin (CRESTOR) 5 MG tablet Take 5 mg by mouth daily.    [provider]    Physical Exam: Vitals:   05/12/21 0959 05/12/21 1454 05/12/21 1517 05/12/21 1551  BP:   (!) 168/66 (!) 177/57  Pulse: 60 62 60 66  Resp: 20 18 17 16   Temp: 98.5 F (36.9 C)  97.6 F (36.4 C)   TempSrc: Oral  Oral   SpO2: 100% 100% 100% 97%  Weight: 70.3 kg     Height: 5' (1.524 m)       Constitutional: NAD, calm, comfortable Vitals:   05/12/21 0959 05/12/21 1454 05/12/21 1517 05/12/21 1551  BP:   (!) 168/66 (!) 177/57  Pulse: 60 62 60 66  Resp: 20 18 17 16   Temp: 98.5 F (36.9 C)  97.6 F (36.4 C)   TempSrc: Oral  Oral   SpO2: 100% 100% 100% 97%  Weight: 70.3 kg     Height: 5' (1.524 m)      Eyes: PERRL, lids and conjunctivae normal ENMT: Mucous membranes are moist. Posterior pharynx clear of any exudate or lesions Neck: normal, supple Respiratory: clear to auscultation bilaterally, no wheezing, no crackles. Normal respiratory effort. No accessory muscle use.  Cardiovascular: Regular rate and rhythm, no murmurs / rubs / gallops.  Abdomen: no tenderness, no masses palpated. No hepatosplenomegaly. Bowel sounds positive.  Musculoskeletal: Mild lower extremity edema  skin: Warm dry Neurologic: CN 2-12 grossly intact.  Awake and alert and oriented  psychiatric: Normal judgment and insight. Normal mood.    Labs on Admission: I have personally reviewed following labs and imaging studies  CBC: Recent Labs  Lab 05/12/21 1000  WBC 4.6  HGB 11.9*  HCT 36.1  MCV 85.7  PLT 182   Basic Metabolic Panel: Recent Labs  Lab 05/12/21 1000  NA 141  K 4.0  CL 104  CO2 28  GLUCOSE 91  BUN 25*  CREATININE 1.06*  CALCIUM 9.7   GFR: Estimated Creatinine Clearance: 33.3 mL/min (A) (by C-G formula based on SCr of 1.06 mg/dL (H)). Liver Function Tests: No results  for input(s): AST, ALT, ALKPHOS, BILITOT, PROT, ALBUMIN in the last 168 hours. No results for input(s): LIPASE, AMYLASE in the last 168 hours. No results for input(s): AMMONIA in the last 168 hours. Coagulation Profile: No results for input(s): INR, PROTIME in the last 168 hours. Cardiac Enzymes: No results for input(s): CKTOTAL, CKMB, CKMBINDEX, TROPONINI in the last 168 hours. BNP (last 3 results) No results for input(s): PROBNP in the last 8760 hours. HbA1C: No results for input(s): HGBA1C in the last 72 hours. CBG: Recent Labs  Lab 05/12/21 1145  GLUCAP 81   Lipid Profile: No results for input(s): CHOL, HDL, LDLCALC, TRIG, CHOLHDL, LDLDIRECT in the last 72 hours. Thyroid Function Tests: No results for input(s): TSH, T4TOTAL,  FREET4, T3FREE, THYROIDAB in the last 72 hours. Anemia Panel: No results for input(s): VITAMINB12, FOLATE, FERRITIN, TIBC, IRON, RETICCTPCT in the last 72 hours. Urine analysis:    Component Value Date/Time   COLORURINE STRAW (A) 05/12/2021 1154   APPEARANCEUR CLEAR (A) 05/12/2021 1154   APPEARANCEUR Clear 12/31/2013 0121   LABSPEC 1.011 05/12/2021 1154   LABSPEC 1.010 12/31/2013 0121   PHURINE 5.0 05/12/2021 1154   GLUCOSEU NEGATIVE 05/12/2021 1154   GLUCOSEU Negative 12/31/2013 0121   HGBUR NEGATIVE 05/12/2021 1154   BILIRUBINUR NEGATIVE 05/12/2021 1154   BILIRUBINUR Negative 12/31/2013 0121   KETONESUR NEGATIVE 05/12/2021 1154   PROTEINUR NEGATIVE 05/12/2021 1154   NITRITE NEGATIVE 05/12/2021 1154   LEUKOCYTESUR NEGATIVE 05/12/2021 1154   LEUKOCYTESUR 1+ 12/31/2013 0121    Radiological Exams on Admission: CT HEAD WO CONTRAST (5MM)  Result Date: 05/12/2021 CLINICAL DATA:  Mental status change, unknown cause. EXAM: CT HEAD WITHOUT CONTRAST TECHNIQUE: Contiguous axial images were obtained from the base of the skull through the vertex without intravenous contrast. COMPARISON:  05/14/2018 FINDINGS: Brain: Chronic low-density in the white matter.  Chronic focal area of low-density in the right cerebellar hemisphere compatible with old insult. No evidence for acute hemorrhage, mass lesion, midline shift, hydrocephalus or new large infarct. Vascular: No hyperdense vessel or unexpected calcification. Skull: Normal. Negative for fracture or focal lesion. Sinuses/Orbits: No acute finding. Other: None. IMPRESSION: 1. No acute intracranial abnormality. 2. Evidence for chronic small vessel ischemic changes. 3. 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 Electronically Signed   By: Richarda OverlieAdam  Henn M.D.   On: 05/12/2021 12:47   US Venous Img Lower Bilateral  Result Date: 05/12/2021 CLINICAL DATA:  Bilateral lower extremity edema.  Evaluate for DVT. EXAM: BILATERAL LOWER EXTREMITY VENOUS DOPPLER ULTRASOUND TECHNIQUE: Gray-scale sonography with graded compression, as well as color Doppler and duplex ultrasound were performed to evaluate the lower extremity deep venous systems from the level of the common femoral vein and including the common femoral, femoral, profunda femoral, popliteal and calf veins including the posterior tibial, peroneal and gastrocnemius veins when visible. The superficial great saphenous vein was also interrogated. Spectral Doppler was utilized to evaluate flow at rest and with distal augmentation maneuvers in the common femoral, femoral and popliteal veins. COMPARISON:  Left lower extremity venous Doppler ultrasound-06/12/2020; 02/09/2020 (both were negative) FINDINGS: RIGHT LOWER EXTREMITY Common Femoral Vein: No evidence of thrombus. Normal compressibility, respiratory phasicity and response to augmentation. Saphenofemoral Junction: No evidence of thrombus. Normal compressibility and flow on color Doppler imaging. Profunda Femoral Vein: No evidence of thrombus. Normal compressibility and flow on color Doppler imaging. Femoral Vein: No evidence of thrombus. Normal compressibility, respiratory phasicity and response to augmentation.  Popliteal Vein: No evidence of thrombus. Normal compressibility, respiratory phasicity and response to augmentation. Calf Veins: No evidence of thrombus. Normal compressibility and flow on color Doppler imaging. Superficial Great Saphenous Vein: No evidence of thrombus. Normal compressibility. Venous Reflux:  None. Other Findings:  None. LEFT LOWER EXTREMITY Common Femoral Vein: No evidence of thrombus. Normal compressibility, respiratory phasicity and response to augmentation. Saphenofemoral Junction: No evidence of thrombus. Normal compressibility and flow on color Doppler imaging. Profunda Femoral Vein: No evidence of thrombus. Normal compressibility and flow on color Doppler imaging. Femoral Vein: No evidence of thrombus. Normal compressibility, respiratory phasicity and response to augmentation. Popliteal Vein: No evidence of thrombus. Normal compressibility, respiratory phasicity and response to augmentation. Calf Veins: No evidence of thrombus. Normal compressibility and flow on color Doppler imaging. Superficial Great  Saphenous Vein: No evidence of thrombus. Normal compressibility. Venous Reflux:  None. Other Findings:  None. IMPRESSION: No evidence of DVT within either lower extremity. Electronically Signed   By: Simonne Come M.D.   On: 05/12/2021 13:02   DG Chest Port 1 View  Result Date: 05/12/2021 CLINICAL DATA:  Hypertension, diabetes. EXAM: PORTABLE CHEST 1 VIEW COMPARISON:  11/05/2020, 03/15/2018 FINDINGS: Upper limits of normal heart size is likely accentuated by AP semi upright technique. Atherosclerotic calcification of the aortic knob. Mildly prominent interstitial markings throughout both lungs, increased from prior. Trace bilateral effusions are not excluded. No focal consolidation. No pneumothorax IMPRESSION: Mildly prominent interstitial markings throughout both lungs, increased from prior. Findings may reflect mild edema versus atypical/viral infection. Electronically Signed   By: Duanne Guess D.O.   On: 05/12/2021 12:06      Assessment/Plan Active Problems:   Weakness    Dizziness/weakness-  Vertigo?  Will check orthostatics Placed on meclizine home dose except instead of as needed and standing dose Telemetry to rule out any kind of dysrhythmia PT consulted   2.  Elevated troponin-mild elevation Likely demand ischemia EKG without any acute ST changes Cards was going to see pt for LE edema/elevated TP in am prior to dc  3.  Lower extremity edema Bnp nml Er gave lasix 40mg  iv x1 Ck echo Ck am labs  4.DM II Hold po meds. Med rec needs to be done by pharmacy Riss Ck FS  5. HTN- hold meds until med rec done Got lasix in er may drop bp   DVT prophylaxis: Lovenox  Code Status: DNR-verify Family Communication: Daughter at bedside Disposition Plan: DC back home Consults called: Cardiology Admission status: Observation as patient requires less than 2 midnight stays   MD [facility] Pager [phone]   If 7PM-7AM, please contact night-coverage [email or web address] Password [facility]  05/12/2021, 4:06 PM

## 2021-05-12 NOTE — ED Triage Notes (Signed)
Pt comes into the ED via EMS from home with c/o "feeling uneasy today" in route c/o leg swelling, pt has noted bed bugs on arrival  73HR 98%RA 158/92 [id number]

## 2021-05-13 DIAGNOSIS — F0390 Unspecified dementia without behavioral disturbance: Secondary | ICD-10-CM

## 2021-05-13 DIAGNOSIS — I639 Cerebral infarction, unspecified: Secondary | ICD-10-CM

## 2021-05-13 DIAGNOSIS — E785 Hyperlipidemia, unspecified: Secondary | ICD-10-CM | POA: Diagnosis not present

## 2021-05-13 DIAGNOSIS — R42 Dizziness and giddiness: Secondary | ICD-10-CM | POA: Diagnosis not present

## 2021-05-13 DIAGNOSIS — R600 Localized edema: Secondary | ICD-10-CM

## 2021-05-13 LAB — BASIC METABOLIC PANEL
Anion gap: 9 (ref 5–15)
BUN: 29 mg/dL — ABNORMAL HIGH (ref 8–23)
CO2: 28 mmol/L (ref 22–32)
Calcium: 9.9 mg/dL (ref 8.9–10.3)
Chloride: 102 mmol/L (ref 98–111)
Creatinine, Ser: 1.17 mg/dL — ABNORMAL HIGH (ref 0.44–1.00)
GFR, Estimated: 45 mL/min — ABNORMAL LOW (ref >60)
Glucose, Bld: 90 mg/dL (ref 70–99)
Potassium: 3.7 mmol/L (ref 3.5–5.1)
Sodium: 139 mmol/L (ref 135–145)

## 2021-05-13 LAB — ECHOCARDIOGRAM COMPLETE
Area-P 1/2: 2.39 cm2
Height: 60 in
S' Lateral: 2.73 cm
Weight: 2480 oz

## 2021-05-13 LAB — GLUCOSE, CAPILLARY
Glucose-Capillary: 158 mg/dL — ABNORMAL HIGH (ref 70–99)
Glucose-Capillary: 100 mg/dL — ABNORMAL HIGH (ref 70–99)

## 2021-05-13 NOTE — Discharge Summary (Signed)
Physician Discharge Summary  Connie Frederick ZOX:[id number] DOB: 15-Jan-1933 DOA: 05/12/2021  PCP: [name], MD  Admit date: 05/12/2021 Discharge date: 05/13/2021  Admitted From: home  Disposition:   home   Recommendations for Outpatient Follow-up and new medication changes:  Follow up with Dr. Allena Katz in 7 to 10 days.   I spoke over the phone with the patient's  about patient's daughter her condition, plan of care, prognosis and all questions were addressed.   Home Health: yes   Equipment/Devices: rolling walker    Discharge Condition: stable  CODE STATUS: DNR   Diet recommendation:  heart healthy   Brief/Interim Summary: Connie Frederick was admitted to the hospital with the working diagnosis of vertigo.   87 year old female past medical history for type 2 diabetes mellitus, hypertension, and vertigo who presented with dizziness, and acute weakness.  She also had lower extremity edema.  Denied any chest pain or shortness of breath.  On her initial physical examination blood pressure 168/66, heart rate 60, respiratory rate 18, temperature 98.5, oxygen saturation 97%.  Her lungs were clear to auscultation bilaterally, heart S1-S2, present, rhythmic, soft abdomen, no lower extremity edema, neurologically nonfocal.  Sodium 141, potassium 4.0, chloride 104, bicarb 28, glucose 91, BUN 25, creatinine 1.09, BNP 86, high sensitive troponin 24-30.  White cell count 4.6, hemoglobin 11.9, hematocrit 36.1, platelets 182. SARS COVID-19 negative.  Urinalysis specific gravity 1.011.  Negative nitrates.  Head CT negative for acute changes.  Old insult, infarct in the right cerebellar hemisphere. Chest radiograph with no infiltrates.  EKG 58 bpm, left axis deviation, left anterior fascicular block, sinus rhythm, no significant ST segment or T wave changes.  Vertigo.  Patient received supportive medical therapy, her symptoms resolved, she was seen by physical therapy, recommended home health services with  home physical therapy. No further changes were made to his medical regimen. Patient rule out for acute CVA.  2.  Hypertension.  Her blood pressure remained well controlled.  Further work-up with echocardiography showed normal LV systolic function. Heart failure and acute coronary syndrome have been ruled out.  Troponin elevation with no ACS pattern, possible demand ischemia Patient did receive 1 dose of furosemide.  At discharge resume losartan.   3.  Type 2 diabetes mellitus/ dyslipidemia.  Her glucose remained stable during her hospitalization. At discharge resume glipizide and rosuvastatin.   4. Dementia. Continue with donepezil.   5. Old CVA, right cerebellar hemisphere. Continue blood pressure control, aspirin and statin therapy.    Discharge Diagnoses:  Principal Problem:   Vertigo Active Problems:   Hyperlipidemia   Diabetes (HCC)   Weakness   CVA (cerebral vascular accident) (HCC)   Dementia (HCC)   Dyslipidemia    Discharge Instructions   Allergies as of 05/13/2021   No Known Allergies      Medication List     TAKE these medications    acetaminophen 650 MG CR tablet Commonly known as: TYLENOL Take 650 mg by mouth every 8 (eight) hours as needed for pain.   aspirin EC 81 MG tablet Take 81 mg by mouth daily.   Cholecalciferol 25 MCG (1000 UT) tablet Take 1,000 Units by mouth daily.   donepezil 5 MG tablet Commonly known as: ARICEPT donepezil 5 mg tablet   gabapentin 100 MG capsule Commonly known as: Neurontin Take 1 capsule (100 mg total) by mouth 3 (three) times daily as needed.   glipiZIDE 2.5 MG 24 hr tablet Commonly known as: GLUCOTROL XL Take 2.5 mg by  mouth daily.   losartan 100 MG tablet Commonly known as: COZAAR Take 100 mg by mouth daily.   meclizine 25 MG tablet Commonly known as: ANTIVERT Take 1 tablet (25 mg total) by mouth 3 (three) times daily as needed for dizziness.   meloxicam 7.5 MG tablet Commonly known as:  MOBIC meloxicam 7.5 mg tablet  Take 1 tablet every day by oral route.   ondansetron 4 MG disintegrating tablet Commonly known as: Zofran ODT Take 1 tablet (4 mg total) by mouth every 8 (eight) hours as needed for nausea or vomiting.   polyethylene glycol powder 17 GM/SCOOP powder Commonly known as: GLYCOLAX/MIRALAX Take 17 g by mouth daily as needed for mild constipation.   promethazine 12.5 MG tablet Commonly known as: PHENERGAN Take 1 tablet (12.5 mg total) by mouth every 8 (eight) hours as needed for nausea or vomiting.   rosuvastatin 5 MG tablet Commonly known as: CRESTOR Take 5 mg by mouth daily.   VITAMIN B 12 PO Take 1,000 mcg by mouth.               Durable Medical Equipment  (From admission, onward)           Start     Ordered   05/13/21 1348  For home use only DME Walker youth  Once       Question:  Patient needs a walker to treat with the following condition  Answer:  Weakness   05/13/21 1348            No Known Allergies    Procedures/Studies: CT HEAD WO CONTRAST ( )  Result Date: 05/12/2021 CLINICAL DATA:  Mental status change, unknown cause. EXAM: CT HEAD WITHOUT CONTRAST TECHNIQUE: Contiguous axial images were obtained from the base of the skull through the vertex without intravenous contrast. COMPARISON:  05/14/2018 FINDINGS: Brain: Chronic low-density in the white matter. Chronic focal area of low-density in the right cerebellar hemisphere compatible with old insult. No evidence for acute hemorrhage, mass lesion, midline shift, hydrocephalus or new large infarct. Vascular: No hyperdense vessel or unexpected calcification. Skull: Normal. Negative for fracture or focal lesion. Sinuses/Orbits: No acute finding. Other: None. IMPRESSION: 1. No acute intracranial abnormality. 2. Evidence for chronic small vessel ischemic changes. 3. 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 Electronically Signed   By: Richarda Overlie M.D.   On: 05/12/2021  12:47   US Venous Img Lower Bilateral  Result Date: 05/12/2021 CLINICAL DATA:  Bilateral lower extremity edema.  Evaluate for DVT. EXAM: BILATERAL LOWER EXTREMITY VENOUS DOPPLER ULTRASOUND TECHNIQUE: Gray-scale sonography with graded compression, as well as color Doppler and duplex ultrasound were performed to evaluate the lower extremity deep venous systems from the level of the common femoral vein and including the common femoral, femoral, profunda femoral, popliteal and calf veins including the posterior tibial, peroneal and gastrocnemius veins when visible. The superficial great saphenous vein was also interrogated. Spectral Doppler was utilized to evaluate flow at rest and with distal augmentation maneuvers in the common femoral, femoral and popliteal veins. COMPARISON:  Left lower extremity venous Doppler ultrasound-06/12/2020; 02/09/2020 (both were negative) FINDINGS: RIGHT LOWER EXTREMITY Common Femoral Vein: No evidence of thrombus. Normal compressibility, respiratory phasicity and response to augmentation. Saphenofemoral Junction: No evidence of thrombus. Normal compressibility and flow on color Doppler imaging. Profunda Femoral Vein: No evidence of thrombus. Normal compressibility and flow on color Doppler imaging. Femoral Vein: No evidence of thrombus. Normal compressibility, respiratory phasicity and response to augmentation. Popliteal Vein: No evidence of  thrombus. Normal compressibility, respiratory phasicity and response to augmentation. Calf Veins: No evidence of thrombus. Normal compressibility and flow on color Doppler imaging. Superficial Great Saphenous Vein: No evidence of thrombus. Normal compressibility. Venous Reflux:  None. Other Findings:  None. LEFT LOWER EXTREMITY Common Femoral Vein: No evidence of thrombus. Normal compressibility, respiratory phasicity and response to augmentation. Saphenofemoral Junction: No evidence of thrombus. Normal compressibility and flow on color Doppler  imaging. Profunda Femoral Vein: No evidence of thrombus. Normal compressibility and flow on color Doppler imaging. Femoral Vein: No evidence of thrombus. Normal compressibility, respiratory phasicity and response to augmentation. Popliteal Vein: No evidence of thrombus. Normal compressibility, respiratory phasicity and response to augmentation. Calf Veins: No evidence of thrombus. Normal compressibility and flow on color Doppler imaging. Superficial Great Saphenous Vein: No evidence of thrombus. Normal compressibility. Venous Reflux:  None. Other Findings:  None. IMPRESSION: No evidence of DVT within either lower extremity. Electronically Signed   By: Simonne Come M.D.   On: 05/12/2021 13:02   DG Chest Port 1 View  Result Date: 05/12/2021 CLINICAL DATA:  Hypertension, diabetes. EXAM: PORTABLE CHEST 1 VIEW COMPARISON:  11/05/2020, 03/15/2018 FINDINGS: Upper limits of normal heart size is likely accentuated by AP semi upright technique. Atherosclerotic calcification of the aortic knob. Mildly prominent interstitial markings throughout both lungs, increased from prior. Trace bilateral effusions are not excluded. No focal consolidation. No pneumothorax IMPRESSION: Mildly prominent interstitial markings throughout both lungs, increased from prior. Findings may reflect mild edema versus atypical/viral infection. Electronically Signed   By: Duanne Guess D.O.   On: 05/12/2021 12:06   ECHOCARDIOGRAM COMPLETE  Result Date: 05/13/2021    ECHOCARDIOGRAM REPORT   Patient Name:   PROMISS LABARBERA [facility] Date of Exam: 05/12/2021 Medical Rec #:  [id number]      Height:       60.0 in Accession #:    [id number]     Weight:       155.0 lb Date of Birth:  02-16-1933      BSA:          1.675 m Patient Age:    88 years       BP:           139/92 mmHg Patient Gender: F              HR:           64 bpm. Exam Location:  [facility] Procedure: 2D Echo, Cardiac Doppler and Color Doppler Indications:     I50.31 Acute Diastolic Heart Failure   History:         Patient has no prior history of Echocardiogram examinations.                  Risk Factors:Diabetes and Hypertension. Myocardial Infarction.                  Vertigo.  Sonographer:     Daphine Deutscher RDCS Referring Phys:  [id number] [facility] AMERY Diagnosing Phys: Debbe Odea MD IMPRESSIONS  1. Left ventricular ejection fraction, by estimation, is 60 to 65%. The left ventricle has normal function. The left ventricle has no regional wall motion abnormalities. There is mild left ventricular hypertrophy. Left ventricular diastolic parameters are consistent with Grade I diastolic dysfunction (impaired relaxation).  2. Right ventricular systolic function is normal. The right ventricular size is normal.  3. The mitral valve is normal in structure. Trivial mitral valve regurgitation.  4. The aortic valve is tricuspid. Aortic valve regurgitation is  not visualized.  5. The inferior vena cava is normal in size with greater than 50% respiratory variability, suggesting right atrial pressure of 3 mmHg. FINDINGS  Left Ventricle: Left ventricular ejection fraction, by estimation, is 60 to 65%. The left ventricle has normal function. The left ventricle has no regional wall motion abnormalities. The left ventricular internal cavity size was normal in size. There is  mild left ventricular hypertrophy. Left ventricular diastolic parameters are consistent with Grade I diastolic dysfunction (impaired relaxation). Right Ventricle: The right ventricular size is normal. No increase in right ventricular wall thickness. Right ventricular systolic function is normal. Left Atrium: Left atrial size was normal in size. Right Atrium: Right atrial size was normal in size. Pericardium: There is no evidence of pericardial effusion. Mitral Valve: The mitral valve is normal in structure. Trivial mitral valve regurgitation. Tricuspid Valve: The tricuspid valve is normal in structure. Tricuspid valve regurgitation is mild.  Aortic Valve: The aortic valve is tricuspid. Aortic valve regurgitation is not visualized. Pulmonic Valve: The pulmonic valve was not well visualized. Pulmonic valve regurgitation is not visualized. Aorta: The aortic root and ascending aorta are structurally normal, with no evidence of dilitation. Venous: The inferior vena cava is normal in size with greater than 50% respiratory variability, suggesting right atrial pressure of 3 mmHg. IAS/Shunts: No atrial level shunt detected by color flow Doppler.  LEFT VENTRICLE PLAX 2D LVIDd:         3.92 cm  Diastology LVIDs:         2.73 cm  LV e' medial:    4.24 cm/s LV PW:         1.15 cm  LV E/e' medial:  14.2 LV IVS:        1.16 cm  LV e' lateral:   6.42 cm/s LVOT diam:     1.80 cm  LV E/e' lateral: 9.3 LV SV:         41 LV SV Index:   24 LVOT Area:     2.54 cm  RIGHT VENTRICLE RV Basal diam:  2.53 cm RV S prime:     10.40 cm/s TAPSE (M-mode): 2.5 cm LEFT ATRIUM             Index       RIGHT ATRIUM          Index LA diam:        4.00 cm 2.39 cm/m  RA Area:     9.72 cm LA Vol (A2C):   37.4 ml 22.33 ml/m RA Volume:   23.40 ml 13.97 ml/m LA Vol (A4C):   25.9 ml 15.46 ml/m LA Biplane Vol: 32.2 ml 19.22 ml/m  AORTIC VALVE LVOT Vmax:   87.20 cm/s LVOT Vmean:  56.600 cm/s LVOT VTI:    0.161 m  AORTA Ao Root diam: 3.40 cm Ao Asc diam:  3.30 cm MITRAL VALVE               TRICUSPID VALVE MV Area (PHT): 2.39 cm    TR Peak grad:   16.6 mmHg MV Decel Time: 317 msec    TR Vmax:        204.00 cm/s MV E velocity: 60.00 cm/s MV A velocity: 81.00 cm/s  SHUNTS MV E/A ratio:  0.74        Systemic VTI:  0.16 m                            Systemic  Diam: 1.80 cm Debbe Odea MD Electronically signed by Debbe Odea MD Signature Date/Time: 05/13/2021/1:35:57 PM    Final        Subjective: Patient is feeling better, no nausea or vomiting, no dizziness or weakness, she ambulated with pt.    Discharge Exam: Vitals:   05/13/21 0557 05/13/21 0815  BP: 110/83 (!) 122/59   Pulse: 70 61  Resp: 18 18  Temp: 98.6 F (37 C) 98 F (36.7 C)  SpO2: 100% 100%   Vitals:   05/12/21 2337 05/12/21 2344 05/13/21 0557 05/13/21 0815  BP: 132/61 (!) 84/73 110/83 (!) 122/59  Pulse: 64 86 70 61  Resp:   18 18  Temp:   98.6 F (37 C) 98 F (36.7 C)  TempSrc:   Oral   SpO2: 99% 99% 100% 100%  Weight:      Height:        General: Not in pain or dyspnea.  Neurology: Awake and alert, non focal  E ENT: no pallor, no icterus, oral mucosa moist Cardiovascular: No JVD. S1-S2 present, rhythmic, no gallops, rubs, or murmurs. No lower extremity edema. Pulmonary:positive breath sounds bilaterally,with no wheezing, rhonchi or rales. Gastrointestinal. Abdomen soft and non tender Skin. No rashes Musculoskeletal: no joint deformities   The results of significant diagnostics from this hospitalization (including imaging, microbiology, ancillary and laboratory) are listed below for reference.     Microbiology: Recent Results (from the past 240 hour(s))  Resp Panel by RT-PCR (Flu A&B, Covid) Nasopharyngeal Swab     Status: None   Collection Time: 05/12/21  3:51 PM   Specimen: Nasopharyngeal Swab; Nasopharyngeal(NP) swabs in vial transport medium  Result Value Ref Range Status   SARS Coronavirus 2 by RT PCR NEGATIVE NEGATIVE Final    Comment: (NOTE) SARS-CoV-2 target nucleic acids are NOT DETECTED.  The SARS-CoV-2 RNA is generally detectable in upper respiratory specimens during the acute phase of infection. The lowest concentration of SARS-CoV-2 viral copies this assay can detect is 138 copies/mL. A negative result does not preclude SARS-Cov-2 infection and should not be used as the sole basis for treatment or other patient management decisions. A negative result may occur with  improper specimen collection/handling, submission of specimen other than nasopharyngeal swab, presence of viral mutation(s) within the areas targeted by this assay, and inadequate number of  viral copies(<138 copies/mL). A negative result must be combined with clinical observations, patient history, and epidemiological information. The expected result is Negative.  Fact Sheet for Patients:  BloggerCourse.com  Fact Sheet for Healthcare Providers:  SeriousBroker.it  This test is no t yet approved or cleared by the [place] FDA and  has been authorized for detection and/or diagnosis of SARS-CoV-2 by FDA under an Emergency Use Authorization (EUA). This EUA will remain  in effect (meaning this test can be used) for the duration of the COVID-19 declaration under Section 564(b)(1) of the Act, 21 U.S.C.section 360bbb-3(b)(1), unless the authorization is terminated  or revoked sooner.       Influenza A by PCR NEGATIVE NEGATIVE Final   Influenza B by PCR NEGATIVE NEGATIVE Final    Comment: (NOTE) The Xpert Xpress SARS-CoV-2/FLU/RSV plus assay is intended as an aid in the diagnosis of influenza from Nasopharyngeal swab specimens and should not be used as a sole basis for treatment. Nasal washings and aspirates are unacceptable for Xpert Xpress SARS-CoV-2/FLU/RSV testing.  Fact Sheet for Patients: BloggerCourse.com  Fact Sheet for Healthcare Providers: SeriousBroker.it  This test is not yet approved or  cleared by the [place] and has been authorized for detection and/or diagnosis of SARS-CoV-2 by FDA under an Emergency Use Authorization (EUA). This EUA will remain in effect (meaning this test can be used) for the duration of the COVID-19 declaration under Section 564(b)(1) of the Act, 21 U.S.C. section 360bbb-3(b)(1), unless the authorization is terminated or revoked.  Performed at [facility], [address] [place], [place] [address]      Labs: BNP (last 3 results) Recent Labs    06/12/20 1428 05/12/21 1000  BNP 134.1* 86.5   Basic  Metabolic Panel: Recent Labs  Lab 05/12/21 1000 05/13/21 0517  NA 141 139  K 4.0 3.7  CL 104 102  CO2 28 28  GLUCOSE 91 90  BUN 25* 29*  CREATININE 1.06* 1.17*  CALCIUM 9.7 9.9   Liver Function Tests: No results for input(s): AST, ALT, ALKPHOS, BILITOT, PROT, ALBUMIN in the last 168 hours. No results for input(s): LIPASE, AMYLASE in the last 168 hours. No results for input(s): AMMONIA in the last 168 hours. CBC: Recent Labs  Lab 05/12/21 1000  WBC 4.6  HGB 11.9*  HCT 36.1  MCV 85.7  PLT 182   Cardiac Enzymes: No results for input(s): CKTOTAL, CKMB, CKMBINDEX, TROPONINI in the last 168 hours. BNP: Invalid input(s): POCBNP CBG: Recent Labs  Lab 05/12/21 1145 05/12/21 1900 05/12/21 2320 05/13/21 0949 05/13/21 1146  GLUCAP 81 115* 109* 158* 100*   D-Dimer No results for input(s): DDIMER in the last 72 hours. Hgb A1c Recent Labs    05/12/21 1856  HGBA1C 6.1*   Lipid Profile No results for input(s): CHOL, HDL, LDLCALC, TRIG, CHOLHDL, LDLDIRECT in the last 72 hours. Thyroid function studies No results for input(s): TSH, T4TOTAL, T3FREE, THYROIDAB in the last 72 hours.  Invalid input(s): FREET3 Anemia work up No results for input(s): VITAMINB12, FOLATE, FERRITIN, TIBC, IRON, RETICCTPCT in the last 72 hours. Urinalysis    Component Value Date/Time   COLORURINE STRAW (A) 05/12/2021 1154   APPEARANCEUR CLEAR (A) 05/12/2021 1154   APPEARANCEUR Clear 12/31/2013 0121   LABSPEC 1.011 05/12/2021 1154   LABSPEC 1.010 12/31/2013 0121   PHURINE 5.0 05/12/2021 1154   GLUCOSEU NEGATIVE 05/12/2021 1154   GLUCOSEU Negative 12/31/2013 0121   HGBUR NEGATIVE 05/12/2021 1154   BILIRUBINUR NEGATIVE 05/12/2021 1154   BILIRUBINUR Negative 12/31/2013 0121   KETONESUR NEGATIVE 05/12/2021 1154   PROTEINUR NEGATIVE 05/12/2021 1154   NITRITE NEGATIVE 05/12/2021 1154   LEUKOCYTESUR NEGATIVE 05/12/2021 1154   LEUKOCYTESUR 1+ 12/31/2013 0121   Sepsis Labs Invalid input(s):  PROCALCITONIN,  WBC,  LACTICIDVEN Microbiology Recent Results (from the past 240 hour(s))  Resp Panel by RT-PCR (Flu A&B, Covid) Nasopharyngeal Swab     Status: None   Collection Time: 05/12/21  3:51 PM   Specimen: Nasopharyngeal Swab; Nasopharyngeal(NP) swabs in vial transport medium  Result Value Ref Range Status   SARS Coronavirus 2 by RT PCR NEGATIVE NEGATIVE Final    Comment: (NOTE) SARS-CoV-2 target nucleic acids are NOT DETECTED.  The SARS-CoV-2 RNA is generally detectable in upper respiratory specimens during the acute phase of infection. The lowest concentration of SARS-CoV-2 viral copies this assay can detect is 138 copies/mL. A negative result does not preclude SARS-Cov-2 infection and should not be used as the sole basis for treatment or other patient management decisions. A negative result may occur with  improper specimen collection/handling, submission of specimen other than nasopharyngeal swab, presence of viral mutation(s) within the areas targeted by  this assay, and inadequate number of viral copies(<138 copies/mL). A negative result must be combined with clinical observations, patient history, and epidemiological information. The expected result is Negative.  Fact Sheet for Patients:  BloggerCourse.comhttps://www.fda.gov/media/152166/download  Fact Sheet for Healthcare Providers:  SeriousBroker.ithttps://www.fda.gov/media/152162/download  This test is no t yet approved or cleared by the [place] FDA and  has been authorized for detection and/or diagnosis of SARS-CoV-2 by FDA under an Emergency Use Authorization (EUA). This EUA will remain  in effect (meaning this test can be used) for the duration of the COVID-19 declaration under Section 564(b)(1) of the Act, 21 U.S.C.section 360bbb-3(b)(1), unless the authorization is terminated  or revoked sooner.       Influenza A by PCR NEGATIVE NEGATIVE Final   Influenza B by PCR NEGATIVE NEGATIVE Final    Comment: (NOTE) The Xpert Xpress  SARS-CoV-2/FLU/RSV plus assay is intended as an aid in the diagnosis of influenza from Nasopharyngeal swab specimens and should not be used as a sole basis for treatment. Nasal washings and aspirates are unacceptable for Xpert Xpress SARS-CoV-2/FLU/RSV testing.  Fact Sheet for Patients: BloggerCourse.comhttps://www.fda.gov/media/152166/download  Fact Sheet for Healthcare Providers: SeriousBroker.ithttps://www.fda.gov/media/152162/download  This test is not yet approved or cleared by the [place] FDA and has been authorized for detection and/or diagnosis of SARS-CoV-2 by FDA under an Emergency Use Authorization (EUA). This EUA will remain in effect (meaning this test can be used) for the duration of the COVID-19 declaration under Section 564(b)(1) of the Act, 21 U.S.C. section 360bbb-3(b)(1), unless the authorization is terminated or revoked.  Performed at [facility], [address] [place], [place] [id number]      Time coordinating discharge: 45 minutes  SIGNED:   Coralie KeensMauricio Daniel Tambria Pfannenstiel, MD  [facility] 05/13/2021, 1:47 PM

## 2021-05-13 NOTE — Care Management Obs Status (Signed)
MEDICARE OBSERVATION STATUS NOTIFICATION   Patient Details  Name: Connie Frederick MRN: [id number] Date of Birth: 03-31-1933   Medicare Observation Status Notification Given:  Yes    Chapman Fitch, RN 05/13/2021, 1:40 PM

## 2021-05-13 NOTE — Consult Note (Signed)
Cardiology Consultation:   Patient ID: Connie Frederick MRN: [id number]; DOB: Jul 30, 1933  Admit date: 05/12/2021 Date of Consult: 05/13/2021  PCP:  Hillery AldoPatel, Sarah, MD   [facility] Providers Cardiologist:  None        Patient Profile:   Connie Frederick is a 87 y.o. female with a hx of hypertension, diabetes, vertigo who is being seen 05/13/2021 for the evaluation of edema at the request of Dr. Marylu LundAmery.  History of Present Illness:   Connie Frederick is an 87 year old female with history of hypertension, diabetes, vertigo presenting with dizziness, weakness.  Patient states she was at home when she is felt extremely dizzy and weak.  Denies chest pain or shortness of breath.  States her legs were a little bit more swollen than usual.  Has no other concerns on history of heart disease at this time.  In the ED upon admission, chest x-ray showed some pulmonary congestion, was given IV Lasix 40 mg x 1.  Meclizine was also started for dizziness.  She states feeling much better upon my exam.  Will like to go home.  EKG showed sinus bradycardia.  Troponins were 24, 30.  BNP was normal at 86.5.   Past Medical History:  Diagnosis Date   Diabetes mellitus without complication (HCC)    Hypertension    Myocardial infarction (HCC)    Vertigo    HOSPITALIZED 2 WEEKS AGO    Past Surgical History:  Procedure Laterality Date   ABDOMINAL HYSTERECTOMY     CATARACT EXTRACTION W/PHACO Left 06/08/2018   Procedure: CATARACT EXTRACTION PHACO AND INTRAOCULAR LENS PLACEMENT (IOC);  Surgeon: Lockie MolaBrasington, Chadwick, MD;  Location: [facility] ORS;  Service: Ophthalmology;  Laterality: Left;  US 04:54 AP% 14.4 CDE 13.2 Fluid pack lot # [id number]   CATARACT EXTRACTION W/PHACO Right 06/28/2018   Procedure: CATARACT EXTRACTION PHACO AND INTRAOCULAR LENS PLACEMENT (IOC) RIGHT DIABETIC;  Surgeon: Lockie MolaBrasington, Chadwick, MD;  [facility];  Service: Ophthalmology;  Laterality: Right;  diabetic - oral meds   VEIN  LIGATION       Home Medications:  Prior to Admission medications   Medication Sig Start Date End Date Taking? Authorizing Provider  acetaminophen (TYLENOL) 650 MG CR tablet Take 650 mg by mouth every 8 (eight) hours as needed for pain.    [provider]  aspirin EC 81 MG tablet Take 81 mg by mouth daily.    [provider]  Cholecalciferol 25 MCG (1000 UT) tablet Take 1,000 Units by mouth daily.    [provider]  Cyanocobalamin (VITAMIN B 12 PO) Take 1,000 mcg by mouth.    [provider]  donepezil (ARICEPT) 5 MG tablet donepezil 5 mg tablet    [provider]  gabapentin (NEURONTIN) 100 MG capsule Take 1 capsule (100 mg total) by mouth 3 (three) times daily as needed. 08/11/19 08/10/20  Sharyn CreamerQuale, Mark, MD  glipiZIDE (GLUCOTROL XL) 2.5 MG 24 hr tablet Take 2.5 mg by mouth daily. 08/25/15   [provider]  losartan (COZAAR) 100 MG tablet Take 100 mg by mouth daily. 08/11/15   [provider]  meclizine (ANTIVERT) 25 MG tablet Take 1 tablet (25 mg total) by mouth 3 (three) times daily as needed for dizziness. 03/27/19   Willy Eddyobinson, Patrick, MD  meloxicam (MOBIC) 7.5 MG tablet meloxicam 7.5 mg tablet  Take 1 tablet every day by oral route.    [provider]  ondansetron (ZOFRAN ODT) 4 MG disintegrating tablet Take 1 tablet (4 mg  total) by mouth every 8 (eight) hours as needed for nausea or vomiting. 08/12/18   Arnaldo Natal, MD  polyethylene glycol powder (GLYCOLAX/MIRALAX) powder Take 17 g by mouth daily as needed for mild constipation.  10/14/15   [provider]  promethazine (PHENERGAN) 12.5 MG tablet Take 1 tablet (12.5 mg total) by mouth every 8 (eight) hours as needed for nausea or vomiting. 05/15/18   Enid Baas, MD  rosuvastatin (CRESTOR) 5 MG tablet Take 5 mg by mouth daily.    [provider]    Inpatient Medications: Scheduled Meds:  aspirin EC  81 mg Oral Daily   enoxaparin (LOVENOX)  injection  40 mg Subcutaneous Q24H   insulin aspart  0-5 Units Subcutaneous QHS   insulin aspart  0-9 Units Subcutaneous TID WC   meclizine  25 mg Oral TID   rosuvastatin  5 mg Oral Daily   Continuous Infusions:  PRN Meds: acetaminophen, ondansetron, polyethylene glycol  Allergies:   No Known Allergies  Social History:   Social History   Socioeconomic History   Marital status: Widowed    Spouse name: Not on file   Number of children: Not on file   Years of education: Not on file   Highest education level: Not on file  Occupational History   Not on file  Tobacco Use   Smoking status: Never   Smokeless tobacco: Never  Vaping Use   Vaping Use: Never used  Substance and Sexual Activity   Alcohol use: No   Drug use: No   Sexual activity: Not on file  Other Topics Concern   Not on file  Social History Narrative   Not on file   Social Determinants of Health   Financial Resource Strain: Not on file  Food Insecurity: Not on file  Transportation Needs: Not on file  Physical Activity: Not on file  Stress: Not on file  Social Connections: Not on file  Intimate Partner Violence: Not on file    Family History:    Family History  Problem Relation Age of Onset   Hypertension Brother      ROS:  Please see the history of present illness.   All other ROS reviewed and negative.     Physical Exam/Data:   Vitals:   05/12/21 2337 05/12/21 2344 05/13/21 0557 05/13/21 0815  BP: 132/61 (!) 84/73 110/83 (!) 122/59  Pulse: 64 86 70 61  Resp:   18 18  Temp:   98.6 F (37 C) 98 F (36.7 C)  TempSrc:   Oral   SpO2: 99% 99% 100% 100%  Weight:      Height:        Intake/Output Summary (Last 24 hours) at 05/13/2021 1534 Last data filed at 05/13/2021 0021 Gross per 24 hour  Intake 0 ml  Output 400 ml  Net -400 ml   Last 3 Weights 05/12/2021 06/12/2020 02/09/2020  Weight (lbs) 155 lb 169 lb 12.1 oz 170 lb  Weight (kg) 70.308 kg 77 kg 77.111 kg     Body mass index is  30.27 kg/m.  General:  Well nourished, well developed, in no acute distress HEENT: normal Lymph: no adenopathy Neck: no JVD Endocrine:  No thryomegaly Vascular: No carotid bruits; FA pulses 2+ bilaterally without bruits  Cardiac:  normal S1, S2; RRR; no murmur  Lungs:  clear to auscultation bilaterally, no wheezing, rhonchi or rales  Abd: soft, nontender, no hepatomegaly  Ext: no edema Musculoskeletal:  No deformities, BUE and BLE strength  normal and equal Skin: warm and dry  Neuro:  CNs 2-12 intact, no focal abnormalities noted Psych:  Normal affect   EKG:  The EKG was personally reviewed and demonstrates: Sinus bradycardia Telemetry:  Telemetry was personally reviewed and demonstrates: Sinus rhythm  Relevant CV Studies: Echo 05/14/2021 1. Left ventricular ejection fraction, by estimation, is 60 to 65%. The  left ventricle has normal function. The left ventricle has no regional  wall motion abnormalities. There is mild left ventricular hypertrophy.  Left ventricular diastolic parameters  are consistent with Grade I diastolic dysfunction (impaired relaxation).   2. Right ventricular systolic function is normal. The right ventricular  size is normal.   3. The mitral valve is normal in structure. Trivial mitral valve  regurgitation.   4. The aortic valve is tricuspid. Aortic valve regurgitation is not  visualized.   5. The inferior vena cava is normal in size with greater than 50%  respiratory variability, suggesting right atrial pressure of 3 mmHg.  Laboratory Data:  High Sensitivity Troponin:   Recent Labs  Lab 05/12/21 1000 05/12/21 1415  TROPONINIHS 24* 30*     Chemistry Recent Labs  Lab 05/12/21 1000 05/13/21 0517  NA 141 139  K 4.0 3.7  CL 104 102  CO2 28 28  GLUCOSE 91 90  BUN 25* 29*  CREATININE 1.06* 1.17*  CALCIUM 9.7 9.9  GFRNONAA 51* 45*  ANIONGAP 9 9    No results for input(s): PROT, ALBUMIN, AST, ALT, ALKPHOS, BILITOT in the last 168  hours. Hematology Recent Labs  Lab 05/12/21 1000  WBC 4.6  RBC 4.21  HGB 11.9*  HCT 36.1  MCV 85.7  MCH 28.3  MCHC 33.0  RDW 13.5  PLT 182   BNP Recent Labs  Lab 05/12/21 1000  BNP 86.5    DDimer No results for input(s): DDIMER in the last 168 hours.   Radiology/Studies:  CT HEAD WO CONTRAST ( )  Result Date: 05/12/2021 CLINICAL DATA:  Mental status change, unknown cause. EXAM: CT HEAD WITHOUT CONTRAST TECHNIQUE: Contiguous axial images were obtained from the base of the skull through the vertex without intravenous contrast. COMPARISON:  05/14/2018 FINDINGS: Brain: Chronic low-density in the white matter. Chronic focal area of low-density in the right cerebellar hemisphere compatible with old insult. No evidence for acute hemorrhage, mass lesion, midline shift, hydrocephalus or new large infarct. Vascular: No hyperdense vessel or unexpected calcification. Skull: Normal. Negative for fracture or focal lesion. Sinuses/Orbits: No acute finding. Other: None. IMPRESSION: 1. No acute intracranial abnormality. 2. Evidence for chronic small vessel ischemic changes. 3. 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 Electronically Signed   By: Richarda Overlie M.D.   On: 05/12/2021 12:47   US Venous Img Lower Bilateral  Result Date: 05/12/2021 CLINICAL DATA:  Bilateral lower extremity edema.  Evaluate for DVT. EXAM: BILATERAL LOWER EXTREMITY VENOUS DOPPLER ULTRASOUND TECHNIQUE: Gray-scale sonography with graded compression, as well as color Doppler and duplex ultrasound were performed to evaluate the lower extremity deep venous systems from the level of the common femoral vein and including the common femoral, femoral, profunda femoral, popliteal and calf veins including the posterior tibial, peroneal and gastrocnemius veins when visible. The superficial great saphenous vein was also interrogated. Spectral Doppler was utilized to evaluate flow at rest and with distal augmentation maneuvers  in the common femoral, femoral and popliteal veins. COMPARISON:  Left lower extremity venous Doppler ultrasound-06/12/2020; 02/09/2020 (both were negative) FINDINGS: RIGHT LOWER EXTREMITY Common Femoral Vein: No evidence of thrombus.  Normal compressibility, respiratory phasicity and response to augmentation. Saphenofemoral Junction: No evidence of thrombus. Normal compressibility and flow on color Doppler imaging. Profunda Femoral Vein: No evidence of thrombus. Normal compressibility and flow on color Doppler imaging. Femoral Vein: No evidence of thrombus. Normal compressibility, respiratory phasicity and response to augmentation. Popliteal Vein: No evidence of thrombus. Normal compressibility, respiratory phasicity and response to augmentation. Calf Veins: No evidence of thrombus. Normal compressibility and flow on color Doppler imaging. Superficial Great Saphenous Vein: No evidence of thrombus. Normal compressibility. Venous Reflux:  None. Other Findings:  None. LEFT LOWER EXTREMITY Common Femoral Vein: No evidence of thrombus. Normal compressibility, respiratory phasicity and response to augmentation. Saphenofemoral Junction: No evidence of thrombus. Normal compressibility and flow on color Doppler imaging. Profunda Femoral Vein: No evidence of thrombus. Normal compressibility and flow on color Doppler imaging. Femoral Vein: No evidence of thrombus. Normal compressibility, respiratory phasicity and response to augmentation. Popliteal Vein: No evidence of thrombus. Normal compressibility, respiratory phasicity and response to augmentation. Calf Veins: No evidence of thrombus. Normal compressibility and flow on color Doppler imaging. Superficial Great Saphenous Vein: No evidence of thrombus. Normal compressibility. Venous Reflux:  None. Other Findings:  None. IMPRESSION: No evidence of DVT within either lower extremity. Electronically Signed   By: Simonne Come M.D.   On: 05/12/2021 13:02   DG Chest Port 1  View  Result Date: 05/12/2021 CLINICAL DATA:  Hypertension, diabetes. EXAM: PORTABLE CHEST 1 VIEW COMPARISON:  11/05/2020, 03/15/2018 FINDINGS: Upper limits of normal heart size is likely accentuated by AP semi upright technique. Atherosclerotic calcification of the aortic knob. Mildly prominent interstitial markings throughout both lungs, increased from prior. Trace bilateral effusions are not excluded. No focal consolidation. No pneumothorax IMPRESSION: Mildly prominent interstitial markings throughout both lungs, increased from prior. Findings may reflect mild edema versus atypical/viral infection. Electronically Signed   By: Duanne Guess D.O.   On: 05/12/2021 12:06   ECHOCARDIOGRAM COMPLETE  Result Date: 05/13/2021    ECHOCARDIOGRAM REPORT   Patient Name:   Connie Frederick [facility] Date of Exam: 05/12/2021 Medical Rec #:  [id number]      Height:       60.0 in Accession #:    [id number]     Weight:       155.0 lb Date of Birth:  December 15, 1932      BSA:          1.675 m Patient Age:    88 years       BP:           139/92 mmHg Patient Gender: F              HR:           64 bpm. Exam Location:  [facility] Procedure: 2D Echo, Cardiac Doppler and Color Doppler Indications:     I50.31 Acute Diastolic Heart Failure  History:         Patient has no prior history of Echocardiogram examinations.                  Risk Factors:Diabetes and Hypertension. Myocardial Infarction.                  Vertigo.  Sonographer:     Daphine Deutscher RDCS Referring Phys:  [id number] [facility] AMERY Diagnosing Phys: Debbe Odea MD IMPRESSIONS  1. Left ventricular ejection fraction, by estimation, is 60 to 65%. The left ventricle has normal function. The left ventricle has no regional wall motion abnormalities. There is  mild left ventricular hypertrophy. Left ventricular diastolic parameters are consistent with Grade I diastolic dysfunction (impaired relaxation).  2. Right ventricular systolic function is normal. The right ventricular size is  normal.  3. The mitral valve is normal in structure. Trivial mitral valve regurgitation.  4. The aortic valve is tricuspid. Aortic valve regurgitation is not visualized.  5. The inferior vena cava is normal in size with greater than 50% respiratory variability, suggesting right atrial pressure of 3 mmHg. FINDINGS  Left Ventricle: Left ventricular ejection fraction, by estimation, is 60 to 65%. The left ventricle has normal function. The left ventricle has no regional wall motion abnormalities. The left ventricular internal cavity size was normal in size. There is  mild left ventricular hypertrophy. Left ventricular diastolic parameters are consistent with Grade I diastolic dysfunction (impaired relaxation). Right Ventricle: The right ventricular size is normal. No increase in right ventricular wall thickness. Right ventricular systolic function is normal. Left Atrium: Left atrial size was normal in size. Right Atrium: Right atrial size was normal in size. Pericardium: There is no evidence of pericardial effusion. Mitral Valve: The mitral valve is normal in structure. Trivial mitral valve regurgitation. Tricuspid Valve: The tricuspid valve is normal in structure. Tricuspid valve regurgitation is mild. Aortic Valve: The aortic valve is tricuspid. Aortic valve regurgitation is not visualized. Pulmonic Valve: The pulmonic valve was not well visualized. Pulmonic valve regurgitation is not visualized. Aorta: The aortic root and ascending aorta are structurally normal, with no evidence of dilitation. Venous: The inferior vena cava is normal in size with greater than 50% respiratory variability, suggesting right atrial pressure of 3 mmHg. IAS/Shunts: No atrial level shunt detected by color flow Doppler.  LEFT VENTRICLE PLAX 2D LVIDd:         3.92 cm  Diastology LVIDs:         2.73 cm  LV e' medial:    4.24 cm/s LV PW:         1.15 cm  LV E/e' medial:  14.2 LV IVS:        1.16 cm  LV e' lateral:   6.42 cm/s LVOT diam:      1.80 cm  LV E/e' lateral: 9.3 LV SV:         41 LV SV Index:   24 LVOT Area:     2.54 cm  RIGHT VENTRICLE RV Basal diam:  2.53 cm RV S prime:     10.40 cm/s TAPSE (M-mode): 2.5 cm LEFT ATRIUM             Index       RIGHT ATRIUM          Index LA diam:        4.00 cm 2.39 cm/m  RA Area:     9.72 cm LA Vol (A2C):   37.4 ml 22.33 ml/m RA Volume:   23.40 ml 13.97 ml/m LA Vol (A4C):   25.9 ml 15.46 ml/m LA Biplane Vol: 32.2 ml 19.22 ml/m  AORTIC VALVE LVOT Vmax:   87.20 cm/s LVOT Vmean:  56.600 cm/s LVOT VTI:    0.161 m  AORTA Ao Root diam: 3.40 cm Ao Asc diam:  3.30 cm MITRAL VALVE               TRICUSPID VALVE MV Area (PHT): 2.39 cm    TR Peak grad:   16.6 mmHg MV Decel Time: 317 msec    TR Vmax:        204.00 cm/s MV  E velocity: 60.00 cm/s MV A velocity: 81.00 cm/s  SHUNTS MV E/A ratio:  0.74        Systemic VTI:  0.16 m                            Systemic Diam: 1.80 cm Debbe Odea MD Electronically signed by Debbe Odea MD Signature Date/Time: 05/13/2021/1:35:57 PM    Final      Assessment and Plan:   Leg edema -Echo with preserved EF, impaired relaxation, BNP normal -Etiology not likely cardiac -Okay to prescribe patient Lasix 20 mg daily as needed upon discharge. -Denies chest pain or shortness of breath, no additional cardiac testing indicated  2.  Dizziness, history of vertigo -Management as per primary team.  Antivert was prescribed   Patient can be discharged from a cardiac perspective, no additional cardiac testing is planned.    Signed, Debbe Odea, MD  05/13/2021 3:34 PM

## 2021-05-13 NOTE — TOC Initial Note (Signed)
Transition of Care [facility]) - Initial/Assessment Note    Patient Details  Name: Connie Frederick MRN: [id number] Date of Birth: 11/29/1932  Transition of Care [facility]) CM/SW Contact:    Chapman Fitch, RN Phone Number: 05/13/2021, 1:43 PM  Clinical Narrative:                  Patient admitted from home with weakness Patient lives at home alone   PCP Allena Katz - brother transport to appointments [facility]  Patient states she has a standard walker, and cane in the home  PT has assessed patient and recommends home health. Patient agreeable and states she does not have a preference of home health agency.  Referral made to [facility] with [facility]   Referral made to [facility] with [facility] for youth walker   Expected Discharge Plan: Home w Home Health Services Barriers to Discharge: Continued Medical Work up   Patient Goals and CMS Choice        Expected Discharge Plan and Services Expected Discharge Plan: Home w Home Health Services                         DME Arranged: Dan Humphreys youth DME Agency: [facility] Date DME Agency Contacted: 05/13/21   Representative spoke with at DME Agency: zack HH Arranged: PT, OT HH Agency: [facility] Date HH Agency Contacted: 05/13/21   Representative spoke with at [facility] Agency: Barbara Cower  Prior Living Arrangements/Services   Lives with:: Self Patient language and need for interpreter reviewed:: Yes Do you feel safe going back to the place where you live?: Yes      Need for Family Participation in Patient Care: Yes (Comment)     Criminal Activity/Legal Involvement Pertinent to Current Situation/Hospitalization: No - Comment as needed  Activities of Daily Living Home Assistive Devices/Equipment: Cane (specify quad or straight) ADL Screening (condition at time of admission) Patient's cognitive ability adequate to safely complete daily activities?: Yes Is the patient deaf or have difficulty hearing?: No Does the  patient have difficulty seeing, even when wearing glasses/contacts?: No Does the patient have difficulty concentrating, remembering, or making decisions?: No Patient able to express need for assistance with ADLs?: No Does the patient have difficulty dressing or bathing?: Yes Independently performs ADLs?: Yes (appropriate for developmental age) Does the patient have difficulty walking or climbing stairs?: No Weakness of Legs: Both Weakness of Arms/Hands: None  Permission Sought/Granted                  Emotional Assessment       Orientation: : Oriented to Self, Oriented to Place, Oriented to  Time, Oriented to Situation Alcohol / Substance Use: Not Applicable Psych Involvement: No (comment)  Admission diagnosis:  Lower extremity edema [R60.0] Weakness [R53.1] Patient Active Problem List   Diagnosis Date Noted   Weakness 05/12/2021   Vertigo 05/14/2018   Varicose veins of leg with pain, bilateral 03/29/2017   Swelling of limb 03/29/2017   Hyperlipidemia 02/22/2017   Diabetes (HCC) 02/22/2017   Pain and swelling of lower extremity 02/22/2017   PCP:  [name], MD Pharmacy:   [facility] - [place], [place] - [address] [place] [address] [place] [address] Phone: [phone] Fax: [phone]     Social Determinants of Health (SDOH) Interventions    Readmission Risk Interventions No flowsheet data found.

## 2021-05-13 NOTE — Evaluation (Signed)
Physical Therapy Evaluation Patient Details Name: Connie Frederick MRN: [id number] DOB: [birth year] Today's Date: 05/13/2021   History of Present Illness  Pt is an 87 y.o. female presenting to hospital 8/23 with generalized weakness and dizziness when she woke up; also worsening swelling in LE's that is new.  H/o vertigo but slightly different.  Pt now admitted with dizziness/weakness (?vertigo), elevated troponin (mild)--likely demand ischemia, and LE edema.  PMH includes DM, htn, MI, vertigo, and vein ligation.  Clinical Impression  Prior to hospital admission, pt was modified independent with ambulation using SPC; lives alone in 1 level home (2 steps B railings down to den).  Currently pt is modified independent with bed mobility; SBA with transfers; and CGA with ambulation (x30 feet with SPC and pt holding onto IV pole; 60 feet x2 with pt using RW).  Pt appearing and reporting feeling steady and safer ambulating with RW.  Pt reporting no dizziness or vertigo during session.  Pt would benefit from skilled PT to address noted impairments and functional limitations (see below for any additional details).  Upon hospital discharge, pt would benefit from HHPT.    Follow Up Recommendations Home health PT    Equipment Recommendations  Rolling walker with 5" wheels (youth sized)    Recommendations for Other Services       Precautions / Restrictions Precautions Precautions: Fall Restrictions Weight Bearing Restrictions: No      Mobility  Bed Mobility Overal bed mobility: Modified Independent             General bed mobility comments: Semi-supine to/from sitting with HOB elevated with mild increased effort and time to perform on own    Transfers Overall transfer level: Needs assistance Equipment used: Straight cane;Rolling walker (2 wheeled) Transfers: Sit to/from Stand Sit to Stand: Supervision         General transfer comment: x1 trial with cane and x2 trials with RW; steady  and safe; mild increased effort to stand noted  Ambulation/Gait Ambulation/Gait assistance: Min guard Gait Distance (Feet):  (30 feet with SPC; 60 feet x2 with RW) Assistive device: Straight cane;Rolling walker (2 wheeled)   Gait velocity: decreased   General Gait Details: partial step through gait pattern; pt holding onto IV pole when walking with cane (pt reporting feeling more steady this way) so switched to RW and pt appearing more comfortable and steady  Stairs            Wheelchair Mobility    Modified Rankin (Stroke Patients Only)       Balance Overall balance assessment: Needs assistance Sitting-balance support: No upper extremity supported;Feet supported Sitting balance-Leahy Scale: Normal Sitting balance - Comments: steady sitting reaching outside BOS   Standing balance support: Bilateral upper extremity supported;During functional activity Standing balance-Leahy Scale: Good Standing balance comment: steady ambulating with RW                             Pertinent Vitals/Pain Pain Assessment: No/denies pain Vitals (HR and O2 on room air) stable and WFL throughout treatment session.    Home Living Family/patient expects to be discharged to:: Private residence Living Arrangements: Alone Available Help at Discharge: Family Type of Home: House Home Access: Ramped entrance     Home Layout: One level;Other (Comment) (2 steps with B railings (can reach both) down to den) Home Equipment: Dan Humphreys - standard;Walker - 4 wheels;Cane - single point      Prior Function Level of  Independence: Independent with assistive device(s)         Comments: Ambulatory with SPC; no falls since childhoood     Hand Dominance        Extremity/Trunk Assessment   Upper Extremity Assessment Upper Extremity Assessment: Generalized weakness    Lower Extremity Assessment Lower Extremity Assessment: Generalized weakness       Communication   Communication:  No difficulties  Cognition Arousal/Alertness: Awake/alert Behavior During Therapy: WFL for tasks assessed/performed Overall Cognitive Status: Within Functional Limits for tasks assessed                                        General Comments  Nursing cleared pt for participation in physical therapy.  Pt agreeable to PT session.    Exercises  Gait training with RW   Assessment/Plan    PT Assessment Patient needs continued PT services  PT Problem List Decreased strength;Decreased activity tolerance;Decreased balance;Decreased mobility;Decreased knowledge of use of DME       PT Treatment Interventions DME instruction;Gait training;Stair training;Functional mobility training;Therapeutic activities;Therapeutic exercise;Balance training;Patient/family education    PT Goals (Current goals can be found in the Care Plan section)  Acute Rehab PT Goals Patient Stated Goal: to go home PT Goal Formulation: With patient Time For Goal Achievement: 05/27/21 Potential to Achieve Goals: Good    Frequency Min 2X/week   Barriers to discharge        Co-evaluation               AM-PAC PT "6 Clicks" Mobility  Outcome Measure Help needed turning from your back to your side while in a flat bed without using bedrails?: None Help needed moving from lying on your back to sitting on the side of a flat bed without using bedrails?: None Help needed moving to and from a bed to a chair (including a wheelchair)?: A Little Help needed standing up from a chair using your arms (e.g., wheelchair or bedside chair)?: A Little Help needed to walk in hospital room?: A Little Help needed climbing 3-5 steps with a railing? : A Little 6 Click Score: 20    End of Session   Activity Tolerance: Patient tolerated treatment well Patient left: in bed;with call bell/phone within reach;with bed alarm set Nurse Communication: Mobility status;Precautions PT Visit Diagnosis: Other abnormalities of  gait and mobility (R26.89);Muscle weakness (generalized) (M62.81)    Time: 8588-5027 PT Time Calculation (min) (ACUTE ONLY): 23 min   Charges:   PT Evaluation $PT Eval Low Complexity: 1 Low PT Treatments $Gait Training: 8-22 mins       Hendricks Limes, PT 05/13/21, 11:56 AM

## 2021-05-13 NOTE — Plan of Care (Signed)

## 2021-05-13 NOTE — Progress Notes (Signed)
Mobility Specialist - Progress Note   05/13/21 1300  Mobility  Range of Motion/Exercises Right leg;Left leg (AP, SLR, HS, ABD, ISO, QS,)  Level of Assistance Minimal assist, patient does 75% or more  Distance Ambulated (ft) 0 ft  Mobility Sit up in bed/chair position for meals  Mobility Response Tolerated well  Mobility performed by Mobility specialist  $Mobility charge 1 Mobility    Pt lying in bed upon arrival, utilizing RA. Participated in bed-level therex: ankle pumps, straight leg raises, heel slides, abduction, isometrics, and quad sets. Pt does voice feeling B knee buckling during performance. Pt sat up in bed for lunch arrival. Lunch and needs placed in reach, alarm set.    Connie Frederick Mobility Specialist 05/13/21, 1:11 PM

## 2021-06-25 ENCOUNTER — Encounter: Payer: Self-pay | Admitting: Podiatry

## 2021-06-25 ENCOUNTER — Other Ambulatory Visit: Payer: Self-pay

## 2021-06-25 ENCOUNTER — Encounter (INDEPENDENT_AMBULATORY_CARE_PROVIDER_SITE_OTHER): Payer: Self-pay

## 2021-06-25 ENCOUNTER — Ambulatory Visit (INDEPENDENT_AMBULATORY_CARE_PROVIDER_SITE_OTHER): Payer: Medicare Other | Admitting: Podiatry

## 2021-06-25 DIAGNOSIS — E0843 Diabetes mellitus due to underlying condition with diabetic autonomic (poly)neuropathy: Secondary | ICD-10-CM

## 2021-06-25 DIAGNOSIS — B351 Tinea unguium: Secondary | ICD-10-CM

## 2021-06-25 DIAGNOSIS — L608 Other nail disorders: Secondary | ICD-10-CM

## 2021-06-25 DIAGNOSIS — M79676 Pain in unspecified toe(s): Secondary | ICD-10-CM | POA: Diagnosis not present

## 2021-06-25 NOTE — Progress Notes (Signed)
This patient returns to my office for at risk foot care.  This patient requires this care by a professional since this patient will be at risk due to having diabetes and neuropathy.  This patient is unable to cut nails herself since the patient cannot reach her nails.These nails are painful walking and wearing shoes.  This patient presents for at risk foot care today.  General Appearance  Alert, conversant and in no acute stress.  Vascular  Dorsalis pedis and posterior tibial  pulses are weakly  palpable  bilaterally.  Capillary return is within normal limits  bilaterally. Temperature is within normal limits  bilaterally.  Neurologic  Senn-Weinstein monofilament wire test within normal limits  bilaterally. Muscle power within normal limits bilaterally.  Nails Thick disfigured discolored nails with subungual debris  from hallux to fifth toes bilaterally.Pincer nails hallux  B/L. No evidence of bacterial infection or drainage bilaterally.  Orthopedic  No limitations of motion  feet .  No crepitus or effusions noted.  No bony pathology or digital deformities noted. HAV with hammer toes 2  B/L.  Skin  normotropic skin with no porokeratosis noted bilaterally.  No signs of infections or ulcers noted.     Onychomycosis  Pain in right toes  Pain in left toes  Consent was obtained for treatment procedures.   Mechanical debridement of nails 1-5  bilaterally performed with a nail nipper.  Filed with dremel without incident.    Return office visit 9  weeks                    Told patient to return for periodic foot care and evaluation due to potential at risk complications.   Helane Gunther DPM

## 2021-09-10 ENCOUNTER — Ambulatory Visit: Payer: Medicare Other | Admitting: Podiatry

## 2021-10-01 ENCOUNTER — Ambulatory Visit: Payer: Medicare Other | Admitting: Podiatry

## 2021-10-08 ENCOUNTER — Encounter: Payer: Self-pay | Admitting: Podiatry

## 2021-10-08 ENCOUNTER — Ambulatory Visit (INDEPENDENT_AMBULATORY_CARE_PROVIDER_SITE_OTHER): Payer: Medicare Other | Admitting: Podiatry

## 2021-10-08 ENCOUNTER — Other Ambulatory Visit: Payer: Self-pay

## 2021-10-08 DIAGNOSIS — M79676 Pain in unspecified toe(s): Secondary | ICD-10-CM | POA: Diagnosis not present

## 2021-10-08 DIAGNOSIS — L608 Other nail disorders: Secondary | ICD-10-CM

## 2021-10-08 DIAGNOSIS — B351 Tinea unguium: Secondary | ICD-10-CM | POA: Diagnosis not present

## 2021-10-08 DIAGNOSIS — E0843 Diabetes mellitus due to underlying condition with diabetic autonomic (poly)neuropathy: Secondary | ICD-10-CM | POA: Diagnosis not present

## 2021-10-08 NOTE — Progress Notes (Signed)
This patient returns to my office for at risk foot care.  This patient requires this care by a professional since this patient will be at risk due to having diabetes and neuropathy.  This patient is unable to cut nails herself since the patient cannot reach her nails.These nails are painful walking and wearing shoes.  This patient presents for at risk foot care today.  General Appearance  Alert, conversant and in no acute stress.  Vascular  Dorsalis pedis and posterior tibial  pulses are weakly  palpable  bilaterally.  Capillary return is within normal limits  bilaterally. Temperature is within normal limits  bilaterally.  Neurologic  Senn-Weinstein monofilament wire test within normal limits  bilaterally. Muscle power within normal limits bilaterally.  Nails Thick disfigured discolored nails with subungual debris  from hallux to fifth toes bilaterally.Pincer nails hallux  B/L. No evidence of bacterial infection or drainage bilaterally.  Orthopedic  No limitations of motion  feet .  No crepitus or effusions noted.  No bony pathology or digital deformities noted. HAV with hammer toes 2  B/L.  Skin  normotropic skin with no porokeratosis noted bilaterally.  No signs of infections or ulcers noted.     Onychomycosis  Pain in right toes  Pain in left toes  Consent was obtained for treatment procedures.   Mechanical debridement of nails 1-5  bilaterally performed with a nail nipper.  Filed with dremel without incident.    Return office visit 10   weeks                    Told patient to return for periodic foot care and evaluation due to potential at risk complications.   Shelbie Franken DPM  

## 2022-01-07 ENCOUNTER — Ambulatory Visit (INDEPENDENT_AMBULATORY_CARE_PROVIDER_SITE_OTHER): Payer: Medicare Other | Admitting: Podiatry

## 2022-01-07 ENCOUNTER — Encounter: Payer: Self-pay | Admitting: Podiatry

## 2022-01-07 DIAGNOSIS — M79676 Pain in unspecified toe(s): Secondary | ICD-10-CM | POA: Diagnosis not present

## 2022-01-07 DIAGNOSIS — L608 Other nail disorders: Secondary | ICD-10-CM | POA: Diagnosis not present

## 2022-01-07 DIAGNOSIS — B351 Tinea unguium: Secondary | ICD-10-CM

## 2022-01-07 DIAGNOSIS — E0843 Diabetes mellitus due to underlying condition with diabetic autonomic (poly)neuropathy: Secondary | ICD-10-CM | POA: Diagnosis not present

## 2022-01-07 NOTE — Progress Notes (Signed)
This patient returns to my office for at risk foot care.  This patient requires this care by a professional since this patient will be at risk due to having diabetes and neuropathy.  This patient is unable to cut nails herself since the patient cannot reach her nails.These nails are painful walking and wearing shoes.  This patient presents for at risk foot care today.  General Appearance  Alert, conversant and in no acute stress.  Vascular  Dorsalis pedis and posterior tibial  pulses are weakly  palpable  bilaterally.  Capillary return is within normal limits  bilaterally. Temperature is within normal limits  bilaterally.  Neurologic  Senn-Weinstein monofilament wire test within normal limits  bilaterally. Muscle power within normal limits bilaterally.  Nails Thick disfigured discolored nails with subungual debris  from hallux to fifth toes bilaterally.Pincer nails hallux  B/L. No evidence of bacterial infection or drainage bilaterally.  Orthopedic  No limitations of motion  feet .  No crepitus or effusions noted.  No bony pathology or digital deformities noted. HAV with hammer toes 2  B/L.  Skin  normotropic skin with no porokeratosis noted bilaterally.  No signs of infections or ulcers noted.     Onychomycosis  Pain in right toes  Pain in left toes  Consent was obtained for treatment procedures.   Mechanical debridement of nails 1-5  bilaterally performed with a nail nipper.  Filed with dremel without incident.    Return office visit 10   weeks                    Told patient to return for periodic foot care and evaluation due to potential at risk complications.   Corwin Kuiken DPM  

## 2022-03-07 IMAGING — CT CT HEAD W/O CM
3 series · 16 of 47 positions shown, 19 images · non-contrast
Comparison: 05/14/2018

CLINICAL DATA: Mental status change, unknown cause.

EXAM:
CT HEAD WITHOUT CONTRAST
TECHNIQUE: Contiguous axial images were obtained from the base of the skull
through the vertex without intravenous contrast.

[Series 3: coronal soft tissue · coronal · 0.29mm/px · 3 of 66 slices shown]
[im 22/66  brain]
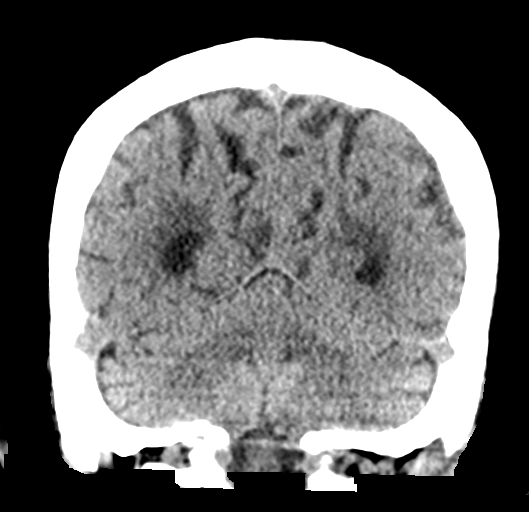
[im 29/66  brain]
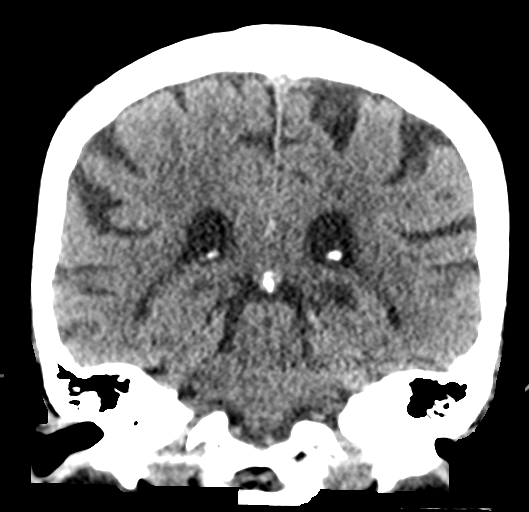
[im 37/66  brain]
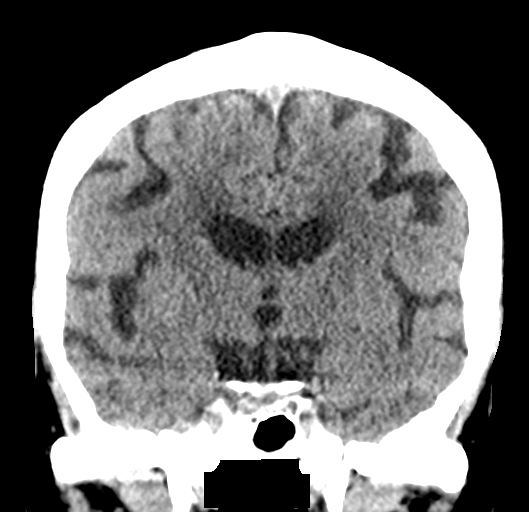

[Series 4: sagittal soft tissue · sagittal · 0.29mm/px · 3 of 52 slices shown]
[im 18/52  brain]
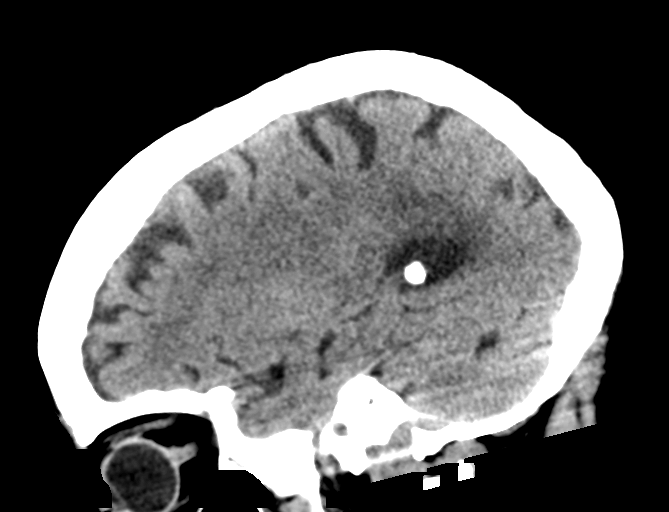
[im 26/52  brain]
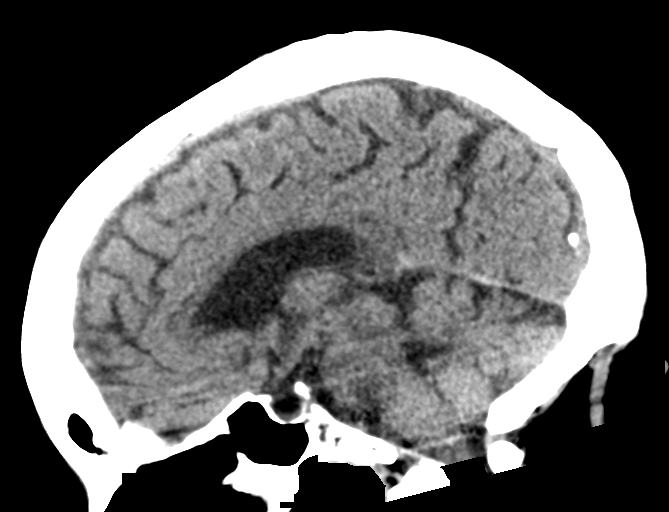
[im 35/52  brain]
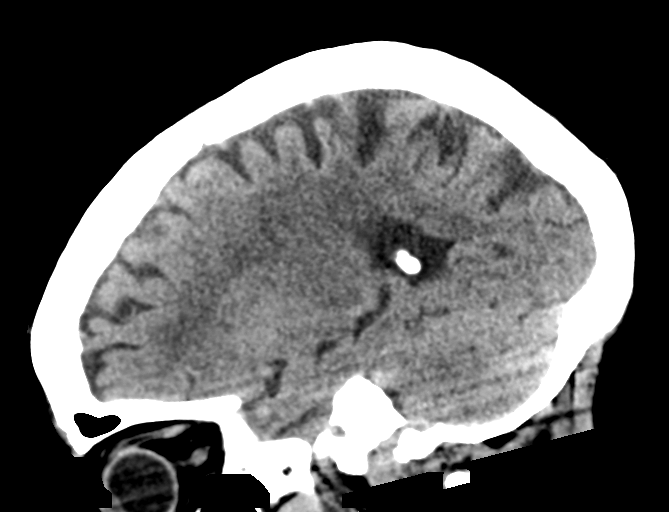

[Series 5: head wo · axial · 0.42mm/px · z∈[+742,+867]mm · 10 of 30 slices shown, 13 images]
[im 3/30  brain]
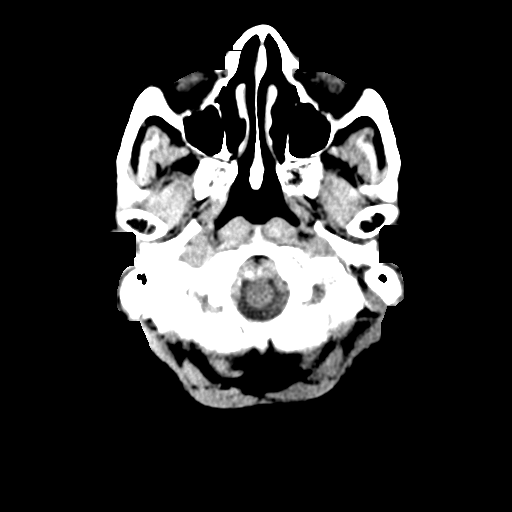
[im 3/30  bone]
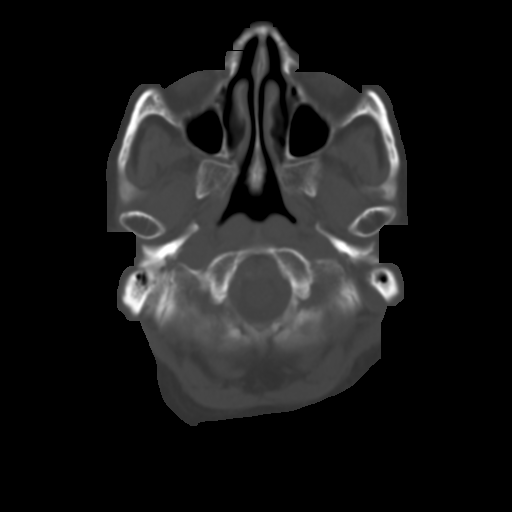
[im 6/30  brain]
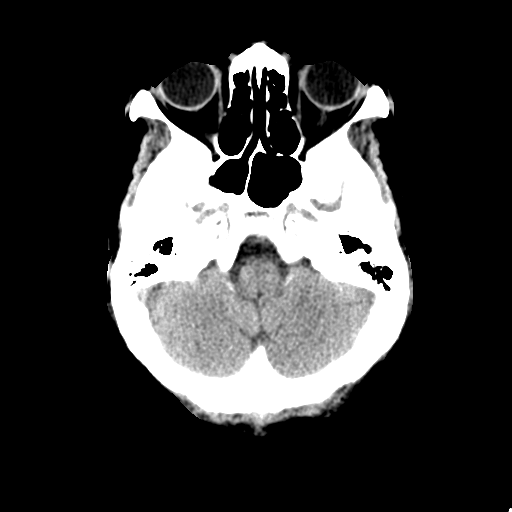
[im 9/30  brain]
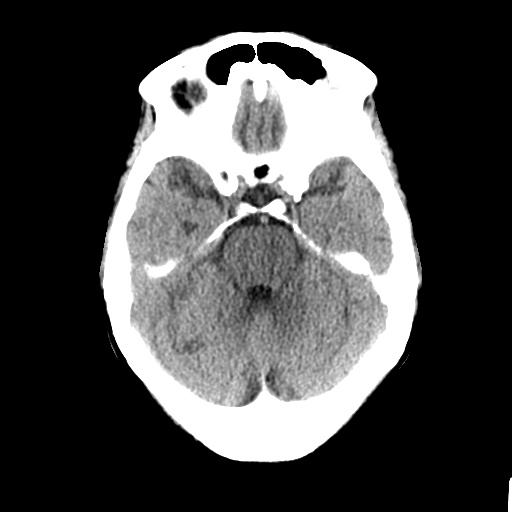
[im 11/30  brain]
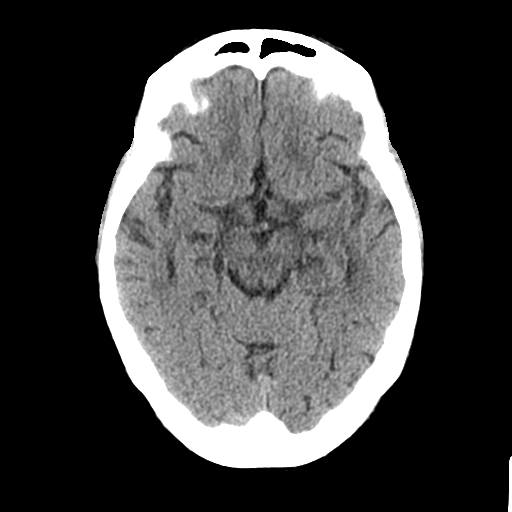
[im 14/30  brain]
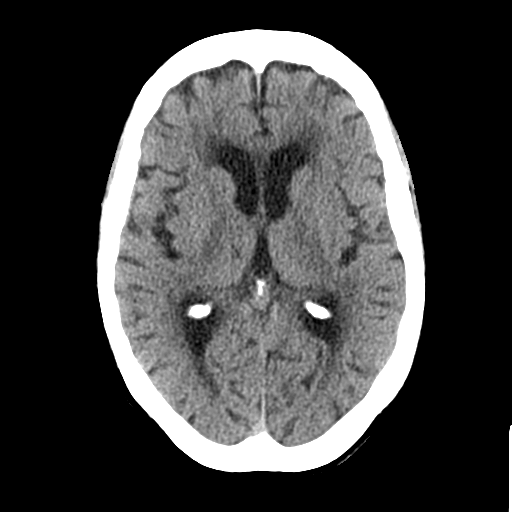
[im 14/30  bone]
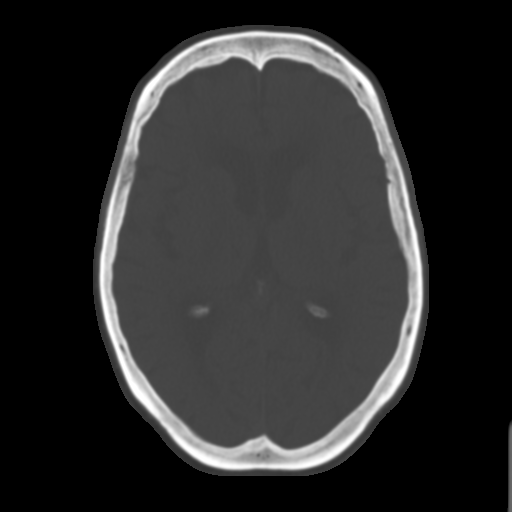
[im 17/30  brain]
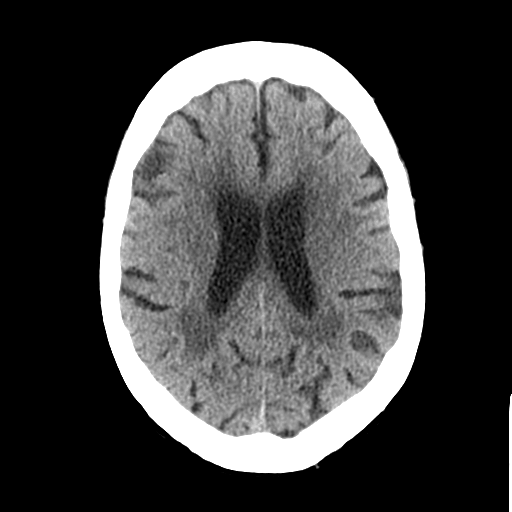
[im 20/30  brain]
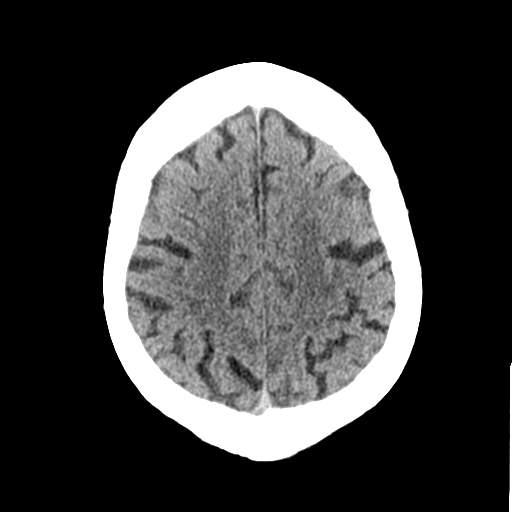
[im 23/30  brain]
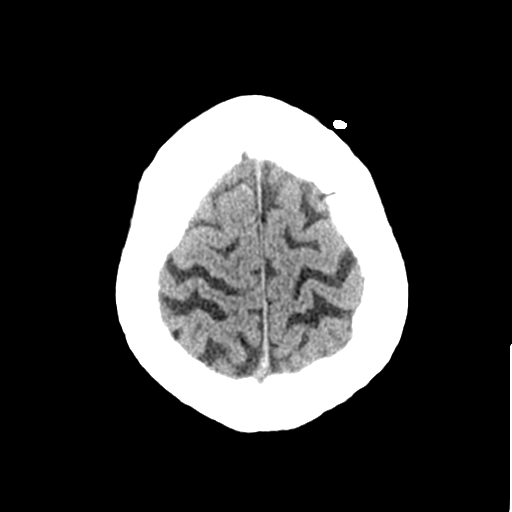
[im 25/30  brain]
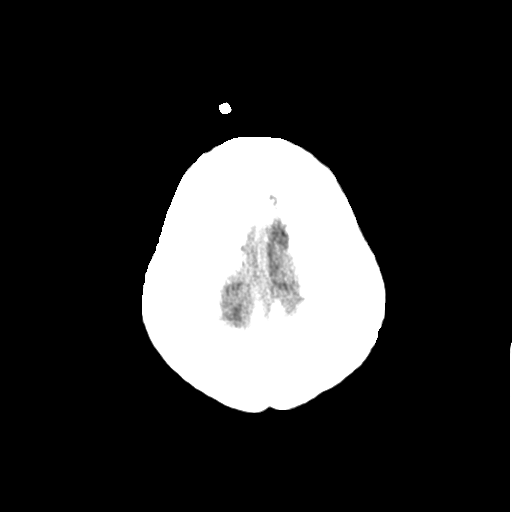
[im 25/30  bone]
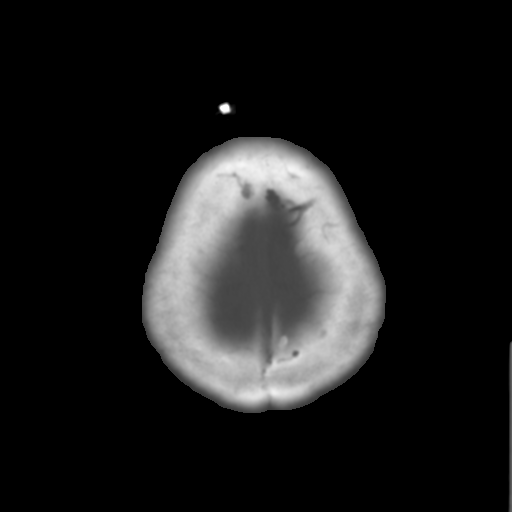
[im 28/30  brain]
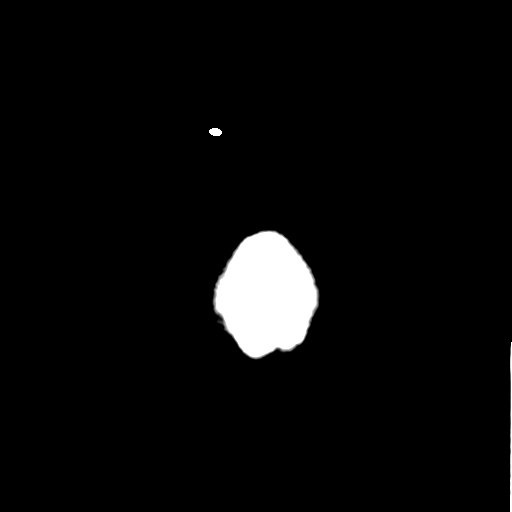

[16 of 47 positions shown; findings below may reference images not displayed]

FINDINGS: Brain: Chronic low-density in the white matter. Chronic focal area
of low-density in the right cerebellar hemisphere compatible with
old insult. No evidence for acute hemorrhage, mass lesion, midline
shift, hydrocephalus or new large infarct.

Vascular: No hyperdense vessel or unexpected calcification.

Skull: Normal. Negative for fracture or focal lesion.

Sinuses/Orbits: No acute finding.

Other: None.
IMPRESSION: 1. No acute intracranial abnormality.
2. Evidence for chronic small vessel ischemic changes.
3. Old insult/infarct in the right cerebellar hemisphere.

## 2022-03-07 IMAGING — DX DG CHEST 1V PORT
1 series · 1 of 1 positions shown · non-contrast
Comparison: 11/05/2020, 03/15/2018

CLINICAL DATA: Hypertension, diabetes.

EXAM:
PORTABLE CHEST 1 VIEW

[chest ap]
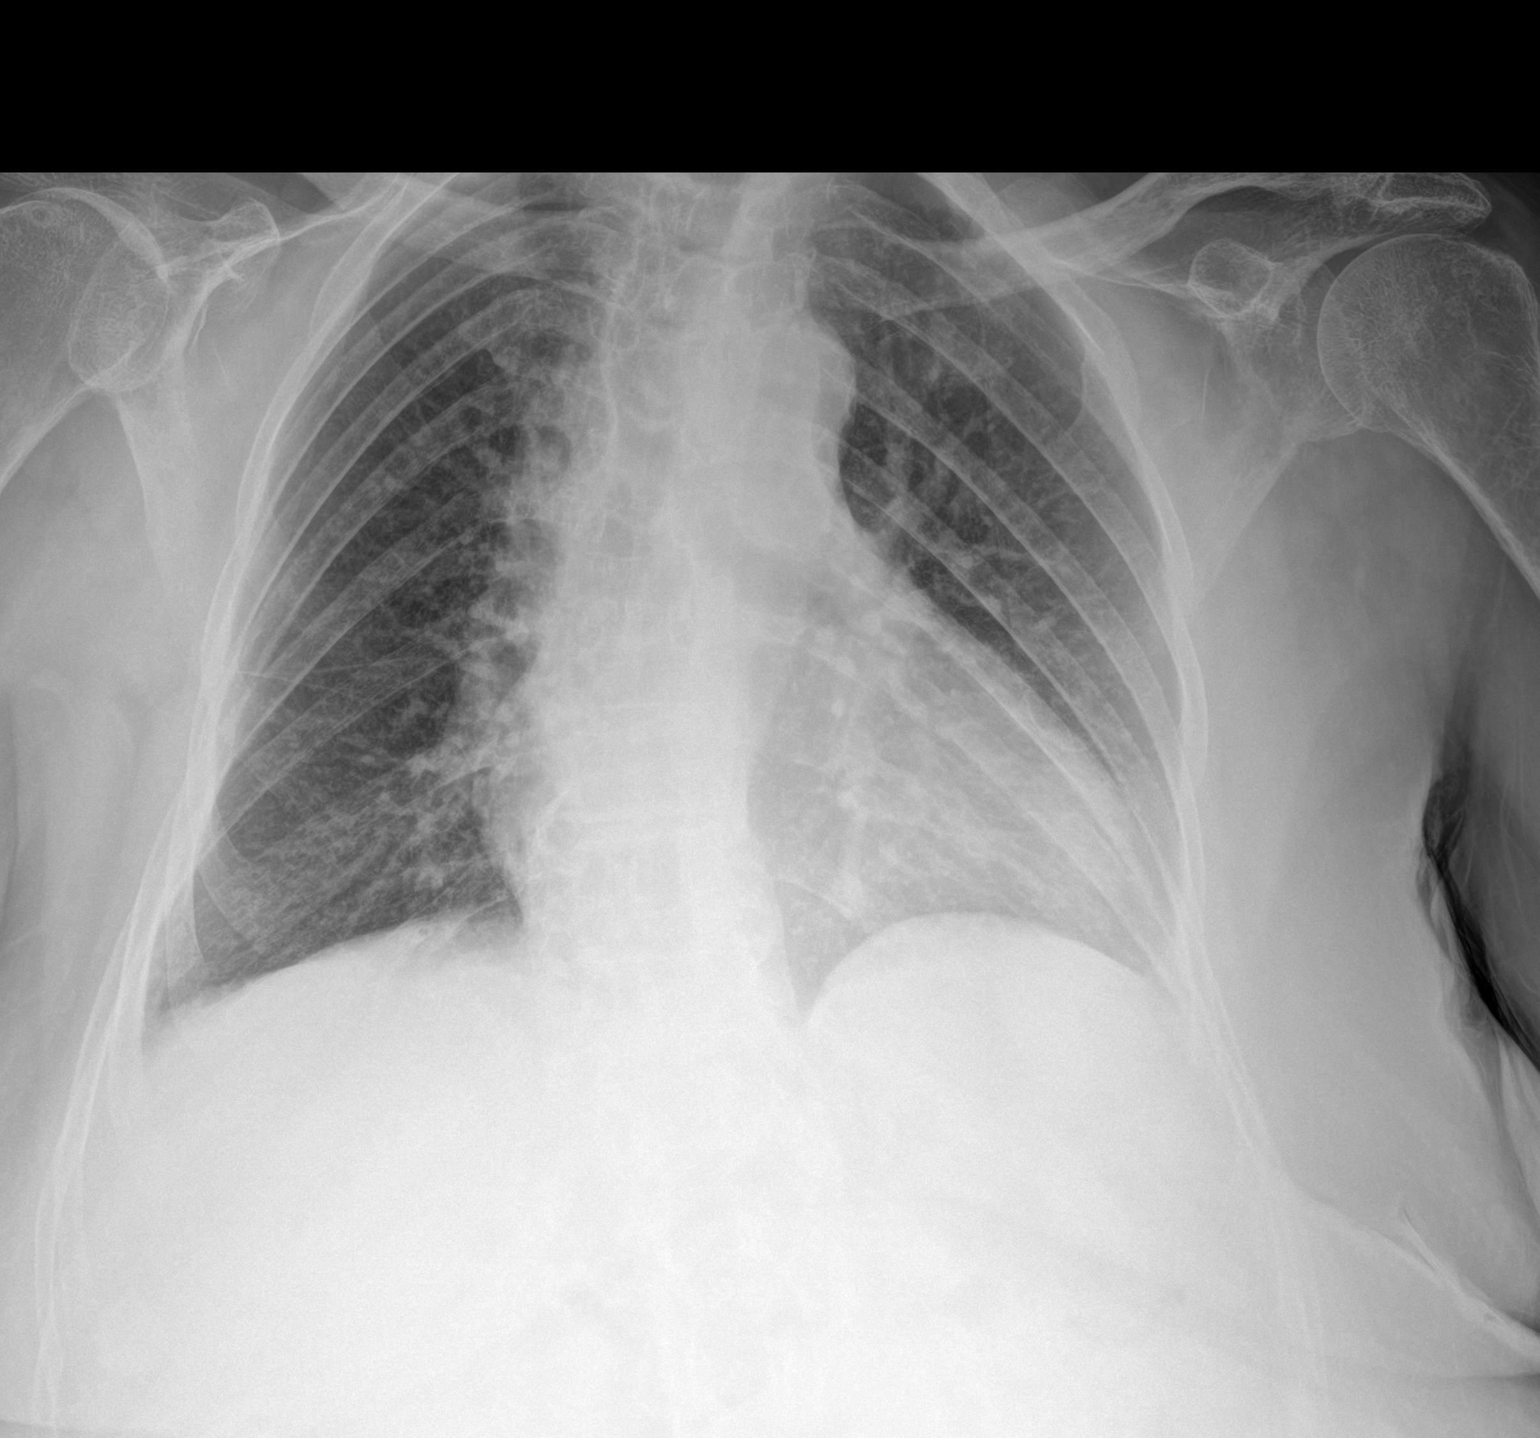

[1 of 1 positions shown; findings below may reference images not displayed]

FINDINGS: Upper limits of normal heart size is likely accentuated by AP semi
upright technique. Atherosclerotic calcification of the aortic knob.
Mildly prominent interstitial markings throughout both lungs,
increased from prior. Trace bilateral effusions are not excluded. No
focal consolidation. No pneumothorax
IMPRESSION: Mildly prominent interstitial markings throughout both lungs,
increased from prior. Findings may reflect mild edema versus
atypical/viral infection.

## 2022-03-08 ENCOUNTER — Ambulatory Visit: Payer: Medicare Other | Admitting: Podiatry

## 2022-03-18 ENCOUNTER — Ambulatory Visit (INDEPENDENT_AMBULATORY_CARE_PROVIDER_SITE_OTHER): Payer: Medicare Other | Admitting: Podiatry

## 2022-03-18 ENCOUNTER — Encounter: Payer: Self-pay | Admitting: Podiatry

## 2022-03-18 DIAGNOSIS — E0843 Diabetes mellitus due to underlying condition with diabetic autonomic (poly)neuropathy: Secondary | ICD-10-CM

## 2022-03-18 DIAGNOSIS — B351 Tinea unguium: Secondary | ICD-10-CM

## 2022-03-18 DIAGNOSIS — M79675 Pain in left toe(s): Secondary | ICD-10-CM | POA: Diagnosis not present

## 2022-03-18 DIAGNOSIS — G629 Polyneuropathy, unspecified: Secondary | ICD-10-CM

## 2022-03-18 DIAGNOSIS — M79674 Pain in right toe(s): Secondary | ICD-10-CM

## 2022-03-18 DIAGNOSIS — L608 Other nail disorders: Secondary | ICD-10-CM

## 2022-03-18 NOTE — Progress Notes (Signed)
This patient returns to my office for at risk foot care.  This patient requires this care by a professional since this patient will be at risk due to having diabetes and neuropathy.  This patient is unable to cut nails herself since the patient cannot reach her nails.These nails are painful walking and wearing shoes.  This patient presents for at risk foot care today.  General Appearance  Alert, conversant and in no acute stress.  Vascular  Dorsalis pedis and posterior tibial  pulses are weakly  palpable  bilaterally.  Capillary return is within normal limits  bilaterally. Temperature is within normal limits  bilaterally.  Neurologic  Senn-Weinstein monofilament wire test within normal limits  bilaterally. Muscle power within normal limits bilaterally.  Nails Thick disfigured discolored nails with subungual debris  from hallux to fifth toes bilaterally.Pincer nails hallux  B/L. No evidence of bacterial infection or drainage bilaterally.  Orthopedic  No limitations of motion  feet .  No crepitus or effusions noted.  No bony pathology or digital deformities noted. HAV with hammer toes 2  B/L.  Skin  normotropic skin with no porokeratosis noted bilaterally.  No signs of infections or ulcers noted.     Onychomycosis  Pain in right toes  Pain in left toes  Consent was obtained for treatment procedures.   Mechanical debridement of nails 1-5  bilaterally performed with a nail nipper.  Filed with dremel without incident.    Return office visit 10   weeks                    Told patient to return for periodic foot care and evaluation due to potential at risk complications.   Helane Gunther DPM

## 2022-04-27 ENCOUNTER — Emergency Department: Payer: Medicare Other

## 2022-04-27 ENCOUNTER — Emergency Department
Admission: EM | Admit: 2022-04-27 | Discharge: 2022-04-27 | Disposition: A | Discharge status: Home / Self Care | Payer: Medicare Other | Attending: Emergency Medicine | Admitting: Emergency Medicine

## 2022-04-27 ENCOUNTER — Other Ambulatory Visit: Payer: Self-pay

## 2022-04-27 ENCOUNTER — Encounter: Payer: Self-pay | Admitting: Emergency Medicine

## 2022-04-27 DIAGNOSIS — Z7984 Long term (current) use of oral hypoglycemic drugs: Secondary | ICD-10-CM | POA: Insufficient documentation

## 2022-04-27 DIAGNOSIS — R7989 Other specified abnormal findings of blood chemistry: Secondary | ICD-10-CM | POA: Insufficient documentation

## 2022-04-27 DIAGNOSIS — Z79899 Other long term (current) drug therapy: Secondary | ICD-10-CM | POA: Insufficient documentation

## 2022-04-27 DIAGNOSIS — Z7982 Long term (current) use of aspirin: Secondary | ICD-10-CM | POA: Diagnosis not present

## 2022-04-27 DIAGNOSIS — I10 Essential (primary) hypertension: Secondary | ICD-10-CM | POA: Diagnosis not present

## 2022-04-27 DIAGNOSIS — E119 Type 2 diabetes mellitus without complications: Secondary | ICD-10-CM | POA: Diagnosis not present

## 2022-04-27 DIAGNOSIS — I2510 Atherosclerotic heart disease of native coronary artery without angina pectoris: Secondary | ICD-10-CM | POA: Diagnosis not present

## 2022-04-27 DIAGNOSIS — R600 Localized edema: Secondary | ICD-10-CM | POA: Insufficient documentation

## 2022-04-27 DIAGNOSIS — M7989 Other specified soft tissue disorders: Secondary | ICD-10-CM | POA: Diagnosis present

## 2022-04-27 DIAGNOSIS — R609 Edema, unspecified: Secondary | ICD-10-CM

## 2022-04-27 LAB — COMPREHENSIVE METABOLIC PANEL
ALT: 14 U/L (ref 0–44)
AST: 25 U/L (ref 15–41)
Albumin: 3.9 g/dL (ref 3.5–5.0)
Alkaline Phosphatase: 49 U/L (ref 38–126)
Anion gap: 7 (ref 5–15)
BUN: 17 mg/dL (ref 8–23)
CO2: 26 mmol/L (ref 22–32)
Calcium: 9.5 mg/dL (ref 8.9–10.3)
Chloride: 106 mmol/L (ref 98–111)
Creatinine, Ser: 1.1 mg/dL — ABNORMAL HIGH (ref 0.44–1.00)
GFR, Estimated: 49 mL/min — ABNORMAL LOW (ref >60)
Glucose, Bld: 118 mg/dL — ABNORMAL HIGH (ref 70–99)
Potassium: 4.2 mmol/L (ref 3.5–5.1)
Sodium: 139 mmol/L (ref 135–145)
Total Bilirubin: 0.8 mg/dL (ref 0.3–1.2)
Total Protein: 6.8 g/dL (ref 6.5–8.1)

## 2022-04-27 LAB — CBC WITH DIFFERENTIAL/PLATELET
Abs Immature Granulocytes: 0.01 10*3/uL (ref 0.00–0.07)
Basophils Absolute: 0 10*3/uL (ref 0.0–0.1)
Basophils Relative: 1 %
Eosinophils Absolute: 0.1 10*3/uL (ref 0.0–0.5)
Eosinophils Relative: 1 %
HCT: 31.7 % — ABNORMAL LOW (ref 36.0–46.0)
Hemoglobin: 10.2 g/dL — ABNORMAL LOW (ref 12.0–15.0)
Immature Granulocytes: 0 %
Lymphocytes Relative: 43 %
Lymphs Abs: 2.3 10*3/uL (ref 0.7–4.0)
MCH: 26.7 pg (ref 26.0–34.0)
MCHC: 32.2 g/dL (ref 30.0–36.0)
MCV: 83 fL (ref 80.0–100.0)
Monocytes Absolute: 0.4 10*3/uL (ref 0.1–1.0)
Monocytes Relative: 7 %
Neutro Abs: 2.5 10*3/uL (ref 1.7–7.7)
Neutrophils Relative %: 48 %
Platelets: 246 10*3/uL (ref 150–400)
RBC: 3.82 MIL/uL — ABNORMAL LOW (ref 3.87–5.11)
RDW: 13.2 % (ref 11.5–15.5)
WBC: 5.3 10*3/uL (ref 4.0–10.5)
nRBC: 0 % (ref 0.0–0.2)

## 2022-04-27 LAB — URINALYSIS, ROUTINE W REFLEX MICROSCOPIC
Bilirubin Urine: NEGATIVE
Glucose, UA: NEGATIVE mg/dL
Hgb urine dipstick: NEGATIVE
Ketones, ur: NEGATIVE mg/dL
Nitrite: NEGATIVE
Protein, ur: NEGATIVE mg/dL
Specific Gravity, Urine: 1.01 (ref 1.005–1.030)
pH: 8 (ref 5.0–8.0)

## 2022-04-27 LAB — BRAIN NATRIURETIC PEPTIDE: B Natriuretic Peptide: 119.4 pg/mL — ABNORMAL HIGH (ref 0.0–100.0)

## 2022-04-27 NOTE — Discharge Instructions (Addendum)
I recommend keeping your legs elevated at rest and wearing compression stockings as much as possible.  Please decrease the salt/sodium in your diet.  Please return to the ED if you develop chest pain, shortness of breath or worsening leg swelling especially if swelling is assymetric (one leg larger than the other).

## 2022-04-27 NOTE — ED Provider Triage Note (Addendum)
Emergency Medicine Provider Triage Evaluation Note  Connie Frederick, a 88 y.o. female  was evaluated in triage.  Presents to the ED with left leg swelling.  She reports onset of swelling to the left leg this morning.  Patient is ambulatory with a cane but denies any recent injury, trauma, fall.  She describes a numbness sensation to the right lower extremity.  Review of Systems  Positive: LLE edema Negative: CP, SOB  Physical Exam  BP 136/65 (BP Location: Left Arm)   Pulse 85   Temp 98.3 F (36.8 C) (Oral)   Resp 18   Ht 5' (1.524 m)   Wt 72.6 kg   SpO2 99%   BMI 31.25 kg/m   Medical Decision Making  Medically screening exam initiated at 3:20 PM.  Appropriate orders placed.  Connie Frederick was informed that the remainder of the evaluation will be completed by another provider, this initial triage assessment does not replace that evaluation, and the importance of remaining in the ED until their evaluation is complete.  Patient to the ED for onset of left lower extremity edema with symptoms beginning this morning patient denies any associated chest pain or shortness of breath.   Lissa Hoard, PA-C 04/27/22 1531    Lissa Hoard, [place] 04/27/22 1535

## 2022-04-27 NOTE — ED Provider Notes (Signed)
[facility] Provider Note    Event Date/Time   First MD Initiated Contact with Patient 04/27/22 2302     (approximate)   History   Leg Swelling   HPI  Connie Frederick is a 88 y.o. female with history of hypertension, diabetes, CAD who presents to the emergency department with lower extremity swelling.  States that her left leg is started to feel tight but she has symmetric swelling in both of her feet.  She denies any chest pain or shortness of breath.  She is not on diuretics.  No injury to her leg.  No numbness, tingling.  Her daughter states that she does eat a high sodium diet.   History provided by patient and family.    Past Medical History:  Diagnosis Date   Diabetes mellitus without complication ([place])    Hypertension    Myocardial infarction ([place])    Vertigo    HOSPITALIZED 2 WEEKS AGO    Past Surgical History:  Procedure Laterality Date   ABDOMINAL HYSTERECTOMY     CATARACT EXTRACTION W/PHACO Left 06/08/2018   Procedure: CATARACT EXTRACTION PHACO AND INTRAOCULAR LENS PLACEMENT ([place]);  Surgeon: Leandrew Koyanagi, MD;  Location: [facility] ORS;  Service: Ophthalmology;  Laterality: Left;  [place] 04:54 AP% 14.4 CDE 13.2 Fluid pack lot # [id number]   CATARACT EXTRACTION W/PHACO Right 06/28/2018   Procedure: CATARACT EXTRACTION PHACO AND INTRAOCULAR LENS PLACEMENT ([place]) RIGHT DIABETIC;  Surgeon: Leandrew Koyanagi, MD;  Location: [place];  Service: Ophthalmology;  Laterality: Right;  diabetic - oral meds   VEIN LIGATION      MEDICATIONS:  Prior to Admission medications   Medication Sig Start Date End Date Taking? Authorizing Provider  acetaminophen (TYLENOL) 650 MG CR tablet Take 650 mg by mouth every 8 (eight) hours as needed for pain.    [provider]  amLODipine (NORVASC) 2.5 MG tablet Take 2.5 mg by mouth daily. 06/15/21   [provider]  aspirin EC 81 MG tablet Take 81 mg by mouth daily.    [provider]  Cholecalciferol 25 MCG (1000 UT) tablet Take 1,000 Units by mouth daily.    [provider]  Cyanocobalamin (VITAMIN B 12 PO) Take 1,000 mcg by mouth.    [provider]  donepezil (ARICEPT) 5 MG tablet donepezil 5 mg tablet    [provider]  gabapentin (NEURONTIN) 100 MG capsule Take 1 capsule (100 mg total) by mouth 3 (three) times daily as needed. 08/11/19 08/10/20  Delman Kitten, MD  glipiZIDE (GLUCOTROL XL) 2.5 MG 24 hr tablet Take 2.5 mg by mouth daily. 08/25/15   [provider]  hydrochlorothiazide (MICROZIDE) 12.5 MG capsule Take 12.5 mg by mouth daily. 06/10/21   [provider]  losartan (COZAAR) 100 MG tablet Take 100 mg by mouth daily. 08/11/15   [provider]  meclizine (ANTIVERT) 25 MG tablet Take 1 tablet (25 mg total) by mouth 3 (three) times daily as needed for dizziness. 03/27/19   Merlyn Lot, MD  meloxicam (MOBIC) 7.5 MG tablet meloxicam 7.5 mg tablet  Take 1 tablet every day by oral route.    [provider]  ondansetron (ZOFRAN ODT) 4 MG disintegrating tablet Take 1 tablet (4 mg total) by mouth every 8 (eight) hours as needed for nausea or vomiting. 08/12/18   Nena Polio, MD  polyethylene glycol powder (GLYCOLAX/MIRALAX) powder Take 17 g by mouth daily as needed for mild constipation.  10/14/15   [provider]  promethazine (PHENERGAN) 12.5 MG tablet Take 1 tablet (12.5 mg total) by mouth every 8 (eight) hours as needed for nausea or vomiting. 05/15/18   Gladstone Lighter, MD  rosuvastatin (CRESTOR) 5 MG tablet Take 5 mg by mouth daily.    [provider]  vitamin B-12 (CYANOCOBALAMIN) 1000 MCG tablet Take 1,000 mcg by mouth daily. 10/07/21   [provider]    Physical Exam   Triage Vital Signs: ED Triage Vitals  Enc Vitals Group     BP 04/27/22 1506 136/65     Pulse Rate 04/27/22 1506 85     Resp 04/27/22 1506 18     Temp 04/27/22 1506 98.3 F (36.8 C)     Temp  Source 04/27/22 1506 Oral     SpO2 04/27/22 1506 99 %     Weight 04/27/22 1509 160 lb (72.6 kg)     Height 04/27/22 1509 5' (1.524 m)     Head Circumference --      Peak Flow --      Pain Score 04/27/22 1506 0     Pain Loc --      Pain Edu? --      Excl. in [place]? --     Most recent vital signs: Vitals:   04/27/22 1506 04/27/22 2327  BP: 136/65 (!) 156/96  Pulse: 85 89  Resp: 18 18  Temp: 98.3 F (36.8 C) (!) 97.5 F (36.4 C)  SpO2: 99% 100%    CONSTITUTIONAL: Alert and oriented and responds appropriately to questions. Well-appearing; well-nourished HEAD: Normocephalic, atraumatic EYES: Conjunctivae clear, pupils appear equal, sclera nonicteric ENT: normal nose; moist mucous membranes NECK: Supple, normal ROM CARD: RRR; S1 and S2 appreciated; no murmurs, no clicks, no rubs, no gallops RESP: Normal chest excursion without splinting or tachypnea; breath sounds clear and equal bilaterally; no wheezes, no rhonchi, no rales, no hypoxia or respiratory distress, speaking full sentences ABD/GI: Normal bowel sounds; non-distended; soft, non-tender, no rebound, no guarding, no peritoneal signs BACK: The back appears normal EXT: Normal ROM in all joints; no deformity noted, trace symmetric edema in bilateral lower extremities that is nonpitting.  She has 2+ DP pulses bilaterally.  Extremities warm and well-perfused without discoloration.  No calf tenderness or Swelling.  Compartments soft.  No joint effusions.  No bony abnormality or bony deformity. SKIN: Normal color for age and race; warm; no rash on exposed skin NEURO: Moves all extremities equally, normal speech PSYCH: The patient's mood and manner are appropriate.   ED Results / Procedures / Treatments   LABS: (all labs ordered are listed, but only abnormal results are displayed) Labs Reviewed  BRAIN NATRIURETIC PEPTIDE - Abnormal; Notable for the following components:      Result Value   B Natriuretic Peptide 119.4 (*)    All  other components within normal limits  CBC WITH DIFFERENTIAL/PLATELET - Abnormal; Notable for the following components:   RBC 3.82 (*)    Hemoglobin 10.2 (*)    HCT 31.7 (*)    All other components within normal limits  COMPREHENSIVE METABOLIC PANEL - Abnormal; Notable for the following components:   Glucose, Bld 118 (*)    Creatinine, Ser 1.10 (*)    GFR, Estimated 49 (*)    All other components within normal limits  URINALYSIS, ROUTINE W REFLEX MICROSCOPIC - Abnormal; Notable for the following components:   Color, Urine YELLOW (*)    APPearance CLEAR (*)    Leukocytes,Ua TRACE (*)  Bacteria, UA RARE (*)    All other components within normal limits     EKG:   RADIOLOGY: My personal review and interpretation of imaging: Venous Doppler of the left lower extremity shows no DVT.  I have personally reviewed all radiology reports.   US Venous Img Lower Unilateral Left  Result Date: 04/27/2022 CLINICAL DATA:  Leg swelling. EXAM: LEFT LOWER EXTREMITY VENOUS DOPPLER ULTRASOUND TECHNIQUE: Gray-scale sonography with graded compression, as well as color Doppler and duplex ultrasound were performed to evaluate the lower extremity deep venous systems from the level of the common femoral vein and including the common femoral, femoral, profunda femoral, popliteal and calf veins including the posterior tibial, peroneal and gastrocnemius veins when visible. The superficial great saphenous vein was also interrogated. Spectral Doppler was utilized to evaluate flow at rest and with distal augmentation maneuvers in the common femoral, femoral and popliteal veins. COMPARISON:  05/12/2021 FINDINGS: Contralateral Common Femoral Vein: Respiratory phasicity is normal and symmetric with the symptomatic side. No evidence of thrombus. Normal compressibility. Common Femoral Vein: No evidence of thrombus. Normal compressibility, respiratory phasicity and response to augmentation. Saphenofemoral Junction: No evidence  of thrombus. Normal compressibility and flow on color Doppler imaging. Profunda Femoral Vein: No evidence of thrombus. Normal compressibility and flow on color Doppler imaging. Femoral Vein: No evidence of thrombus. Normal compressibility, respiratory phasicity and response to augmentation. Popliteal Vein: No evidence of thrombus. Normal compressibility, respiratory phasicity and response to augmentation. Calf Veins: Visualized left deep calf veins are patent without thrombus. Other Findings:  None. IMPRESSION: Negative for deep venous thrombosis in left lower extremity. Electronically Signed   By: Richarda Overlie M.D.   On: 04/27/2022 16:20     PROCEDURES:  Critical Care performed: No     .1-3 Lead EKG Interpretation  Performed by: Carlin Attridge, Layla Maw, DO Authorized by: Elfreida Heggs, Layla Maw, DO     Interpretation: normal     ECG rate:  89   ECG rate assessment: normal     Rhythm: sinus rhythm     Ectopy: none     Conduction: normal       IMPRESSION / MDM / ASSESSMENT AND PLAN / ED COURSE  I reviewed the triage vital signs and the nursing notes.    Patient here with peripheral edema.  States her left leg feels "tight".  The patient is on the cardiac monitor to evaluate for evidence of arrhythmia and/or significant heart rate changes.   DIFFERENTIAL DIAGNOSIS (includes but not limited to):   Peripheral edema, CHF, PE, no sign of arterial obstruction, compartment syndrome, cellulitis, septic arthritis, gout, fracture, dislocation   Patient's presentation is most consistent with acute presentation with potential threat to life or bodily function.   PLAN: We will obtain CBC, BMP, BNP, venous Doppler of the left lower extremity.   MEDICATIONS GIVEN IN ED: Medications - No data to display   ED COURSE: Patient's labs show stable anemia.  No leukocytosis.  Slightly elevated creatinine which is also stable compared to previous.  Urine shows no proteinuria, hematuria.  BNP only minimally  elevated at 119.  She has no chest pain or shortness of breath.  Venous Doppler of the left lower extremity reviewed and interpreted by myself and the radiologist and shows no DVT.  Recommend decrease sodium intake, elevation of her legs at rest and compression hose.  I do not feel she needs diuresis.  I feel she is safe for further outpatient management.   CONSULTS: No admission needed  at this time.  Patient has minimal edema in her bilateral lower extremities that is symmetric.  She is neurovascular intact distally.  Her work-up here has been reassuring and she is not having chest pain or shortness of breath.  She is appropriate for further outpatient management.   OUTSIDE RECORDS REVIEWED: Reviewed patient's last admission in August 2022 for vertigo.  Last echocardiogram at that time showed an EF of 60 to 65% with no wall motion abnormalities.  She did have some mild grade 1 diastolic dysfunction.       FINAL CLINICAL IMPRESSION(S) / ED DIAGNOSES   Final diagnoses:  Peripheral edema     Rx / DC Orders   ED Discharge Orders     None        Note:  This document was prepared using Dragon voice recognition software and may include unintentional dictation errors.   Tinika Bucknam, Layla Maw, DO 04/28/22 [phone]

## 2022-04-27 NOTE — ED Triage Notes (Addendum)
Patient to ED for left leg swelling. Patient states left leg swelling started this AM and is painful. Ambulatory with cane. Patient stating numbness on left leg as well that started when she woke up this AM. Patient able to feel this RN touch leg but states it does not feel the same as the right leg. No weakness noted.

## 2022-05-27 ENCOUNTER — Ambulatory Visit: Payer: Medicare Other | Admitting: Podiatry

## 2022-11-02 ENCOUNTER — Encounter: Payer: Self-pay | Admitting: Podiatry

## 2022-11-02 ENCOUNTER — Ambulatory Visit (INDEPENDENT_AMBULATORY_CARE_PROVIDER_SITE_OTHER): Payer: 59 | Admitting: Podiatry

## 2022-11-02 DIAGNOSIS — M79676 Pain in unspecified toe(s): Secondary | ICD-10-CM | POA: Diagnosis not present

## 2022-11-02 DIAGNOSIS — E0843 Diabetes mellitus due to underlying condition with diabetic autonomic (poly)neuropathy: Secondary | ICD-10-CM

## 2022-11-02 DIAGNOSIS — B351 Tinea unguium: Secondary | ICD-10-CM

## 2022-11-02 NOTE — Progress Notes (Signed)
This patient returns to my office for at risk foot care.  This patient requires this care by a professional since this patient will be at risk due to having diabetes and neuropathy.  This patient is unable to cut nails herself since the patient cannot reach her nails.These nails are painful walking and wearing shoes.  This patient presents for at risk foot care today.  General Appearance  Alert, conversant and in no acute stress.  Vascular  Dorsalis pedis and posterior tibial  pulses are weakly  palpable  bilaterally.  Capillary return is within normal limits  bilaterally. Temperature is within normal limits  bilaterally.  Neurologic  Senn-Weinstein monofilament wire test within normal limits  bilaterally. Muscle power within normal limits bilaterally.  Nails Thick disfigured discolored nails with subungual debris  from hallux to fifth toes bilaterally.Pincer nails hallux  B/L. No evidence of bacterial infection or drainage bilaterally.  Orthopedic  No limitations of motion  feet .  No crepitus or effusions noted.  No bony pathology or digital deformities noted. HAV with hammer toes 2  B/L.  Skin  normotropic skin with no porokeratosis noted bilaterally.  No signs of infections or ulcers noted.     Onychomycosis  Pain in right toes  Pain in left toes  Consent was obtained for treatment procedures.   Mechanical debridement of nails 1-5  bilaterally performed with a nail nipper.  Filed with dremel without incident.    Return office visit   4 months                     Told patient to return for periodic foot care and evaluation due to potential at risk complications.   Gardiner Barefoot DPM

## 2023-02-22 ENCOUNTER — Encounter: Payer: Self-pay | Admitting: Podiatry

## 2023-03-03 ENCOUNTER — Ambulatory Visit: Payer: 59 | Admitting: Podiatry

## 2023-05-01 ENCOUNTER — Other Ambulatory Visit: Payer: Self-pay

## 2023-05-01 ENCOUNTER — Encounter: Payer: Self-pay | Admitting: Emergency Medicine

## 2023-05-01 ENCOUNTER — Emergency Department
Admission: EM | Admit: 2023-05-01 | Discharge: 2023-05-01 | Disposition: A | Discharge status: Home / Self Care | Payer: 59 | Attending: Emergency Medicine | Admitting: Emergency Medicine

## 2023-05-01 ENCOUNTER — Emergency Department: Payer: 59

## 2023-05-01 DIAGNOSIS — W010XXA Fall on same level from slipping, tripping and stumbling without subsequent striking against object, initial encounter: Secondary | ICD-10-CM | POA: Insufficient documentation

## 2023-05-01 DIAGNOSIS — Y92002 Bathroom of unspecified non-institutional (private) residence single-family (private) house as the place of occurrence of the external cause: Secondary | ICD-10-CM | POA: Insufficient documentation

## 2023-05-01 DIAGNOSIS — Z23 Encounter for immunization: Secondary | ICD-10-CM | POA: Diagnosis not present

## 2023-05-01 DIAGNOSIS — W19XXXA Unspecified fall, initial encounter: Secondary | ICD-10-CM

## 2023-05-01 DIAGNOSIS — S0083XA Contusion of other part of head, initial encounter: Secondary | ICD-10-CM

## 2023-05-01 DIAGNOSIS — S0181XA Laceration without foreign body of other part of head, initial encounter: Secondary | ICD-10-CM | POA: Diagnosis not present

## 2023-05-01 HISTORY — DX: Dementia in other diseases classified elsewhere, unspecified severity, without behavioral disturbance, psychotic disturbance, mood disturbance, and anxiety: F02.80

## 2023-05-01 MED ORDER — LIDOCAINE HCL (PF) 1 % IJ SOLN
5.0000 mL | Freq: Once | INTRAMUSCULAR | Status: AC
  Administered 2023-05-01: 5 mL
  Filled 2023-05-01: qty 5

## 2023-05-01 MED ORDER — ACETAMINOPHEN 325 MG PO TABS: 650.0000 mg | ORAL_TABLET | Freq: Once | ORAL | Status: AC

## 2023-05-01 MED ORDER — ACETAMINOPHEN 325 MG PO TABS
ORAL_TABLET | ORAL | Status: AC
  Administered 2023-05-01: 650 mg via ORAL
  Filled 2023-05-01: qty 2

## 2023-05-01 MED ORDER — LIDOCAINE-EPINEPHRINE-TETRACAINE (LET) TOPICAL GEL
3.0000 mL | Freq: Once | TOPICAL | Status: AC
  Administered 2023-05-01: 3 mL via TOPICAL
  Filled 2023-05-01: qty 3

## 2023-05-01 MED ORDER — TETANUS-DIPHTH-ACELL PERTUSSIS 5-2.5-18.5 LF-MCG/0.5 IM SUSY
0.5000 mL | PREFILLED_SYRINGE | Freq: Once | INTRAMUSCULAR | Status: AC
  Administered 2023-05-01: 0.5 mL via INTRAMUSCULAR
  Filled 2023-05-01: qty 0.5

## 2023-05-01 NOTE — ED Notes (Signed)
See triage note  Presents s/p fall  States she is not sure what she hit  Laceration noted to forehead

## 2023-05-01 NOTE — ED Provider Notes (Signed)
[facility] Provider Note    Event Date/Time   First MD Initiated Contact with Patient 05/01/23 [phone]     (approximate)   History   Fall   HPI  Connie Frederick is a 89 y.o. female   presents to the ED via [facility] from home after mechanical fall that occurred in the bathroom.  Patient states that she tripped and daughter is present states that they were getting ready to go to church.  There was no loss of consciousness.  Patient does have a large laceration to her forehead without active bleeding at this time.  Patient is talkative, alert and answers questions.  Patient has a history of diabetes, CVA, dementia.      Physical Exam   Triage Vital Signs: ED Triage Vitals  Encounter Vitals Group     BP 05/01/23 0920 138/78     Systolic BP Percentile --      Diastolic BP Percentile --      Pulse Rate 05/01/23 0920 73     Resp 05/01/23 0920 18     Temp 05/01/23 0920 98 F (36.7 C)     Temp Source 05/01/23 0920 Oral     SpO2 05/01/23 0920 99 %     Weight 05/01/23 0919 160 lb 0.9 oz (72.6 kg)     Height 05/01/23 0919 5' (1.524 m)     Head Circumference --      Peak Flow --      Pain Score 05/01/23 0919 0     Pain Loc --      Pain Education --      Exclude from Growth Chart --     Most recent vital signs: Vitals:   05/01/23 0920  BP: 138/78  Pulse: 73  Resp: 18  Temp: 98 F (36.7 C)  SpO2: 99%     General: Awake, no distress.  Alert, pleasant, talkative. CV:  Good peripheral perfusion.  Heart regular rate and rhythm. Resp:  Normal effort.  Lungs are clear bilaterally.  No tenderness on palpation of the chest wall anteriorly or bilaterally ribs.   Abd:  No distention.  Soft, nontender. Other:  Patient is able move upper and lower extremities without any difficulty.  No point tenderness is noted on palpation of cervical, thoracic or lumbar spine.  Nontender anterior chest wall or bilateral ribs on palpation.  No pain with compression  of the hips/pelvis bilaterally.  No shortening or rotation of the lower extremity to suggest hip fracture.  There is a laceration measuring approximately 3.5 cm to the forehead without active bleeding.   ED Results / Procedures / Treatments   Labs (all labs ordered are listed, but only abnormal results are displayed) Labs Reviewed - No data to display    RADIOLOGY  CT head, cervical spine and maxillofacial without contrast per radiology is negative for acute intracranial changes or fractures.  Cervical spine degeneration C4-C5 per radiology.   PROCEDURES:  Critical Care performed:   Marland KitchenMarland KitchenLaceration Repair  Date/Time: 05/01/2023 11:55 AM  Performed by: Tommi Rumps, PA-C Authorized by: Tommi Rumps, PA-C   Consent:    Consent obtained:  Verbal   Consent given by:  Patient   Risks, benefits, and alternatives were discussed: yes     Risks discussed:  Pain, infection and poor cosmetic result Universal protocol:    Patient identity confirmed:  Verbally with patient Anesthesia:    Anesthesia method:  Topical application   Topical  anesthetic:  LET Laceration details:    Location:  Face   Face location:  Forehead   Length (cm):  3.5 Pre-procedure details:    Preparation:  Patient was prepped and draped in usual sterile fashion and imaging obtained to evaluate for foreign bodies Exploration:    Limited defect created (wound extended): no     Hemostasis achieved with:  Direct pressure   Imaging outcome: foreign body not noted     Contaminated: no   Treatment:    Area cleansed with:  Saline   Amount of cleaning:  Standard   Irrigation solution:  Sterile saline   Irrigation method:  Tap   Visualized foreign bodies/material removed: no     Debridement:  None   Undermining:  None   Layers/structures repaired:  Vernona Rieger:    Suture size:  6-0   Suture material:  Vicryl   Suture technique:  Simple interrupted   Number of sutures:  4 Skin repair:    Repair method:   Sutures   Suture material:  Prolene   Suture technique:  Simple interrupted   Number of sutures:  9 Repair type:    Repair type:  Intermediate Post-procedure details:    Dressing:  Non-adherent dressing   Procedure completion:  Tolerated    MEDICATIONS ORDERED IN ED: Medications  acetaminophen (TYLENOL) tablet 650 mg (650 mg Oral Given 05/01/23 1012)  lidocaine-EPINEPHrine-tetracaine (LET) topical gel (3 mLs Topical Given 05/01/23 1128)  Tdap (BOOSTRIX) injection 0.5 mL (0.5 mLs Intramuscular Given 05/01/23 1125)  lidocaine (PF) (XYLOCAINE) 1 % injection 5 mL (5 mLs Infiltration Given 05/01/23 1131)     IMPRESSION / MDM / ASSESSMENT AND PLAN / ED COURSE  I reviewed the triage vital signs and the nursing notes.   Differential diagnosis includes, but is not limited to, head injury, contusion, cervical fracture, subluxation, strain, facial fracture, laceration forehead.  89 year old female presents to the ED after a fall that occurred in her bathroom this morning while getting ready for church.  CT head was reassuring and patient and family was made aware that there was no intracranial bleeding or skull fracture.  Cervical spine and facial x-rays were negative for acute changes.  Laceration repair.  Family member was made aware how to take care of this and watch for any signs of infection.  Clean daily with mild soap and water.  She is encouraged to have sutures removed in 7 to 10 days either in the ED, PCP or urgent care.  Return to the emergency department sooner if any worsening of her symptoms or urgent concerns.      Patient's presentation is most consistent with acute presentation with potential threat to life or bodily function.  FINAL CLINICAL IMPRESSION(S) / ED DIAGNOSES   Final diagnoses:  Laceration of forehead, initial encounter  Contusion of face, initial encounter  Fall in home, initial encounter     Rx / DC Orders   ED Discharge Orders     None         Note:  This document was prepared using Dragon voice recognition software and may include unintentional dictation errors.   Tommi Rumps, PA-C 05/01/23 1443    Minna Antis, MD 05/01/23 1506

## 2023-05-01 NOTE — ED Triage Notes (Signed)
Patient to ED via [facility] from home after a mechanical fall in the bathroom. Patient has large laceration to forehead. Bleeding controlled. Denies blood thinners or LOC.

## 2023-05-01 NOTE — Discharge Instructions (Signed)
Follow-up with your primary care provider or return to the emergency department in 10 days for suture removal.  Watch the area for any signs of infection.  Clean daily with mild soap and water and allowed to dry completely before covering it.  Allow the area to get air is much as possible.  Area may be swollen and also bruise.  Tylenol if needed for body aches or pain.  If any signs of action then return to the emergency department, urgent care or your primary care provider.

## 2023-05-05 ENCOUNTER — Encounter: Payer: Self-pay | Admitting: Podiatry

## 2023-05-05 ENCOUNTER — Ambulatory Visit (INDEPENDENT_AMBULATORY_CARE_PROVIDER_SITE_OTHER): Payer: 59 | Admitting: Podiatry

## 2023-05-05 DIAGNOSIS — M2042 Other hammer toe(s) (acquired), left foot: Secondary | ICD-10-CM

## 2023-05-05 DIAGNOSIS — E1151 Type 2 diabetes mellitus with diabetic peripheral angiopathy without gangrene: Secondary | ICD-10-CM | POA: Diagnosis not present

## 2023-05-05 DIAGNOSIS — M2041 Other hammer toe(s) (acquired), right foot: Secondary | ICD-10-CM | POA: Diagnosis not present

## 2023-05-05 DIAGNOSIS — B351 Tinea unguium: Secondary | ICD-10-CM | POA: Diagnosis not present

## 2023-05-05 DIAGNOSIS — M79676 Pain in unspecified toe(s): Secondary | ICD-10-CM | POA: Diagnosis not present

## 2023-05-05 DIAGNOSIS — E119 Type 2 diabetes mellitus without complications: Secondary | ICD-10-CM

## 2023-05-05 NOTE — Patient Instructions (Signed)
Diabetes Mellitus and Foot Care Diabetes, also called diabetes mellitus, may cause problems with your feet and legs because of poor blood flow (circulation). Poor circulation may make your skin: Become thinner and drier. Break more easily. Heal more slowly. Peel and crack. You may also have nerve damage (neuropathy). This can cause decreased feeling in your legs and feet. This means that you may not notice minor injuries to your feet that could lead to more serious problems. Finding and treating problems early is the best way to prevent future foot problems. How to care for your feet Foot hygiene  Wash your feet daily with warm water and mild soap. Do not use hot water. Then, pat your feet and the areas between your toes until they are fully dry. Do not soak your feet. This can dry your skin. Trim your toenails straight across. Do not dig under them or around the cuticle. File the edges of your nails with an emery board or nail file. Apply a moisturizing lotion or petroleum jelly to the skin on your feet and to dry, brittle toenails. Use lotion that does not contain alcohol and is unscented. Do not apply lotion between your toes. Shoes and socks Wear clean socks or stockings every day. Make sure they are not too tight. Do not wear knee-high stockings. These may decrease blood flow to your legs. Wear shoes that fit well and have enough cushioning. Always look in your shoes before you put them on to be sure there are no objects inside. To break in new shoes, wear them for just a few hours a day. This prevents injuries on your feet. Wounds, scrapes, corns, and calluses  Check your feet daily for blisters, cuts, bruises, sores, and redness. If you cannot see the bottom of your feet, use a mirror or ask someone for help. Do not cut off corns or calluses or try to remove them with medicine. If you find a minor scrape, cut, or break in the skin on your feet, keep it and the skin around it clean and  dry. You may clean these areas with mild soap and water. Do not clean the area with peroxide, alcohol, or iodine. If you have a wound, scrape, corn, or callus on your foot, look at it several times a day to make sure it is healing and not infected. Check for: Redness, swelling, or pain. Fluid or blood. Warmth. Pus or a bad smell. General tips Do not cross your legs. This may decrease blood flow to your feet. Do not use heating pads or hot water bottles on your feet. They may burn your skin. If you have lost feeling in your feet or legs, you may not know this is happening until it is too late. Protect your feet from hot and cold by wearing shoes, such as at the beach or on hot pavement. Schedule a complete foot exam at least once a year or more often if you have foot problems. Report any cuts, sores, or bruises to your health care provider right away. Where to find more information American Diabetes Association: diabetes.org Association of Diabetes Care & Education Specialists: diabeteseducator.org Contact a health care provider if: You have a condition that increases your risk of infection, and you have any cuts, sores, or bruises on your feet. You have an injury that is not healing. You have redness on your legs or feet. You feel burning or tingling in your legs or feet. You have pain or cramps in your legs  and feet. Your legs or feet are numb. Your feet always feel cold. You have pain around any toenails. Get help right away if: You have a wound, scrape, corn, or callus on your foot and: You have signs of infection. You have a fever. You have a red line going up your leg. This information is not intended to replace advice given to you by your health care provider. Make sure you discuss any questions you have with your health care provider. Document Revised: 03/10/2022 Document Reviewed: 03/10/2022 Elsevier Patient Education  2024 [employer]vier Inc[employer]  [place] Toe Hammer toe is a change in  the shape, or a deformity, of the toe. The deformity causes the middle joint of the toe to stay bent. Hammer toe starts gradually. At first, the toe can be straightened. Then over time, the toe deformity becomes stiff, inflexible, and permanently bent. Hammer toe usually affects the second, third, or fourth toe. A hammer toe causes pain, especially when wearing shoes. Corns and calluses can result from the toe rubbing against the inside of the shoe. Early treatments to keep the toe straight may relieve pain. As the deformity of the toe becomes stiff and permanent, surgery may be needed to straighten the toe. What are the causes? This condition is caused by abnormal bending of the toe joint that is closest to your foot. Over time, the toe bending downward pulls on the muscles and connections (tendons) of the toe joint, making them weak and stiff. Wearing shoes that are too narrow in the toe box and do not allow toes to fully straighten can cause this condition. What increases the risk? You are more likely to develop this condition if you: Are an older female. Wear shoes that are too small, or wear high-heeled shoes that pinch your toes. Have a second toe that is longer than your big toe (first toe). Injure your foot or toe. Have arthritis, or have a nerve or muscle disorder. Have diabetes or a condition known as Charcot joint, which may cause you to walk abnormally. Have a family history of hammer toe. Are a Advertising account planner. What are the signs or symptoms? Pain and deformity of the toe are the main symptoms of this condition. The pain is worse when wearing shoes, walking, or running. Other symptoms may include: A thickened patch of skin, called a corn or callus, that forms over the top of the bent part of the toe or between the toes. Redness and a burning feeling on the bent toe. An open sore that forms on the top of the bent toe. Not being able to straighten the affected toe. How is this  diagnosed? This condition is diagnosed based on your symptoms and a physical exam. During the exam, your health care provider will try to straighten your toe to see how stiff the deformity is. You may also have tests, such as: A blood test to check for rheumatoid arthritis or diabetes. An X-ray to show how severe the toe deformity is. How is this treated? Treatment for this condition depends on whether the toe is flexible or deformed and no longer moveable. In less severe cases, a hammer toe can be straightened without surgery. These treatments include: Taping the toe into a straightened position. Using pads and cushions to protect the bent toe. Wearing shoes that provide enough room for the toes. Doing toe-stretching exercises at home. Taking an NSAID, such as ibuprofen, to reduce pain and swelling. Using special orthotics or insoles for pain relief and  to improve walking. If these treatments do not help or the toe has a severe deformity and cannot be straightened, surgery is the next option. The most common surgeries used to straighten a hammer toe include: Arthroplasty or osteotomy. Part of the toe joint is reconstructed or removed, which allows the toe to straighten. Fusion. Cartilage between the two bones of the joint is taken out, and the bones are fused together into one longer bone. Implantation. Part of the bone is removed and replaced with an implant to allow the toe to move again. Flexor tendon transfer. The tendons that curl the toes down (flexor tendons) are repositioned. Follow these instructions at home: Take over-the-counter and prescription medicines only as told by your health care provider. Do toe-straightening and stretching exercises as told by your health care provider. Keep all follow-up visits. This is important. How is this prevented? Wear shoes that fit properly and give your toes enough room. Shoes should not cause pain. Buy shoes at the end of the day to make sure  they fit well, since your foot may swell during the day. Make sure they are comfortable before you buy them. As you age, your shoe size might change, including the width. Measure both feet and buy shoes for the larger foot. A shoe repair store might be able to stretch shoes that feel tight in spots. Do not wear high-heeled shoes or shoes with pointed toes. Contact a health care provider if: Your pain gets worse. Your toe becomes red or swollen. You develop an open sore on your toe. Summary Hammer toe is a condition that gradually causes your toe to become bent and stiff. Hammer toe can be treated by taping the toe into a straightened position and doing toe-stretching exercises. If these treatments do not help, surgery may be needed. To prevent this condition, wear shoes that fit properly, give your toes enough room, and do not cause pain. This information is not intended to replace advice given to you by your health care provider. Make sure you discuss any questions you have with your health care provider. Document Revised: 12/13/2019 Document Reviewed: 12/13/2019 Elsevier Patient Education  2024 [employer].

## 2023-05-05 NOTE — Progress Notes (Signed)
ANNUAL DIABETIC FOOT EXAM  Subjective: Connie Frederick presents today annual diabetic foot exam. She is accompanied by her daughter on today's visit. Daughter is requesting new diabetic shoes. States she has spoken to [name]'s PCP and was instructed to discuss with our office. Chief Complaint  Patient presents with   Nail Problem    DFC,A1C:6.1,BS:no sticks,diet controlled, no diabetes meds,LOV:08/24,pcp: [name]   Patient confirms h/o diabetes.  Patient denies any h/o foot wounds.  Patient denies any numbness, tingling, burning, or pins/needle sensation in feet.  Risk factors: diabetes, chronic lower extremity edema, h/o CVA, h/o MI, HTN, hyperlipidemia.  [name], MD is patient's PCP.  Past Medical History:  Diagnosis Date   Alzheimer disease (HCC)    Diabetes mellitus without complication (HCC)    Hypertension    Myocardial infarction (HCC)    Vertigo    HOSPITALIZED 2 WEEKS AGO   Patient Active Problem List   Diagnosis Date Noted   CVA (cerebral vascular accident) (HCC) 05/13/2021   Dementia (HCC) 05/13/2021   Dyslipidemia 05/13/2021   Lower extremity edema    Weakness 05/12/2021   Vertigo 05/14/2018   Varicose veins of leg with pain, bilateral 03/29/2017   Swelling of limb 03/29/2017   Hyperlipidemia 02/22/2017   Diabetes (HCC) 02/22/2017   Pain and swelling of lower extremity 02/22/2017   Past Surgical History:  Procedure Laterality Date   ABDOMINAL HYSTERECTOMY     CATARACT EXTRACTION W/PHACO Left 06/08/2018   Procedure: CATARACT EXTRACTION PHACO AND INTRAOCULAR LENS PLACEMENT (IOC);  Surgeon: Lockie Mola, MD;  Location: [facility];  Service: Ophthalmology;  Laterality: Left;  [place] 04:54 AP% 14.4 CDE 13.2 Fluid pack lot # [id number]   CATARACT EXTRACTION W/PHACO Right 06/28/2018   Procedure: CATARACT EXTRACTION PHACO AND INTRAOCULAR LENS PLACEMENT (IOC) RIGHT DIABETIC;  Surgeon: Lockie Mola, MD;  Location[facility];  Service:  Ophthalmology;  Laterality: Right;  diabetic - oral meds   VEIN LIGATION     Current Outpatient Medications on File Prior to Visit  Medication Sig Dispense Refill   acetaminophen (TYLENOL) 650 MG CR tablet Take 650 mg by mouth every 8 (eight) hours as needed for pain.     aspirin EC 81 MG tablet Take 81 mg by mouth daily.     losartan (COZAAR) 100 MG tablet Take 100 mg by mouth daily.     rosuvastatin (CRESTOR) 5 MG tablet Take 5 mg by mouth daily.     amLODipine (NORVASC) 2.5 MG tablet Take 2.5 mg by mouth daily. (Patient not taking: Reported on 05/05/2023)     Cholecalciferol 25 MCG (1000 UT) tablet Take 1,000 Units by mouth daily. (Patient not taking: Reported on 05/05/2023)     Cyanocobalamin (VITAMIN B 12 PO) Take 1,000 mcg by mouth. (Patient not taking: Reported on 05/05/2023)     donepezil (ARICEPT) 5 MG tablet donepezil 5 mg tablet (Patient not taking: Reported on 05/05/2023)     glipiZIDE (GLUCOTROL XL) 2.5 MG 24 hr tablet Take 2.5 mg by mouth daily. (Patient not taking: Reported on 05/05/2023)     hydrochlorothiazide (MICROZIDE) 12.5 MG capsule Take 12.5 mg by mouth daily. (Patient not taking: Reported on 05/05/2023)     meclizine (ANTIVERT) 25 MG tablet Take 1 tablet (25 mg total) by mouth 3 (three) times daily as needed for dizziness. (Patient not taking: Reported on 05/05/2023) 10 tablet 0   ondansetron (ZOFRAN ODT) 4 MG disintegrating tablet Take 1 tablet (4 mg total) by mouth every 8 (eight)  hours as needed for nausea or vomiting. (Patient not taking: Reported on 05/05/2023) 10 tablet 0   polyethylene glycol powder (GLYCOLAX/MIRALAX) powder Take 17 g by mouth daily as needed for mild constipation.  (Patient not taking: Reported on 05/05/2023)     promethazine (PHENERGAN) 12.5 MG tablet Take 1 tablet (12.5 mg total) by mouth every 8 (eight) hours as needed for nausea or vomiting. (Patient not taking: Reported on 05/05/2023) 30 tablet 0   vitamin B-12 (CYANOCOBALAMIN) 1000 MCG tablet Take 1,000  mcg by mouth daily. (Patient not taking: Reported on 05/05/2023)     No current facility-administered medications on file prior to visit.    No Known Allergies Social History   Occupational History   Not on file  Tobacco Use   Smoking status: Never   Smokeless tobacco: Never  Vaping Use   Vaping status: Never Used  Substance and Sexual Activity   Alcohol use: No   Drug use: No   Sexual activity: Not on file   Family History  Problem Relation Age of Onset   Hypertension Brother    Immunization History  Administered Date(s) Administered   Tdap 05/01/2023     Review of Systems: Negative except as noted in the HPI.   Objective: There were no vitals filed for this visit.  Connie Frederick is a pleasant 89 y.o. female in NAD. AAO X 3.  Vascular Examination: CFT <3 seconds b/l. DP pulses faintly palpable b/l. PT pulses nonpalpable b/l. Digital hair absent. Skin temperature gradient warm to warm b/l. No pain with calf compression. No ischemia or gangrene. No cyanosis or clubbing noted b/l. Trace edema noted both feet and bilateral ankles.   Neurological Examination: Sensation grossly intact b/l with 10 gram monofilament. Vibratory sensation intact b/l. Protective sensation intact 5/5 intact bilaterally with 10g monofilament b/l. Vibratory sensation intact b/l.  Dermatological Examination: Pedal skin warm and supple b/l. No open wounds b/l. No interdigital macerations. Toenails 2-5 b/l thick, discolored, elongated with subungual debris and pain on dorsal palpation.  Pincer nail deformity bilateral great toes. No erythema, no edema, no drainage, no fluctuance. Nail border hypertrophy absent.  Sign(s) of infection: no clinical signs of infection noted on examination today.. No corns, calluses nor porokeratotic lesions noted.  Musculoskeletal Examination: Muscle strength 5/5 to all lower extremity muscle groups bilaterally. Hammertoe deformity noted 2-5 b/l. Utilizes walker for  ambulation assistance.  Radiographs: None  Lab Results  Component Value Date   HGBA1C 6.1 (H) 05/12/2021   ADA Risk Categorization: High Risk  Patient has one or more of the following: Loss of protective sensation Absent pedal pulses Severe Foot deformity History of foot ulcer  Assessment: 1. Pain due to onychomycosis of toenail   2. Acquired hammertoes of both feet   3. Type II diabetes mellitus with peripheral circulatory disorder (HCC)   4. Encounter for diabetic foot exam (HCC)     Plan: Orders Placed This Encounter  Procedures   For Home Use Only DME Diabetic Shoe    Dispense one pair stretchable extra depth shoes and 3 pair total contact insoles.   FOR HOME USE ONLY DME DIABETIC SHOE  -Patient's family member present. All questions/concerns addressed on today's visit. -Diabetic foot examination performed today. -Continue diabetic foot care principles: inspect feet daily, monitor glucose as recommended by PCP and/or Endocrinologist, and follow prescribed diet per PCP, Endocrinologist and/or dietician. -Patient to continue soft, supportive shoe gear daily. -Patient to schedule appointment with Pedorthist for diabetic shoe measurements. -Discussed  diabetic shoe benefit available based on patient's diagnoses. Patient/POA would like to proceed. Order entered for one pair extra depth shoes and 3 pair total contact insoles. Patient qualifies based on diagnoses. -Toenails 1-5 b/l were debrided in length and girth with sterile nail nippers and dremel without iatrogenic bleeding.  -Patient/POA to call should there be question/concern in the interim. Return in about 4 months (around 09/04/2023).  Freddie Breech, DPM

## 2023-05-13 ENCOUNTER — Ambulatory Visit: Payer: 59

## 2023-05-13 NOTE — Progress Notes (Signed)
Patient presents to the office today for diabetic shoe and insole measuring.  Patient was measured with brannock device to determine size and width for 1 pair of extra depth shoes and foam casted for 3 pair of insoles.   Documentation of medical necessity will be sent to patient's treating diabetic doctor to verify and sign.   Patient's diabetic provider: [name] / Dr Eloy [facility]   Shoes and insoles will be ordered at that time and patient will be notified for an appointment for fitting when they arrive.   Shoe size (per patient): 9.5 Brannock measurement: 9 Patient shoe selection- [id number] Shoe choice:   9.5W Shoe size ordered: 9.5W ABN and financial form signed   Addison Bailey Cped, CFo, CFm

## 2023-07-08 ENCOUNTER — Ambulatory Visit: Payer: 59

## 2023-07-08 NOTE — Progress Notes (Signed)

## 2023-08-02 ENCOUNTER — Inpatient Hospital Stay: Payer: 59

## 2023-08-02 ENCOUNTER — Other Ambulatory Visit: Payer: Self-pay

## 2023-08-02 ENCOUNTER — Observation Stay
Admission: EM | Admit: 2023-08-02 | Discharge: 2023-08-03 | Disposition: A | Discharge status: Home / Self Care | Payer: 59 | Attending: Internal Medicine | Admitting: Internal Medicine

## 2023-08-02 ENCOUNTER — Emergency Department: Payer: 59

## 2023-08-02 DIAGNOSIS — N182 Chronic kidney disease, stage 2 (mild): Secondary | ICD-10-CM | POA: Diagnosis present

## 2023-08-02 DIAGNOSIS — I10 Essential (primary) hypertension: Secondary | ICD-10-CM | POA: Diagnosis present

## 2023-08-02 DIAGNOSIS — F0390 Unspecified dementia without behavioral disturbance: Secondary | ICD-10-CM | POA: Diagnosis present

## 2023-08-02 DIAGNOSIS — I480 Paroxysmal atrial fibrillation: Secondary | ICD-10-CM | POA: Diagnosis present

## 2023-08-02 DIAGNOSIS — G250 Essential tremor: Secondary | ICD-10-CM | POA: Diagnosis present

## 2023-08-02 DIAGNOSIS — R42 Dizziness and giddiness: Secondary | ICD-10-CM

## 2023-08-02 DIAGNOSIS — R4701 Aphasia: Secondary | ICD-10-CM | POA: Diagnosis present

## 2023-08-02 DIAGNOSIS — Z8673 Personal history of transient ischemic attack (TIA), and cerebral infarction without residual deficits: Secondary | ICD-10-CM

## 2023-08-02 DIAGNOSIS — R4781 Slurred speech: Principal | ICD-10-CM

## 2023-08-02 DIAGNOSIS — Z79899 Other long term (current) drug therapy: Secondary | ICD-10-CM | POA: Diagnosis not present

## 2023-08-02 DIAGNOSIS — I129 Hypertensive chronic kidney disease with stage 1 through stage 4 chronic kidney disease, or unspecified chronic kidney disease: Secondary | ICD-10-CM | POA: Insufficient documentation

## 2023-08-02 DIAGNOSIS — Z7982 Long term (current) use of aspirin: Secondary | ICD-10-CM | POA: Insufficient documentation

## 2023-08-02 DIAGNOSIS — I2510 Atherosclerotic heart disease of native coronary artery without angina pectoris: Secondary | ICD-10-CM | POA: Insufficient documentation

## 2023-08-02 LAB — TYPE AND SCREEN
ABO/RH(D): O POS
Antibody Screen: NEGATIVE

## 2023-08-02 LAB — CBC
HCT: 34 % — ABNORMAL LOW (ref 36.0–46.0)
Hemoglobin: 10.6 g/dL — ABNORMAL LOW (ref 12.0–15.0)
MCH: 27.2 pg (ref 26.0–34.0)
MCHC: 31.2 g/dL (ref 30.0–36.0)
MCV: 87.2 fL (ref 80.0–100.0)
Platelets: 178 10*3/uL (ref 150–400)
RBC: 3.9 MIL/uL (ref 3.87–5.11)
RDW: 14.3 % (ref 11.5–15.5)
WBC: 5.1 10*3/uL (ref 4.0–10.5)
nRBC: 0 % (ref 0.0–0.2)

## 2023-08-02 LAB — MAGNESIUM: Magnesium: 2 mg/dL (ref 1.7–2.4)

## 2023-08-02 LAB — BASIC METABOLIC PANEL
Anion gap: 7 (ref 5–15)
BUN: 20 mg/dL (ref 8–23)
CO2: 26 mmol/L (ref 22–32)
Calcium: 9.5 mg/dL (ref 8.9–10.3)
Chloride: 106 mmol/L (ref 98–111)
Creatinine, Ser: 1.14 mg/dL — ABNORMAL HIGH (ref 0.44–1.00)
GFR, Estimated: 46 mL/min — ABNORMAL LOW (ref >60)
Glucose, Bld: 94 mg/dL (ref 70–99)
Potassium: 3.7 mmol/L (ref 3.5–5.1)
Sodium: 139 mmol/L (ref 135–145)

## 2023-08-02 LAB — HEPATIC FUNCTION PANEL
ALT: 15 U/L (ref 0–44)
AST: 22 U/L (ref 15–41)
Albumin: 3.9 g/dL (ref 3.5–5.0)
Alkaline Phosphatase: 57 U/L (ref 38–126)
Bilirubin, Direct: 0.1 mg/dL (ref 0.0–0.2)
Indirect Bilirubin: 0.6 mg/dL (ref 0.3–0.9)
Total Bilirubin: 0.7 mg/dL (ref <1.2)
Total Protein: 6.9 g/dL (ref 6.5–8.1)

## 2023-08-02 LAB — CK: Total CK: 74 U/L (ref 38–234)

## 2023-08-02 LAB — TROPONIN I (HIGH SENSITIVITY): Troponin I (High Sensitivity): 9 ng/L (ref <18)

## 2023-08-02 LAB — T4, FREE: Free T4: 0.85 ng/dL (ref 0.61–1.12)

## 2023-08-02 LAB — TSH: TSH: 1.973 u[IU]/mL (ref 0.350–4.500)

## 2023-08-02 LAB — D-DIMER, QUANTITATIVE: D-Dimer, Quant: 1.14 ug{FEU}/mL — ABNORMAL HIGH (ref 0.00–0.50)

## 2023-08-02 MED ORDER — PANTOPRAZOLE SODIUM 40 MG IV SOLR
40.0000 mg | Freq: Two times a day (BID) | INTRAVENOUS | Status: DC
Start: 2023-08-02 — End: 2023-08-03
  Administered 2023-08-02 – 2023-08-03 (×2): 40 mg via INTRAVENOUS
  Filled 2023-08-02 (×2): qty 10

## 2023-08-02 MED ORDER — SODIUM CHLORIDE 0.9% FLUSH
10.0000 mL | Freq: Two times a day (BID) | INTRAVENOUS | Status: DC
Start: 2023-08-02 — End: 2023-08-03
  Administered 2023-08-02 – 2023-08-03 (×2): 10 mL via INTRAVENOUS

## 2023-08-02 MED ORDER — ACETAMINOPHEN 325 MG PO TABS: 650.0000 mg | ORAL_TABLET | ORAL | Status: DC | PRN

## 2023-08-02 MED ORDER — LORAZEPAM 0.5 MG PO TABS
0.5000 mg | ORAL_TABLET | Freq: Once | ORAL | Status: AC | PRN
  Administered 2023-08-02: 0.5 mg via ORAL
  Filled 2023-08-02: qty 1

## 2023-08-02 MED ORDER — ACETAMINOPHEN 160 MG/5ML PO SOLN: 650.0000 mg | ORAL | Status: DC | PRN

## 2023-08-02 MED ORDER — ASPIRIN 81 MG PO TBEC
81.0000 mg | DELAYED_RELEASE_TABLET | Freq: Every day | ORAL | Status: DC
  Administered 2023-08-02 – 2023-08-03 (×2): 81 mg via ORAL
  Filled 2023-08-02 (×2): qty 1

## 2023-08-02 MED ORDER — IOHEXOL 350 MG/ML SOLN
75.0000 mL | Freq: Once | INTRAVENOUS | Status: AC | PRN
  Administered 2023-08-02: 75 mL via INTRAVENOUS

## 2023-08-02 MED ORDER — HEPARIN SODIUM (PORCINE) 5000 UNIT/ML IJ SOLN
5000.0000 [IU] | Freq: Two times a day (BID) | INTRAMUSCULAR | Status: DC
  Administered 2023-08-02 – 2023-08-03 (×2): 5000 [IU] via SUBCUTANEOUS
  Filled 2023-08-02 (×2): qty 1

## 2023-08-02 MED ORDER — LORAZEPAM 0.5 MG PO TABS: 0.5000 mg | ORAL_TABLET | Freq: Once | ORAL | Status: DC

## 2023-08-02 MED ORDER — SODIUM CHLORIDE 0.9 % IV BOLUS
250.0000 mL | Freq: Once | INTRAVENOUS | Status: AC
  Administered 2023-08-02: 250 mL via INTRAVENOUS

## 2023-08-02 MED ORDER — ACETAMINOPHEN 650 MG RE SUPP: 650.0000 mg | RECTAL | Status: DC | PRN

## 2023-08-02 MED ORDER — GADOBUTROL 1 MMOL/ML IV SOLN
7.0000 mL | Freq: Once | INTRAVENOUS | Status: AC | PRN
  Administered 2023-08-02: 7 mL via INTRAVENOUS

## 2023-08-02 MED ORDER — STROKE: EARLY STAGES OF RECOVERY BOOK: Freq: Once | Status: DC

## 2023-08-02 MED ORDER — ATORVASTATIN CALCIUM 20 MG PO TABS
40.0000 mg | ORAL_TABLET | Freq: Every day | ORAL | Status: DC
  Administered 2023-08-02 – 2023-08-03 (×2): 40 mg via ORAL
  Filled 2023-08-02 (×2): qty 2

## 2023-08-02 NOTE — Assessment & Plan Note (Signed)
Difficult to obtain clear EKG / will cause issue with imaging if pt can't hold still and will need valium to get accurate MRI and MRA.  And cta . Low bp is barrier to sedation. Manual bp and we will follow.

## 2023-08-02 NOTE — Assessment & Plan Note (Signed)
Swallow eval. Cont aricept.

## 2023-08-02 NOTE — ED Triage Notes (Addendum)
Pt here via [facility] from home with dizziness and tremors that started 2 hours ago. Pt states the dizziness has subsided but the tremors are new. Stroke screen neg with ems. Daughter states pt has mild dementia. Vitals WNL. Pt denies pain, NVD.

## 2023-08-02 NOTE — H&P (Signed)
History and Physical    Patient: Connie Frederick NGE:[id number] DOB: 10-14-1932 DOA: 08/02/2023 DOS: the patient was seen and examined on 08/02/2023 PCP: Pcp, No  Patient coming from: Home  Chief Complaint: No chief complaint on file.   HPI: Connie Frederick is a 89 y.o. female with medical history significant for Past medical history of diabetes melitis, hypertension, vertigo, sinus bradycardia, mild CKD stage II, history of heart disease, glaucoma, dementia, coming to the emergency room for dizziness, being shaky all over.   Patient reportedly had speech difficulty and was aphasic for about 15 to 20 minutes after which her speech resumed and was normal    In emergency room vitals trend shows: Vitals:   08/02/23 1700 08/02/23 1900 08/02/23 1930 08/02/23 1948  BP: (!) 128/55 109/71 (!) 114/91 115/86  Pulse: (!) 49 (!) 43 (!) 40   Temp:    98.3 F (36.8 C)  Resp: (!) 22 16 17    Height:      Weight:      SpO2: 96% 98% 100%   TempSrc:    Oral  BMI (Calculated):      Initial EKG today shows A-fib patient has no history of A-fib with heart rate was 71 however due to patient's" shaking all over" EKG is erroneous and has significant artifact.   Labs are notable for : Basic metabolic panel shows a creatinine of 1.14 normal electrolytes EGFR 46 LFTs added and pending. CBC without differential showing stable hemoglobin of 10.6 patient does have a chronic history of anemia suspect anemia of chronic kidney disease, normal white count normal platelet count. Stat CT head noncontrast done in the emergency room for concerns of TIA, which was found to be negative for any acute intracranial abnormality however unchanged moderate chronic small vessel ischemic changes with  old infarcts in the right thalamus and right cerebellar hemisphere.  In the ED pt received: Medications  pantoprazole (PROTONIX) injection 40 mg (40 mg Intravenous Given 08/02/23 1853)   stroke: early stages of recovery book (has  no administration in time range)  acetaminophen (TYLENOL) tablet 650 mg (has no administration in time range)    Or  acetaminophen (TYLENOL) 160 MG/5ML solution 650 mg (has no administration in time range)    Or  acetaminophen (TYLENOL) suppository 650 mg (has no administration in time range)  sodium chloride flush (NS) 0.9 % injection 10 mL (has no administration in time range)  heparin injection 5,000 Units (5,000 Units Subcutaneous Given 08/02/23 1946)  aspirin EC tablet 81 mg (81 mg Oral Given 08/02/23 1946)  atorvastatin (LIPITOR) tablet 40 mg (40 mg Oral Given 08/02/23 1946)  sodium chloride 0.9 % bolus 250 mL (has no administration in time range)   Review of Systems  Musculoskeletal:  Negative for falls.  Neurological:  Positive for dizziness, tremors and speech change. Negative for focal weakness and weakness.  All other systems reviewed and are negative.  No past medical history on file. The histories are not reviewed yet. Please review them in the "History" navigator section and refresh this SmartLink.  has no history on file for tobacco use, alcohol use, and drug use.  No Known Allergies  No family history on file.  Prior to Admission medications   Not on File    Vitals:   08/02/23 1700 08/02/23 1900 08/02/23 1930 08/02/23 1948  BP: (!) 128/55 109/71 (!) 114/91 115/86  Pulse: (!) 49 (!) 43 (!) 40   Resp: (!) 22 16 17    Temp:  98.3 F (36.8 C)  TempSrc:    Oral  SpO2: 96% 98% 100%   Weight:      Height:       Physical Exam Vitals and nursing note reviewed.  Constitutional:      General: She is not in acute distress. HENT:     Head: Normocephalic and atraumatic.     Right Ear: Hearing normal.     Left Ear: Hearing normal.     Nose: Nose normal. No nasal deformity.     Mouth/Throat:     Lips: Pink.     Tongue: No lesions.     Pharynx: Oropharynx is clear.  Eyes:     General: Lids are normal.     Extraocular Movements: Extraocular movements intact.   Cardiovascular:     Rate and Rhythm: Normal rate and regular rhythm.     Heart sounds: Normal heart sounds.  Pulmonary:     Effort: Pulmonary effort is normal.     Breath sounds: Normal breath sounds.  Abdominal:     General: Bowel sounds are normal. There is no distension.     Palpations: Abdomen is soft. There is no mass.     Tenderness: There is no abdominal tenderness.  Musculoskeletal:     Right lower leg: No edema.     Left lower leg: No edema.  Skin:    General: Skin is warm.  Neurological:     General: No focal deficit present.     Mental Status: She is alert and oriented to person, place, and time.     Cranial Nerves: Cranial nerves 2-12 are intact.  Psychiatric:        Attention and Perception: Attention normal.        Mood and Affect: Mood normal.        Speech: Speech normal.        Behavior: Behavior normal. Behavior is cooperative.      Labs on Admission: I have personally reviewed following labs and imaging studies Results for orders placed or performed during the hospital encounter of 08/02/23 (from the past 24 hour(s))  Basic metabolic panel     Status: Abnormal   Collection Time: 08/02/23  2:41 PM  Result Value Ref Range   Sodium 139 135 - 145 mmol/L   Potassium 3.7 3.5 - 5.1 mmol/L   Chloride 106 98 - 111 mmol/L   CO2 26 22 - 32 mmol/L   Glucose, Bld 94 70 - 99 mg/dL   BUN 20 8 - 23 mg/dL   Creatinine, Ser 1.61 (H) 0.44 - 1.00 mg/dL   Calcium 9.5 8.9 - 09.6 mg/dL   GFR, Estimated 46 (L) >60 mL/min   Anion gap 7 5 - 15  CBC     Status: Abnormal   Collection Time: 08/02/23  2:41 PM  Result Value Ref Range   WBC 5.1 4.0 - 10.5 K/uL   RBC 3.90 3.87 - 5.11 MIL/uL   Hemoglobin 10.6 (L) 12.0 - 15.0 g/dL   HCT 04.5 (L) 40.9 - 81.1 %   MCV 87.2 80.0 - 100.0 fL   MCH 27.2 26.0 - 34.0 pg   MCHC 31.2 30.0 - 36.0 g/dL   RDW 91.4 78.2 - 95.6 %   Platelets 178 150 - 400 K/uL   nRBC 0.0 0.0 - 0.2 %  Type and screen     Status: None (Preliminary result)    Collection Time: 08/02/23  6:32 PM  Result Value Ref Range   ABO/RH(D)  PENDING    Antibody Screen PENDING    Sample Expiration      08/05/2023,2359 Performed at [facility], [address] [place], [place] [address]   Hepatic function panel     Status: None   Collection Time: 08/02/23  6:34 PM  Result Value Ref Range   Total Protein 6.9 6.5 - 8.1 g/dL   Albumin 3.9 3.5 - 5.0 g/dL   AST 22 15 - 41 U/L   ALT 15 0 - 44 U/L   Alkaline Phosphatase 57 38 - 126 U/L   Total Bilirubin 0.7 <1.2 mg/dL   Bilirubin, Direct 0.1 0.0 - 0.2 mg/dL   Indirect Bilirubin 0.6 0.3 - 0.9 mg/dL  Magnesium     Status: None   Collection Time: 08/02/23  6:34 PM  Result Value Ref Range   Magnesium 2.0 1.7 - 2.4 mg/dL  Troponin I (High Sensitivity)     Status: None   Collection Time: 08/02/23  6:34 PM  Result Value Ref Range   Troponin I (High Sensitivity) 9 <18 ng/L  T4, free     Status: None   Collection Time: 08/02/23  6:34 PM  Result Value Ref Range   Free T4 0.85 0.61 - 1.12 ng/dL  TSH     Status: None   Collection Time: 08/02/23  6:34 PM  Result Value Ref Range   TSH 1.973 0.350 - 4.500 uIU/mL  CK     Status: None   Collection Time: 08/02/23  6:34 PM  Result Value Ref Range   Total CK 74 38 - 234 U/L  D-dimer, quantitative     Status: Abnormal   Collection Time: 08/02/23  6:34 PM  Result Value Ref Range   D-Dimer, Quant 1.14 (H) 0.00 - 0.50 ug/mL-FEU   CBC: Recent Labs  Lab 08/02/23 1441  WBC 5.1  HGB 10.6*  HCT 34.0*  MCV 87.2  PLT 178   Basic Metabolic Panel: Recent Labs  Lab 08/02/23 1441 08/02/23 1834  NA 139  --   K 3.7  --   CL 106  --   CO2 26  --   GLUCOSE 94  --   BUN 20  --   CREATININE 1.14*  --   CALCIUM 9.5  --   MG  --  2.0   GFR: Estimated Creatinine Clearance: 30.3 mL/min (A) (by C-G formula based on SCr of 1.14 mg/dL (H)). Liver Function Tests: Recent Labs  Lab 08/02/23 1834  AST 22  ALT 15  ALKPHOS 57  BILITOT 0.7  PROT 6.9   ALBUMIN 3.9   No results for input(s): "LIPASE", "AMYLASE" in the last 168 hours. No results for input(s): "AMMONIA" in the last 168 hours. Coagulation Profile: No results for input(s): "INR", "PROTIME" in the last 168 hours. Cardiac Enzymes: Recent Labs  Lab 08/02/23 1834  CKTOTAL 74   BNP (last 3 results) No results for input(s): "PROBNP" in the last 8760 hours. HbA1C: No results for input(s): "HGBA1C" in the last 72 hours. CBG: No results for input(s): "GLUCAP" in the last 168 hours. Lipid Profile: No results for input(s): "CHOL", "HDL", "LDLCALC", "TRIG", "CHOLHDL", "LDLDIRECT" in the last 72 hours. Thyroid Function Tests: Recent Labs    08/02/23 1834  TSH 1.973  FREET4 0.85   Anemia Panel: No results for input(s): "VITAMINB12", "FOLATE", "FERRITIN", "TIBC", "IRON", "RETICCTPCT" in the last 72 hours. Urinalysis No results found for: "COLORURINE", "APPEARANCEUR", "LABSPEC", "PHURINE", "GLUCOSEU", "HGBUR", "BILIRUBINUR", "KETONESUR", "PROTEINUR", "UROBILINOGEN", "NITRITE", "LEUKOCYTESUR"  Unresulted Labs (From admission, onward)     Start     Ordered   08/03/23 0500  Lipid panel  (Labs)  Tomorrow morning,   R       Comments: Fasting    08/02/23 1925   08/02/23 1924  Urine rapid drug screen (hosp performed)not at [facility])  Once,   R        08/02/23 1925   08/02/23 1439  Urinalysis, Routine w reflex microscopic -Urine, Clean Catch  Once,   URGENT       Question:  Specimen Source  Answer:  Urine, Clean Catch   08/02/23 1439            Medications  pantoprazole (PROTONIX) injection 40 mg (40 mg Intravenous Given 08/02/23 1853)   stroke: early stages of recovery book (has no administration in time range)  acetaminophen (TYLENOL) tablet 650 mg (has no administration in time range)    Or  acetaminophen (TYLENOL) 160 MG/5ML solution 650 mg (has no administration in time range)    Or  acetaminophen (TYLENOL) suppository 650 mg (has no administration in  time range)  sodium chloride flush (NS) 0.9 % injection 10 mL (has no administration in time range)  heparin injection 5,000 Units (5,000 Units Subcutaneous Given 08/02/23 1946)  aspirin EC tablet 81 mg (81 mg Oral Given 08/02/23 1946)  atorvastatin (LIPITOR) tablet 40 mg (40 mg Oral Given 08/02/23 1946)  sodium chloride 0.9 % bolus 250 mL (has no administration in time range)    Radiological Exams on Admission: CT HEAD WO CONTRAST  Result Date: 08/02/2023 CLINICAL DATA:  Syncope/presyncope, cerebrovascular cause suspected. Dizziness and tremors. EXAM: CT HEAD WITHOUT CONTRAST TECHNIQUE: Contiguous axial images were obtained from the base of the skull through the vertex without intravenous contrast. RADIATION DOSE REDUCTION: This exam was performed according to the departmental dose-optimization program which includes automated exposure control, adjustment of the mA and/or kV according to patient size and/or use of iterative reconstruction technique. COMPARISON:  Head CT 05/01/2023. FINDINGS: Brain: No acute hemorrhage. Unchanged moderate chronic small-vessel disease with old perforator infarcts in the right thalamus and right cerebellar hemisphere. Cortical gray-white differentiation is otherwise preserved. Prominence of the ventricles and sulci within expected range for age. No hydrocephalus or extra-axial collection. No mass effect or midline shift. Vascular: No hyperdense vessel or unexpected calcification. Skull: No calvarial fracture or suspicious bone lesion. Skull base is unremarkable. Sinuses/Orbits: No acute finding. Other: None. IMPRESSION: 1. No acute intracranial abnormality. 2. Unchanged moderate chronic small-vessel disease with old perforator infarcts in the right thalamus and right cerebellar hemisphere. Electronically Signed   By: Orvan Falconer M.D.   On: 08/02/2023 15:09     Data Reviewed: Relevant notes from primary care and specialist visits, past discharge summaries as  available in EHR, including Care Everywhere. Prior diagnostic testing as pertinent to current admission diagnoses Updated medications and problem lists for reconciliation ED course, including vitals, labs, imaging, treatment and response to treatment Triage notes, nursing and pharmacy notes and ED provider's notes Notable results as noted in HPI  Assessment and Plan: * Aphasia Concerning for CVA in light of her h/o cva we will proceed with MRI and MRA and cont with asa 81 with statin therapy. BP is low we will do manual check, pt given saltines and ns 250 cc bolus.  Higher BP is needed for her cva and also for possible need to anti anxiety for MRI to avoid tremor for better clarity and imagine  on MRI.  It is not clear if this is another cva/tia/ pe or cardiac event.   Benign essential tremor Difficult to obtain clear EKG / will cause issue with imaging if pt can't hold still and will need valium to get accurate MRI and MRA.  And cta . Low bp is barrier to sedation. Manual bp and we will follow.    Dementia without behavioral disturbance (HCC) Swallow eval. Cont aricept.    Paroxysmal atrial fibrillation (HCC) New onset.  Suspect she has PAF and may have had it before and caused her earlier strokes.  We will currently obtain MRI head cta chest and decide on Asa 81 mg or anticoagulation or DAPT based on results of study.   HTN (hypertension), benign Vitals:   08/02/23 1434 08/02/23 1700 08/02/23 1900  BP: 133/65 (!) 128/55 109/71  We will hold losartan/ hydrochlorothiazide.    History of stroke Pt is nonfocal at baseline as well as today . Cont asa 81 mg/ statin.     DVT prophylaxis:  Heparin q12h.  Consults:  None   Advance Care Planning:    Code Status: Full Code   Family Communication:  Daughter shirley.  Disposition Plan:  Home   Severity of Illness: The appropriate patient status for this patient is INPATIENT. Inpatient status is judged to be reasonable and  necessary in order to provide the required intensity of service to ensure the patient's safety. The patient's presenting symptoms, physical exam findings, and initial radiographic and laboratory data in the context of their chronic comorbidities is felt to place them at high risk for further clinical deterioration. Furthermore, it is not anticipated that the patient will be medically stable for discharge from the hospital within 2 midnights of admission.   * I certify that at the point of admission it is my clinical judgment that the patient will require inpatient hospital care spanning beyond 2 midnights from the point of admission due to high intensity of service, high risk for further deterioration and high frequency of surveillance required.*  Author: Gertha Calkin, MD 08/02/2023 7:52 PM  For on call review [email or web address].  Orders Placed This Encounter  Procedures   CT HEAD WO CONTRAST    Place this order only IF head trauma is known/suspected and pt is taking anticoagulant.    Standing Status:   Standing    Number of Occurrences:   1    Order Specific Question:   Radiology Contrast Protocol - do NOT remove file path    Answer:   [email or web address]   MR BRAIN WO CONTRAST    Standing Status:   Standing    Number of Occurrences:   1    Order Specific Question:   What is the patient's sedation requirement?    Answer:   No Sedation    Order Specific Question:   Does the patient have a pacemaker or implanted devices?    Answer:   No   MR ANGIO NECK W WO CONTRAST    Standing Status:   Standing    Number of Occurrences:   1    Order Specific Question:   If indicated for the ordered procedure, I authorize the administration of contrast media per Radiology protocol    Answer:   Yes    Order Specific Question:   What is the patient's sedation requirement?    Answer:   No Sedation    Order Specific Question:   Does the patient have a pacemaker or implanted  devices?    Answer:   No   CT Angio Chest Pulmonary Embolism (PE) W or WO Contrast    Standing Status:   Standing    Number of Occurrences:   1    Order Specific Question:   Does the patient have a contrast media/X-ray dye allergy?    Answer:   No    Order Specific Question:   If indicated for the ordered procedure, I authorize the administration of contrast media per Radiology protocol    Answer:   Yes   Basic metabolic panel    Standing Status:   Standing    Number of Occurrences:   1   CBC    Standing Status:   Standing    Number of Occurrences:   1   Urinalysis, Routine w reflex microscopic -Urine, Clean Catch    Standing Status:   Standing    Number of Occurrences:   1    Order Specific Question:   Specimen Source    Answer:   Urine, Clean Catch [76]   Hepatic function panel    Standing Status:   Standing    Number of Occurrences:   1   Magnesium    Standing Status:   Standing    Number of Occurrences:   1   T4, free    Standing Status:   Standing    Number of Occurrences:   1   TSH    Standing Status:   Standing    Number of Occurrences:   1   CK    Standing Status:   Standing    Number of Occurrences:   1   D-dimer, quantitative    Standing Status:   Standing    Number of Occurrences:   1   Urine rapid drug screen (hosp performed)not at [facility]    Standing Status:   Standing    Number of Occurrences:   1   Lipid panel    Fasting    Standing Status:   Standing    Number of Occurrences:   1   Diet regular Room service appropriate? Yes; Fluid consistency: Thin    Standing Status:   Standing    Number of Occurrences:   1    Order Specific Question:   Room service appropriate?    Answer:   Yes    Order Specific Question:   Fluid consistency:    Answer:   Thin   Document Height and Actual Weight    Use scales to weigh patient, not stated or estimated weight.    Standing Status:   Standing    Number of Occurrences:   1   Swallow screen    Standing Status:    Standing    Number of Occurrences:   1   NIHSS score documentation NIHSS score range: 0-42    NIHSS score range: 0-42    Standing Status:   Standing    Number of Occurrences:   1   Vital signs    Standing Status:   Standing    Number of Occurrences:   1   Notify physician (specify)    Standing Status:   Standing    Number of Occurrences:   20    Order Specific Question:   Notify Physician    Answer:   change in neurologic status based on Neurologic Worsening protocol    Order Specific Question:   Notify Physician    Answer:   new onset dysrhythmia  Order Specific Question:   Notify Physician    Answer:   Systolic BP > 180 (not controlled by PRN continuous infusion medications) or less than 100    Order Specific Question:   Notify Physician    Answer:   Pox < 88% despite the use of supplemental O2 via nasal cannula    Order Specific Question:   Notify Physician    Answer:   Heart Rate > 120 or less than 50    Order Specific Question:   Notify Physician    Answer:   Respiratory Rate > 30    Order Specific Question:   Notify Physician    Answer:   Blood Glucose > 180mg /dl or less than 60mg /dl    Order Specific Question:   Notify Physician    Answer:   Temperature goal of 37 C (98.6 F). Acceptable range: 36.5 C - 37.5 C (97.7 F - 99.5 F)    Order Specific Question:   Notify Physician    Answer:   Urine output > 300 ml in 1 hour or less than 30 ml in 4 hours (unless anuric)   OOB with assistance    Standing Status:   Standing    Number of Occurrences:   [phone]   Activity as tolerated    Standing Status:   Standing    Number of Occurrences:   1   Swallow screen - If patient does NOT pass this screen, place order for SLP eval and treat (SLP2) - swallowing evaluation (BSE, MBS and/or diet order as indicated)    - If patient does NOT pass this screen, place order for SLP eval and treat (SLP2) - swallowing evaluation (BSE, MBS and/or diet order as indicated)    Standing Status:    Standing    Number of Occurrences:   1   NIH stroke scale    Document NIHSS on arrival to unit and each shift    Standing Status:   Standing    Number of Occurrences:   1   Intake and output    Avoid use of indwelling catheter    Standing Status:   Standing    Number of Occurrences:   1   Cardiac Monitoring Continuous x 24 hours Indications for use: Acute neurological event    Standing Status:   Standing    Number of Occurrences:   1    Order Specific Question:   Indications for use:    Answer:   Acute neurological event   Apply Stroke Care Plan: Ischemic Stroke, TIA    Standing Status:   Standing    Number of Occurrences:   1   Discuss with patient and document patient's goals for stroke risk factor reduction    Standing Status:   Standing    Number of Occurrences:   1   Initiate Oral Care Protocol    Standing Status:   Standing    Number of Occurrences:   1   Initiate Carrier Fluid Protocol    Standing Status:   Standing    Number of Occurrences:   1   Provide stroke education material to patient and family.    Standing Status:   Standing    Number of Occurrences:   1   Nurse to provide smoking / tobacco cessation education    Standing Status:   Standing    Number of Occurrences:   1   If the patient has passed the Stroke Swallow Screen or has a  feeding tube, then RN may order General Admission PRN Orders (through manage orders) for the following patient needs: allergy symptoms (Claritin), cold sores (Carmex), cough (Robitussin DM), eye irritation (Liquifilm Tears), hemorrhoids (Tucks), indigestion (Maalox), minor skin irritation (hydrocortisone cream), muscle pain Romeo Apple Gay), nose irritation (saline nasal spray) and sore throat (Chloraseptic spray).    Standing Status:   Standing    Number of Occurrences:   [phone]   Full code    Standing Status:   Standing    Number of Occurrences:   1    Order Specific Question:   By:    Answer:   Other   Consult to hospitalist    Standing  Status:   Standing    Number of Occurrences:   1    Order Specific Question:   Place call to:    Answer:   [id number]    Order Specific Question:   Reason for Consult    Answer:   Admit    Order Specific Question:   Diagnosis/Clinical Info for Consult:    Answer:   89 year old female presenting after episode of confusion with slurred speech, now resolved, moderate risk ABCD 2 score.   Consult to Registered Dietitian    Standing Status:   Standing    Number of Occurrences:   1    Order Specific Question:   Reason for consult?    Answer:   Assessment of nutrition requirements/status   Consult to Transition of Care Team    Standing Status:   Standing    Number of Occurrences:   1    Order Specific Question:   Reason for Consult:    Answer:   Home Health / DME Needs    Order Specific Question:   Reason for Consult:    Answer:   SNF placement   OT eval and treat    Standing Status:   Standing    Number of Occurrences:   1   PT eval and treat    Standing Status:   Standing    Number of Occurrences:   1   Oxygen therapy Mode or (Route): Nasal cannula; Liters Per Minute: 2; Keep 02 saturation: greater than 94 %    Standing Status:   Standing    Number of Occurrences:   1    Order Specific Question:   Mode or (Route)    Answer:   Nasal cannula    Order Specific Question:   Liters Per Minute    Answer:   2    Order Specific Question:   Keep 02 saturation    Answer:   greater than 94 %   SLP eval and treat Reason for evaluation: Cognitive/Language evaluation    Standing Status:   Standing    Number of Occurrences:   1    Order Specific Question:   Reason for evaluation    Answer:   Cognitive/Language evaluation   CBG monitoring, ED    Standing Status:   Standing    Number of Occurrences:   1   ED EKG    Altered mental status    Standing Status:   Standing    Number of Occurrences:   1    Order Specific Question:   Reason for Exam    Answer:   Other (See Comments)   Repeat EKG     Standing Status:   Standing    Number of Occurrences:   1    Order Specific Question:   Reason  for Exam    Answer:   Weakness   ECHOCARDIOGRAM COMPLETE    Standing Status:   Standing    Number of Occurrences:   1    Order Specific Question:   Perflutren DEFINITY (image enhancing agent) should be administered unless hypersensitivity or allergy exist    Answer:   Administer Perflutren    Order Specific Question:   Will [facility] be the location of this test?    Answer:   Yes    Order Specific Question:   Please indicate who you request to read the nuc med / echo results.    Answer:   [facility] Unassigned    Order Specific Question:   Reason for exam-Echo    Answer:   Stroke  I63.9    Order Specific Question:   Reason for exam-Echo    Answer:   Atrial Fibrillation  I48.91   Type and screen    Standing Status:   Standing    Number of Occurrences:   1   Admit to Inpatient (patient's expected length of stay will be greater than 2 midnights or inpatient only procedure)    Standing Status:   Standing    Number of Occurrences:   1    Order Specific Question:   Hospital Area    Answer:   [facility] [[id number]]    Order Specific Question:   Level of Care    Answer:   Telemetry Cardiac [103]    Order Specific Question:   Covid Evaluation    Answer:   Asymptomatic - no recent exposure (last 10 days) testing not required    Order Specific Question:   Diagnosis    Answer:   Aphasia Agafon.Baumgarten.3.ICD-9-CM]    Order Specific Question:   Admitting Physician    Answer:   Darrold Junker    Order Specific Question:   Attending Physician    Answer:   Darrold Junker    Order Specific Question:   Certification:    Answer:   I certify this patient will need inpatient services for at least 2 midnights    Order Specific Question:   Expected Medical Readiness    Answer:   08/04/2023   Fall precautions    Standing Status:   Standing    Number of Occurrences:   1    Aspiration precautions    Standing Status:   Standing    Number of Occurrences:   1

## 2023-08-02 NOTE — Assessment & Plan Note (Signed)
New onset.  Suspect she has PAF and may have had it before and caused her earlier strokes.  We will currently obtain MRI head cta chest and decide on Asa 81 mg or anticoagulation or DAPT based on results of study.

## 2023-08-02 NOTE — Assessment & Plan Note (Signed)
Vitals:   08/02/23 1434 08/02/23 1700 08/02/23 1900  BP: 133/65 (!) 128/55 109/71  We will hold losartan/ hydrochlorothiazide.

## 2023-08-02 NOTE — ED Provider Notes (Signed)
[facility] Provider Note    Event Date/Time   First MD Initiated Contact with Patient 08/02/23 1624     (approximate)   History   No chief complaint on file.   HPI  Connie Frederick is a 89 year old female with history of HTN, diabetes, CAD dementia presenting to the emergency department for evaluation of altered mental status.  Accompanied by daughter who provides history.  She reports that a few hours prior to presentation the patient reported that she was dizzy so she recommended that she stay seated for a few minutes.  She then noticed that the patient was shaky all over, no rhythmic movement.  She was awake, but her speech was garbled and nonsensical.  This lasted for 15 to 20 minutes.  Denies any similar episodes in the past.  Does feel that the patient is currently improved.  Patient reports that she had been feeling dizzy described as a spinning sensation earlier, but says that this is now resolved.  Denies other complaints.     Physical Exam   Triage Vital Signs: ED Triage Vitals  Encounter Vitals Group     BP 08/02/23 1434 133/65     Systolic BP Percentile --      Diastolic BP Percentile --      Pulse Rate 08/02/23 1434 95     Resp 08/02/23 1434 18     Temp 08/02/23 1434 98.3 F (36.8 C)     Temp Source 08/02/23 1434 Oral     SpO2 08/02/23 1434 100 %     Weight 08/02/23 1638 160 lb (72.6 kg)     Height 08/02/23 1437 5' (1.524 m)     Head Circumference --      Peak Flow --      Pain Score 08/02/23 1437 0     Pain Loc --      Pain Education --      Exclude from Growth Chart --     Most recent vital signs: Vitals:   08/02/23 1639 08/02/23 1700  BP:  (!) 128/55  Pulse: 93 (!) 49  Resp: 18 (!) 22  Temp:    SpO2:  96%     General: Awake, interactive  CV:  Regular rate, good peripheral perfusion.  Resp:  Unlabored respirations, lungs clear to auscultation Abd:  Nondistended, soft Neuro:  Keenly aware, correctly answers month and age,  able to blink eyes and squeeze hands, normal horizontal extraocular movements, no visual field loss, normal facial symmetry, no arm or leg motor drift, no limb ataxia, normal sensation, no aphasia, no dysarthria, no inattention  ED Results / Procedures / Treatments   Labs (all labs ordered are listed, but only abnormal results are displayed) Labs Reviewed  BASIC METABOLIC PANEL - Abnormal; Notable for the following components:      Result Value   Creatinine, Ser 1.14 (*)    GFR, Estimated 46 (*)    All other components within normal limits  CBC - Abnormal; Notable for the following components:   Hemoglobin 10.6 (*)    HCT 34.0 (*)    All other components within normal limits  URINALYSIS, ROUTINE W REFLEX MICROSCOPIC  CBG MONITORING, ED     EKG EKG independently reviewed interpreted by myself (ER attending) demonstrates:    RADIOLOGY Imaging independently reviewed and interpreted by myself demonstrates:  EKG demonstrates A-fib versus sinus with ectopy at a rate of 71, QRS 82, QTc 421, no acute ST changes though significant artifact  present Repeat EKG at 1650 demonstrates an irregularly irregular rhythm, rhythm most appreciable in lead V1, does appear that this may be sinus rhythm with ectopy, computer interpreted rate at 139, but appears to have normal rate, QRS 130, QTc 565 per computer that more difficult to interpret in the setting of artifact  PROCEDURES:  Critical Care performed: No  Procedures   MEDICATIONS ORDERED IN ED: Medications - No data to display   IMPRESSION / MDM / ASSESSMENT AND PLAN / ED COURSE  I reviewed the triage vital signs and the nursing notes.  Differential diagnosis includes, but is not limited to, TIA, CVA, arrhythmia, anemia, electrolyte abnormality  Patient's presentation is most consistent with acute presentation with potential threat to life or bodily function.  89 year old female presenting to the emergency department for evaluation  after an episode of dizziness with dysarthria and confusion.  Vital stable on presentation.  EKG with significant artifact, questionable A-fib though I suspect that this may actually be sinus with ectopy.  However, episode of slurred speech is concerning.  ABCD 2 score of 4, moderate risk TIA.  With this, do think admission is reasonable.  Will reach out to hospitalist team.  Reviewed with hospitalist team.  They will evaluate the patient for anticipated admission.    FINAL CLINICAL IMPRESSION(S) / ED DIAGNOSES   Final diagnoses:  Slurred speech  Dizziness     Rx / DC Orders   ED Discharge Orders     None        Note:  This document was prepared using Dragon voice recognition software and may include unintentional dictation errors.   Trinna Post, MD 08/02/23 [phone]

## 2023-08-02 NOTE — Assessment & Plan Note (Signed)
Stage II. Monitor and MIVF to prevent Worsening of CKD . Renally dose meds and limit contrast to needed study only.

## 2023-08-02 NOTE — Assessment & Plan Note (Signed)
Pt is nonfocal at baseline as well as today . Cont asa 81 mg/ statin.

## 2023-08-02 NOTE — ED Notes (Addendum)
EKG attempted, pt is shaking so EKG is not completely readable.

## 2023-08-02 NOTE — Assessment & Plan Note (Addendum)
Concerning for CVA in light of her h/o cva we will proceed with MRI and MRA and cont with asa 81 with statin therapy. BP is low we will do manual check, pt given saltines and ns 250 cc bolus.  Higher BP is needed for her cva and also for possible need to anti anxiety for MRI to avoid tremor for better clarity and imagine on MRI.  It is not clear if this is another cva/tia/ pe or cardiac event.

## 2023-08-03 ENCOUNTER — Observation Stay
Admit: 2023-08-03 | Discharge: 2023-08-03 | Disposition: A | Discharge status: Home / Self Care | Payer: 59 | Attending: Internal Medicine

## 2023-08-03 ENCOUNTER — Other Ambulatory Visit: Payer: Self-pay

## 2023-08-03 ENCOUNTER — Encounter: Payer: Self-pay | Admitting: Internal Medicine

## 2023-08-03 DIAGNOSIS — R4781 Slurred speech: Principal | ICD-10-CM

## 2023-08-03 DIAGNOSIS — R42 Dizziness and giddiness: Secondary | ICD-10-CM

## 2023-08-03 DIAGNOSIS — I10 Essential (primary) hypertension: Secondary | ICD-10-CM

## 2023-08-03 DIAGNOSIS — G250 Essential tremor: Secondary | ICD-10-CM | POA: Diagnosis not present

## 2023-08-03 DIAGNOSIS — F0390 Unspecified dementia without behavioral disturbance: Secondary | ICD-10-CM

## 2023-08-03 DIAGNOSIS — Z8673 Personal history of transient ischemic attack (TIA), and cerebral infarction without residual deficits: Secondary | ICD-10-CM

## 2023-08-03 DIAGNOSIS — R4701 Aphasia: Secondary | ICD-10-CM | POA: Diagnosis not present

## 2023-08-03 DIAGNOSIS — I480 Paroxysmal atrial fibrillation: Secondary | ICD-10-CM | POA: Diagnosis not present

## 2023-08-03 DIAGNOSIS — N182 Chronic kidney disease, stage 2 (mild): Secondary | ICD-10-CM

## 2023-08-03 LAB — ECHOCARDIOGRAM COMPLETE
AR max vel: 3.04 cm2
AV Area VTI: 3 cm2
AV Area mean vel: 3.21 cm2
AV Mean grad: 2 mm[Hg]
AV Peak grad: 4.2 mmHg
Ao pk vel: 1.02 m/s
Area-P 1/2: 2.72 cm2
Height: 60 in
MV VTI: 2.03 cm2
S' Lateral: 2.6 cm
Weight: 2560 [oz_av]

## 2023-08-03 MED ORDER — ADULT MULTIVITAMIN W/MINERALS CH
1.0000 | ORAL_TABLET | Freq: Every day | ORAL | Status: DC
  Administered 2023-08-03: 1 via ORAL
  Filled 2023-08-03: qty 1

## 2023-08-03 MED ORDER — ROSUVASTATIN CALCIUM 20 MG PO TABS: 20.0000 mg | ORAL_TABLET | Freq: Every day | ORAL | 1 refills | Status: AC

## 2023-08-03 NOTE — TOC Initial Note (Signed)
Transition of Care [facility]) - Initial/Assessment Note    Patient Details  Name: Connie Frederick MRN: [id number] Date of Birth: 1933/05/22  [facility]) CM/SW Contact:    Marquita Palms, LCSW Phone Number: 08/03/2023, 1:32 PM  Clinical Narrative:                  CSW met with patient bedside who asked me to speak with her daughter, Daughter was unavailable by phone. 10am 1:32pm Code 44 given to daughter who was confused about if patient was leaving the hospital of staying, she reported noone had reached out to her. CSWA contacted nurse and MD to please call patients daughter to explain. Code 44 given.        Patient Goals and CMS Choice            Expected Discharge Plan and Services                                              Prior Living Arrangements/Services                       Activities of Daily Living   ADL Screening (condition at time of admission) Independently performs ADLs?: No Does the patient have a NEW difficulty with bathing/dressing/toileting/self-feeding that is expected to last >3 days?: No Does the patient have a NEW difficulty with getting in/out of bed, walking, or climbing stairs that is expected to last >3 days?: No Does the patient have a NEW difficulty with communication that is expected to last >3 days?: No Is the patient deaf or have difficulty hearing?: No Does the patient have difficulty seeing, even when wearing glasses/contacts?: No Does the patient have difficulty concentrating, remembering, or making decisions?: No  Permission Sought/Granted                  Emotional Assessment              Admission diagnosis:  Aphasia [R47.01] Patient Active Problem List   Diagnosis Date Noted   Aphasia 08/02/2023   History of stroke 08/02/2023   HTN (hypertension), benign 08/02/2023   Paroxysmal atrial fibrillation (HCC) 08/02/2023   Dementia without behavioral disturbance (HCC) 08/02/2023    Benign essential tremor 08/02/2023   CKD (chronic kidney disease) stage 2, GFR 60-89 ml/min 08/02/2023   PCP:  [facility]:   [facility] - [place], [place] - [address] [place] [address] Phone: [phone] Fax: [phone]     Social Determinants of Health (SDOH) Social History: SDOH Screenings   Food Insecurity: No Food Insecurity (08/03/2023)  Housing: Low Risk  (08/03/2023)  Transportation Needs: No Transportation Needs (08/03/2023)  Utilities: Not At Risk (08/03/2023)   SDOH Interventions:     Readmission Risk Interventions    08/03/2023    1:30 PM  Readmission Risk Prevention Plan  Post Dischage Appt Complete  Medication Screening Complete  Transportation Screening Complete

## 2023-08-03 NOTE — Care Management CC44 (Signed)
Condition Code 44 Documentation Completed  Patient Details  Name: Shaneca Dervisevic MRN: [id number] Date of Birth: [birth year]   Condition Code 44 given:  Yes Patient signature on Condition Code 44 notice:  Yes Documentation of 2 MD's agreement:  Yes Code 44 added to claim:  Yes    Marquita Palms, LCSW 08/03/2023, 1:44 PM

## 2023-08-03 NOTE — Discharge Summary (Signed)
Physician Discharge Summary   Patient: Connie [facility] MRN: [id number] DOB: 1932-10-15  Admit date:     08/02/2023  Discharge date: 08/03/23  Discharge Physician: Arnetha Courser   PCP: [facility]   Recommendations at discharge:  Please obtain CBC and BMP on follow-up Follow-up with his primary care provider within a week  Discharge Diagnoses: Principal Problem:   Aphasia Active Problems:   History of stroke   HTN (hypertension), benign   Paroxysmal atrial fibrillation (HCC)   Dementia without behavioral disturbance (HCC)   Benign essential tremor   CKD (chronic kidney disease) stage 2, GFR 60-89 ml/min   Slurred speech   Dizziness   [facility] Course: Taken from H&P.   [name] is a 89 y.o. female with medical history significant for Past medical history of diabetes melitis, hypertension, vertigo, sinus bradycardia, mild CKD stage II, history of heart disease, glaucoma, dementia, coming to the emergency room for dizziness and difficulty speaking, she was aphasic for about 15 to 20 minutes after that her speech resumed and was normal.  On presentation hemodynamically stable, labs with creatinine of 1.14, CBC is stable.  CT head with no acute intracranial abnormality but did show prior old infarcts in the right thalamus and right cerebellar hemisphere with chronic small vessel changes. EKG with concern of A-fib but patient was very shaky during the exam so likely some artifacts.  11/13: Vital stable, MRI brain with no acute intracranial abnormality and chronic changes.  MRA head and neck with no acute abnormality. CTA was obtained due to mildly elevated D-dimer which was normal if adjusted to age and they were negative for any PE or other pulmonary disease. Echocardiogram with normal EF, grade 1 diastolic dysfunction and no other significant abnormality.  PT is recommending home health which was ordered.  Repeat EKG with sinus rhythm and paroxysmal atrial  contractions, prior concern of A-fib was likely due to artifact as patient was shaking a lot during that exam.  Overall improved, no more dizziness and thinks that she is at baseline.  Able to work with physical therapy.  Completed the TIA workup.  No acute stroke found.  Patient will continue on current medications and need to have a close follow-up with her providers for further management.        Consultants: None Procedures performed: None Disposition: Home health Diet recommendation:  Discharge Diet Orders (From admission, onward)     Start     Ordered   08/03/23 0000  Diet - low sodium heart healthy        08/03/23 1615           Cardiac diet DISCHARGE MEDICATION: Allergies as of 08/03/2023   No Known Allergies      Medication List     TAKE these medications    Aspirin Low Dose 81 MG tablet Generic drug: aspirin EC Take 81 mg by mouth daily.   cholecalciferol 25 MCG (1000 UNIT) tablet Commonly known as: VITAMIN D3 Take 1,000 Units by mouth daily.   donepezil 5 MG tablet Commonly known as: ARICEPT Take 5 mg by mouth daily.   hydrochlorothiazide 12.5 MG capsule Commonly known as: MICROZIDE Take 12.5 mg by mouth daily.   losartan 100 MG tablet Commonly known as: COZAAR Take 100 mg by mouth daily.   rosuvastatin 20 MG tablet Commonly known as: CRESTOR Take 1 tablet (20 mg total) by mouth daily. What changed:  medication strength how much to take  Follow-up Information     [facility]. Schedule an appointment as soon as possible for a visit in 1 week(s).   Specialty: General Practice Contact information: [address] [place] [address] [phone]                Discharge Exam: Ceasar Mons Weights   08/02/23 1638  Weight: 72.6 kg   General.  Frail elderly lady, in no acute distress. Pulmonary.  Lungs clear bilaterally, normal respiratory effort. CV.  Regular rate and rhythm, no  JVD, rub or murmur. Abdomen.  Soft, nontender, nondistended, BS positive. CNS.  Alert and oriented .  No focal neurologic deficit. Extremities.  No edema, no cyanosis, pulses intact and symmetrical.  Condition at discharge: stable  The results of significant diagnostics from this hospitalization (including imaging, microbiology, ancillary and laboratory) are listed below for reference.   Imaging Studies: ECHOCARDIOGRAM COMPLETE  Result Date: 08/03/2023    ECHOCARDIOGRAM REPORT   Patient Name:   Connie [facility] Date of Exam: 08/03/2023 Medical Rec #:  [id number]    Height:       60.0 in Accession #:    [id number]   Weight:       160.0 lb Date of Birth:  05/31/1933    BSA:          1.698 m Patient Age:    89 years     BP:           119/97 mmHg Patient Gender: F            HR:           72 bpm. Exam Location:  [facility] Procedure: 2D Echo, Color Doppler and Cardiac Doppler Indications:     Stroke I63.9                  Atrial Fibrillation I48.91  History:         Patient has no prior history of Echocardiogram examinations. No                  past medical history on file.  Sonographer:     Cristela Blue Referring Phys:  [id number] [place] [facility] Shahrzad Koble Diagnosing Phys: Julien Nordmann MD IMPRESSIONS  1. Left ventricular ejection fraction, by estimation, is 55 to 60%. The left ventricle has normal function. The left ventricle has no regional wall motion abnormalities. Left ventricular diastolic parameters are consistent with Grade I diastolic dysfunction (impaired relaxation).  2. Right ventricular systolic function is normal. The right ventricular size is normal. There is normal pulmonary artery systolic pressure. The estimated right ventricular systolic pressure is 18.5 mmHg.  3. The mitral valve is normal in structure. Mild to moderate mitral valve regurgitation. No evidence of mitral stenosis.  4. The aortic valve is tricuspid. Aortic valve regurgitation is not visualized. No aortic stenosis is present.  5. The inferior vena  cava is normal in size with greater than 50% respiratory variability, suggesting right atrial pressure of 3 mmHg. FINDINGS  Left Ventricle: Left ventricular ejection fraction, by estimation, is 55 to 60%. The left ventricle has normal function. The left ventricle has no regional wall motion abnormalities. The left ventricular internal cavity size was normal in size. There is  no left ventricular hypertrophy. Left ventricular diastolic parameters are consistent with Grade I diastolic dysfunction (impaired relaxation). Right Ventricle: The right ventricular size is normal. No increase in right ventricular wall thickness. Right ventricular systolic function is normal. There is normal pulmonary artery  systolic pressure. The tricuspid regurgitant velocity is 1.84 m/s, and  with an assumed right atrial pressure of 5 mmHg, the estimated right ventricular systolic pressure is 18.5 mmHg. Left Atrium: Left atrial size was normal in size. Right Atrium: Right atrial size was normal in size. Pericardium: There is no evidence of pericardial effusion. Mitral Valve: The mitral valve is normal in structure. Mild to moderate mitral valve regurgitation. No evidence of mitral valve stenosis. MV peak gradient, 3.9 mmHg. The mean mitral valve gradient is 1.0 mmHg. Tricuspid Valve: The tricuspid valve is normal in structure. Tricuspid valve regurgitation is mild . No evidence of tricuspid stenosis. Aortic Valve: The aortic valve is tricuspid. Aortic valve regurgitation is not visualized. No aortic stenosis is present. Aortic valve mean gradient measures 2.0 mmHg. Aortic valve peak gradient measures 4.2 mmHg. Aortic valve area, by VTI measures 3.00 cm. Pulmonic Valve: The pulmonic valve was normal in structure. Pulmonic valve regurgitation is not visualized. No evidence of pulmonic stenosis. Aorta: The aortic root is normal in size and structure. Venous: The inferior vena cava is normal in size with greater than 50% respiratory  variability, suggesting right atrial pressure of 3 mmHg. IAS/Shunts: No atrial level shunt detected by color flow Doppler.  LEFT VENTRICLE PLAX 2D LVIDd:         4.00 cm   Diastology LVIDs:         2.60 cm   LV e' medial:    7.51 cm/s LV PW:         1.10 cm   LV E/e' medial:  10.7 LV IVS:        1.20 cm   LV e' lateral:   7.51 cm/s LVOT diam:     2.10 cm   LV E/e' lateral: 10.7 LV SV:         67 LV SV Index:   39 LVOT Area:     3.46 cm  RIGHT VENTRICLE RV Basal diam:  1.30 cm RV Mid diam:    1.50 cm RV S prime:     11.40 cm/s TAPSE (M-mode): 1.9 cm LEFT ATRIUM           Index        RIGHT ATRIUM          Index LA diam:      3.70 cm 2.18 cm/m   RA Area:     5.50 cm LA Vol (A2C): 23.9 ml 14.08 ml/m  RA Volume:   6.35 ml  3.74 ml/m LA Vol (A4C): 36.7 ml 21.62 ml/m  AORTIC VALVE AV Area (Vmax):    3.04 cm AV Area (Vmean):   3.21 cm AV Area (VTI):     3.00 cm AV Vmax:           102.00 cm/s AV Vmean:          67.400 cm/s AV VTI:            0.222 m AV Peak Grad:      4.2 mmHg AV Mean Grad:      2.0 mmHg LVOT Vmax:         89.40 cm/s LVOT Vmean:        62.500 cm/s LVOT VTI:          0.192 m LVOT/AV VTI ratio: 0.86  AORTA Ao Root diam: 3.20 cm MITRAL VALVE               TRICUSPID VALVE MV Area (PHT): 2.72 cm    TR  Peak grad:   13.5 mmHg MV Area VTI:   2.03 cm    TR Vmax:        184.00 cm/s MV Peak grad:  3.9 mmHg MV Mean grad:  1.0 mmHg    SHUNTS MV Vmax:       0.98 m/s    Systemic VTI:  0.19 m MV Vmean:      52.5 cm/s   Systemic Diam: 2.10 cm MV Decel Time: 279 msec MV E velocity: 80.20 cm/s MV A velocity: 87.50 cm/s MV E/A ratio:  0.92 Julien Nordmann MD Electronically signed by Julien Nordmann MD Signature Date/Time: 08/03/2023/4:12:53 PM    Final    MR ANGIO NECK W WO CONTRAST  Result Date: 08/03/2023 CLINICAL DATA:  Initial evaluation for neuro deficit, stroke suspected, dizziness. EXAM: MRA NECK WITHOUT AND WITH CONTRAST TECHNIQUE: Multiplanar and multiecho pulse sequences of the neck were obtained  without and with intravenous contrast. Angiographic images of the neck were obtained using MRA technique without and with intravenous contrast. CONTRAST:  7mL GADAVIST GADOBUTROL 1 MMOL/ML IV SOLN COMPARISON:  None Available. FINDINGS: AORTIC ARCH: Visualized aortic arch within normal limits for caliber with standard 3 vessel morphology. No stenosis or other abnormality about the origin of the great vessels. RIGHT CAROTID SYSTEM: Right common and internal carotid arteries are patent with antegrade flow. No evidence for dissection. No hemodynamically significant stenosis about the right carotid artery system. LEFT CAROTID SYSTEM: Left common and internal carotid arteries are patent with antegrade flow. No evidence for dissection. No hemodynamically significant stenosis about the left carotid artery system. VERTEBRAL ARTERIES: Both vertebral arteries arise from subclavian arteries. No proximal subclavian artery stenosis. Right vertebral artery dominant. Vertebral arteries are patent without stenosis or dissection. Other: None. IMPRESSION: Normal MRA of the neck. Electronically Signed   By: Rise Mu M.D.   On: 08/03/2023 00:45   MR BRAIN WO CONTRAST  Result Date: 08/03/2023 CLINICAL DATA:  Initial evaluation for neuro deficit, stroke suspected. EXAM: MRI HEAD WITHOUT CONTRAST TECHNIQUE: Multiplanar, multiecho pulse sequences of the brain and surrounding structures were obtained without intravenous contrast. COMPARISON:  Prior CT from earlier the same day. FINDINGS: Brain: Examination mildly limited as the coronal DWI sequences intercalated with the ADC map, limiting assessment on this sequence. Generalized age-related cerebral atrophy. Patchy T2/FLAIR hyperintensity involving the periventricular deep white matter both cerebral hemispheres as well as the pons, consistent with chronic small vessel ischemic disease, moderately advanced. Few scatter remote lacunar infarcts present about the bilateral basal  ganglia and thalami. Small remote bilateral cerebellar infarcts, right larger than left. No abnormal foci of restricted diffusion to suggest acute or subacute ischemia. Gray-white matter differentiation maintained. No acute intracranial hemorrhage. Single chronic microhemorrhage noted at the left frontal lobe, of doubtful significance in isolation. No mass lesion, midline shift or mass effect. No hydrocephalus or extra-axial fluid collection. Partially empty sella noted. Vascular: Major intracranial vascular flow voids are maintained. Skull and upper cervical spine: Craniocervical junction within normal limits. Bone marrow signal intensity normal. No scalp soft tissue abnormality. Sinuses/Orbits: Prior bilateral ocular lens replacement. Paranasal sinuses are largely clear. No significant mastoid effusion. Other: None. IMPRESSION: 1. No acute intracranial abnormality. 2. Age-related cerebral atrophy with moderate chronic microvascular ischemic disease, with a few scattered remote lacunar infarcts about the bilateral basal ganglia and thalami. 3. Small remote bilateral cerebellar infarcts, right larger than left. Electronically Signed   By: Rise Mu M.D.   On: 08/03/2023 00:41   CT Angio Chest  Pulmonary Embolism (PE) W or WO Contrast  Result Date: 08/02/2023 CLINICAL DATA:  Dizziness tremors EXAM: CT ANGIOGRAPHY CHEST WITH CONTRAST TECHNIQUE: Multidetector CT imaging of the chest was performed using the standard protocol during bolus administration of intravenous contrast. Multiplanar CT image reconstructions and MIPs were obtained to evaluate the vascular anatomy. RADIATION DOSE REDUCTION: This exam was performed according to the departmental dose-optimization program which includes automated exposure control, adjustment of the mA and/or kV according to patient size and/or use of iterative reconstruction technique. CONTRAST:  75mL OMNIPAQUE IOHEXOL 350 MG/ML SOLN COMPARISON:  None Available.  FINDINGS: Cardiovascular: Satisfactory opacification of the pulmonary arteries to the segmental level. No evidence of pulmonary embolism. Nonaneurysmal aorta. No dissection is seen. Normal cardiac size. No pericardial effusion Mediastinum/Nodes: Midline trachea. No thyroid mass. No suspicious lymph nodes. Esophagus within normal limits other than small hiatal hernia. Lungs/Pleura: Lungs are clear. No pleural effusion or pneumothorax. Upper Abdomen: Gallstones Musculoskeletal: No acute or suspicious osseous abnormality. Review of the MIP images confirms the above findings. IMPRESSION: 1. Negative. No CT evidence for acute pulmonary embolus. Clear lung fields. 2. Gallstones. Aortic Atherosclerosis (ICD10-I70.0). Electronically Signed   By: Jasmine Pang M.D.   On: 08/02/2023 23:02   CT HEAD WO CONTRAST  Result Date: 08/02/2023 CLINICAL DATA:  Syncope/presyncope, cerebrovascular cause suspected. Dizziness and tremors. EXAM: CT HEAD WITHOUT CONTRAST TECHNIQUE: Contiguous axial images were obtained from the base of the skull through the vertex without intravenous contrast. RADIATION DOSE REDUCTION: This exam was performed according to the departmental dose-optimization program which includes automated exposure control, adjustment of the mA and/or kV according to patient size and/or use of iterative reconstruction technique. COMPARISON:  Head CT 05/01/2023. FINDINGS: Brain: No acute hemorrhage. Unchanged moderate chronic small-vessel disease with old perforator infarcts in the right thalamus and right cerebellar hemisphere. Cortical gray-white differentiation is otherwise preserved. Prominence of the ventricles and sulci within expected range for age. No hydrocephalus or extra-axial collection. No mass effect or midline shift. Vascular: No hyperdense vessel or unexpected calcification. Skull: No calvarial fracture or suspicious bone lesion. Skull base is unremarkable. Sinuses/Orbits: No acute finding. Other: None.  IMPRESSION: 1. No acute intracranial abnormality. 2. Unchanged moderate chronic small-vessel disease with old perforator infarcts in the right thalamus and right cerebellar hemisphere. Electronically Signed   By: Orvan Falconer M.D.   On: 08/02/2023 15:09    Microbiology: No results found for this or any previous visit.  Labs: CBC: Recent Labs  Lab 08/02/23 1441  WBC 5.1  HGB 10.6*  HCT 34.0*  MCV 87.2  PLT 178   Basic Metabolic Panel: Recent Labs  Lab 08/02/23 1441 08/02/23 1834  NA 139  --   K 3.7  --   CL 106  --   CO2 26  --   GLUCOSE 94  --   BUN 20  --   CREATININE 1.14*  --   CALCIUM 9.5  --   MG  --  2.0   Liver Function Tests: Recent Labs  Lab 08/02/23 1834  AST 22  ALT 15  ALKPHOS 57  BILITOT 0.7  PROT 6.9  ALBUMIN 3.9   CBG: No results for input(s): "GLUCAP" in the last 168 hours.  Discharge time spent: greater than 30 minutes.  This record has been created using Conservation officer, historic buildings. Errors have been sought and corrected,but may not always be located. Such creation errors do not reflect on the standard of care.   Signed: Arnetha Courser, MD [facility]  08/03/2023 

## 2023-08-03 NOTE — Plan of Care (Signed)
Problem: Education: Goal: Knowledge of disease or condition will improve 08/03/2023 1633 by Lars Pinks, RN Outcome: Adequate for Discharge 08/03/2023 1551 by Lars Pinks, RN Outcome: Progressing Goal: Knowledge of secondary prevention will improve (MUST DOCUMENT ALL) 08/03/2023 1633 by Lars Pinks, RN Outcome: Adequate for Discharge 08/03/2023 1551 by Lars Pinks, RN Outcome: Progressing Goal: Knowledge of patient specific risk factors will improve Loraine Leriche N/A or DELETE if not current risk factor) 08/03/2023 1633 by Lars Pinks, RN Outcome: Adequate for Discharge 08/03/2023 1551 by Lars Pinks, RN Outcome: Progressing   Problem: Ischemic Stroke/TIA Tissue Perfusion: Goal: Complications of ischemic stroke/TIA will be minimized 08/03/2023 1633 by Lars Pinks, RN Outcome: Adequate for Discharge 08/03/2023 1551 by Lars Pinks, RN Outcome: Progressing   Problem: Coping: Goal: Will verbalize positive feelings about self 08/03/2023 1633 by Lars Pinks, RN Outcome: Adequate for Discharge 08/03/2023 1551 by Lars Pinks, RN Outcome: Progressing Goal: Will identify appropriate support needs 08/03/2023 1633 by Lars Pinks, RN Outcome: Adequate for Discharge 08/03/2023 1551 by Lars Pinks, RN Outcome: Progressing   Problem: Health Behavior/Discharge Planning: Goal: Ability to manage health-related needs will improve 08/03/2023 1633 by Lars Pinks, RN Outcome: Adequate for Discharge 08/03/2023 1551 by Lars Pinks, RN Outcome: Progressing Goal: Goals will be collaboratively established with patient/family 08/03/2023 1633 by Lars Pinks, RN Outcome: Adequate for Discharge 08/03/2023 1551 by Lars Pinks, RN Outcome: Progressing   Problem: Self-Care: Goal: Ability to participate in self-care as condition permits will improve 08/03/2023 1633 by Lars Pinks, RN Outcome: Adequate for  Discharge 08/03/2023 1551 by Lars Pinks, RN Outcome: Progressing Goal: Verbalization of feelings and concerns over difficulty with self-care will improve 08/03/2023 1633 by Lars Pinks, RN Outcome: Adequate for Discharge 08/03/2023 1551 by Lars Pinks, RN Outcome: Progressing Goal: Ability to communicate needs accurately will improve 08/03/2023 1633 by Lars Pinks, RN Outcome: Adequate for Discharge 08/03/2023 1551 by Lars Pinks, RN Outcome: Progressing   Problem: Nutrition: Goal: Risk of aspiration will decrease 08/03/2023 1633 by Lars Pinks, RN Outcome: Adequate for Discharge 08/03/2023 1551 by Lars Pinks, RN Outcome: Progressing Goal: Dietary intake will improve 08/03/2023 1633 by Lars Pinks, RN Outcome: Adequate for Discharge 08/03/2023 1551 by Lars Pinks, RN Outcome: Progressing   Problem: Education: Goal: Knowledge of General Education information will improve Description: Including pain rating scale, medication(s)/side effects and non-pharmacologic comfort measures 08/03/2023 1633 by Lars Pinks, RN Outcome: Adequate for Discharge 08/03/2023 1551 by Lars Pinks, RN Outcome: Progressing   Problem: Health Behavior/Discharge Planning: Goal: Ability to manage health-related needs will improve 08/03/2023 1633 by Lars Pinks, RN Outcome: Adequate for Discharge 08/03/2023 1551 by Lars Pinks, RN Outcome: Progressing   Problem: Clinical Measurements: Goal: Ability to maintain clinical measurements within normal limits will improve 08/03/2023 1633 by Lars Pinks, RN Outcome: Adequate for Discharge 08/03/2023 1551 by Lars Pinks, RN Outcome: Progressing Goal: Will remain free from infection 08/03/2023 1633 by Lars Pinks, RN Outcome: Adequate for Discharge 08/03/2023 1551 by Lars Pinks, RN Outcome: Progressing Goal: Diagnostic test results will improve 08/03/2023 1633 by Lars Pinks, RN Outcome: Adequate for Discharge 08/03/2023 1551 by Lars Pinks, RN Outcome: Progressing Goal: Respiratory complications will improve 08/03/2023 1633 by Lars Pinks, RN Outcome: Adequate for Discharge 08/03/2023 1551 by Lars Pinks, RN Outcome: Progressing Goal: Cardiovascular complication will be avoided  08/03/2023 1633 by Lars Pinks, RN Outcome: Adequate for Discharge 08/03/2023 1551 by Lars Pinks, RN Outcome: Progressing   Problem: Activity: Goal: Risk for activity intolerance will decrease 08/03/2023 1633 by Lars Pinks, RN Outcome: Adequate for Discharge 08/03/2023 1551 by Lars Pinks, RN Outcome: Progressing   Problem: Nutrition: Goal: Adequate nutrition will be maintained 08/03/2023 1633 by Lars Pinks, RN Outcome: Adequate for Discharge 08/03/2023 1551 by Lars Pinks, RN Outcome: Progressing   Problem: Coping: Goal: Level of anxiety will decrease 08/03/2023 1633 by Lars Pinks, RN Outcome: Adequate for Discharge 08/03/2023 1551 by Lars Pinks, RN Outcome: Progressing   Problem: Elimination: Goal: Will not experience complications related to bowel motility 08/03/2023 1633 by Lars Pinks, RN Outcome: Adequate for Discharge 08/03/2023 1551 by Lars Pinks, RN Outcome: Progressing Goal: Will not experience complications related to urinary retention 08/03/2023 1633 by Lars Pinks, RN Outcome: Adequate for Discharge 08/03/2023 1551 by Lars Pinks, RN Outcome: Progressing   Problem: Pain Management: Goal: General experience of comfort will improve 08/03/2023 1633 by Lars Pinks, RN Outcome: Adequate for Discharge 08/03/2023 1551 by Lars Pinks, RN Outcome: Progressing   Problem: Safety: Goal: Ability to remain free from injury will improve 08/03/2023 1633 by Lars Pinks, RN Outcome: Adequate for Discharge 08/03/2023 1551 by Lars Pinks, RN Outcome:  Progressing   Problem: Skin Integrity: Goal: Risk for impaired skin integrity will decrease 08/03/2023 1633 by Lars Pinks, RN Outcome: Adequate for Discharge 08/03/2023 1551 by Lars Pinks, RN Outcome: Progressing

## 2023-08-03 NOTE — Plan of Care (Signed)

## 2023-08-03 NOTE — Evaluation (Addendum)
Occupational Therapy Evaluation Patient Details Name: Connie Frederick MRN: [id number] DOB: 10-Aug-1933 Today's Date: 08/03/2023   History of Present Illness 89 y.o. female with PMHx of DM, hypertension, vertigo, sinus bradycardia, mild CKD stage II, heart disease, glaucoma, dementia, benign essential tremor, coming to the ED for dizziness, being shaky all over and reportedly had speech difficulty and was aphasic for about 15-20 minutes after then speech resumed and was normal. Possible new A-fib. MRI brain: no acute issues, remote lacunar infarcts of bil basal ganglia and thalami and remote bilateral cerebellar infarcts. CT negative for PE.   Clinical Impression   Pt was seen for OT evaluation this date. Prior to hospital admission, pt was living at home with her daughter where she reports IND with ADLs and some IADLs including cleaning and light cooking. Daughter drives her to her appts. Reports no AD use indoors, but use of RW for community mobility.  Pt presents to acute OT demonstrating impaired ADL performance and functional mobility 2/2 weakness, balance deficits (See OT problem list for additional functional deficits). No c/o pain. No UE deficits or weakness noted. Pt currently requires MOD I for bed mobility. SUP/SBA for STS from EOB and SPT to [facility]. Required CGA for mobility in the room using no AD and noted to grab for foot of the bed as she reports being a furniture/wall surfer at home. May benefit from RW use during PT eval for longer distance mobility, unsure if RW will fit in her home. Pt is very close to her baseline function, but will  benefit from skilled OT services to address noted impairments and functional limitations while hospitalized. Recommend HH therapy, but may not need it/decline need.       If plan is discharge home, recommend the following: A little help with walking and/or transfers;Assistance with cooking/housework;Help with stairs or ramp for entrance;A little help with  bathing/dressing/bathroom    Functional Status Assessment  Patient has had a recent decline in their functional status and demonstrates the ability to make significant improvements in function in a reasonable and predictable amount of time.  Equipment Recommendations  None recommended by OT    Recommendations for Other Services       Precautions / Restrictions Precautions Precautions: Fall Restrictions Weight Bearing Restrictions: No      Mobility Bed Mobility Overal bed mobility: Modified Independent                  Transfers Overall transfer level: Modified independent                        Balance Overall balance assessment: Needs assistance Sitting-balance support: Feet supported Sitting balance-Leahy Scale: Good       Standing balance-Leahy Scale: Fair Standing balance comment: Fair to good standing balance with no AD use to get to [facility] and ambulate to sink and back to bed in room, noted to grab for foot of bed for stability at times                           ADL either performed or assessed with clinical judgement   ADL Overall ADL's : Needs assistance/impaired Eating/Feeding: Set up;Sitting                       Toilet Transfer: Supervision/safety;BSC/3in1   Toileting- Clothing Manipulation and Hygiene: Supervision/safety       Functional mobility during ADLs: Supervision/safety General  ADL Comments: Pt required CGA to SBA for mobility in the room with no AD use, would likely benefit from using a RW but reports she holds to furniture/walls at home. SBA needed for toilet transfer to [facility] and clothing management.     Vision         Perception         Praxis         Pertinent Vitals/Pain Pain Assessment Pain Assessment: No/denies pain     Extremity/Trunk Assessment Upper Extremity Assessment Upper Extremity Assessment: Overall WFL for tasks assessed   Lower Extremity Assessment Lower Extremity  Assessment: Overall WFL for tasks assessed       Communication Communication Communication: No apparent difficulties   Cognition Arousal: Alert Behavior During Therapy: WFL for tasks assessed/performed Overall Cognitive Status: Within Functional Limits for tasks assessed                                 General Comments: Oriented x4, pleasant and ready to go back home stating she was back to normal     General Comments       Exercises Other Exercises Other Exercises: Edu on role of OT in acute setting and importance of therapy to maximize her strength and safety to return home.   Shoulder Instructions      Home Living Family/patient expects to be discharged to:: Private residence Living Arrangements: Children (daughter) Available Help at Discharge: Family Type of Home: Mobile home Home Access: Stairs to enter Secretary/administrator of Steps: 2 Entrance Stairs-Rails: Can reach both Home Layout: One level     Bathroom Shower/Tub: Producer, television/film/video: Standard     Home Equipment: Shower seat;Grab bars - tub/shower          Prior Functioning/Environment               Mobility Comments: no AD use for household mobility, uses a RW for community mobility; daughter drives pt to MD appts, grocery store, etc ADLs Comments: IND with ADLs and pt reports she cleaned, but did not cook as much        OT Problem List: Decreased strength;Impaired balance (sitting and/or standing)      OT Treatment/Interventions: Self-care/ADL training;Therapeutic exercise;Therapeutic activities;DME and/or AE instruction;Patient/family education;Balance training    OT Goals(Current goals can be found in the care plan section) Acute Rehab OT Goals Patient Stated Goal: return home OT Goal Formulation: With patient Time For Goal Achievement: 08/17/23 Potential to Achieve Goals: Good ADL Goals Pt Will Perform Grooming: standing;with modified independence Pt  Will Perform Lower Body Bathing: with supervision;sitting/lateral leans;sit to/from stand Pt Will Perform Lower Body Dressing: with supervision;sitting/lateral leans;sit to/from stand Pt Will Transfer to Toilet: with modified independence;regular height toilet;ambulating Pt Will Perform Toileting - Clothing Manipulation and hygiene: with modified independence;sit to/from stand;sitting/lateral leans  OT Frequency: Min 1X/week    Co-evaluation              AM-PAC OT "6 Clicks" Daily Activity     Outcome Measure Help from another person eating meals?: None Help from another person taking care of personal grooming?: None Help from another person toileting, which includes using toliet, bedpan, or urinal?: A Little Help from another person bathing (including washing, rinsing, drying)?: A Little Help from another person to put on and taking off regular upper body clothing?: None Help from another person to put on and taking off regular  lower body clothing?: A Little 6 Click Score: 21   End of Session Nurse Communication: Mobility status  Activity Tolerance: Patient tolerated treatment well Patient left: in bed;with nursing/sitter in room  OT Visit Diagnosis: Other abnormalities of gait and mobility (R26.89);Unsteadiness on feet (R26.81)                Time: 3295-1884 OT Time Calculation (min): 23 min Charges:  OT General Charges $OT Visit: 1 Visit OT Evaluation $OT Eval Low Complexity: 1 Low OT Treatments $Self Care/Home Management : 8-22 mins Zakaria Sedor, OTR/L 08/03/23, 10:41 AM  Constance Goltz 08/03/2023, 10:38 AM

## 2023-08-03 NOTE — Evaluation (Signed)
Physical Therapy Evaluation Patient Details Name: Connie Frederick MRN: [id number] DOB: 29-Nov-1932 Today's Date: 08/03/2023  History of Present Illness  89 y.o. female with PMHx of DM, hypertension, vertigo, sinus bradycardia, mild CKD stage II, heart disease, glaucoma, dementia, benign essential tremor, coming to the ED for dizziness, being shaky all over and reportedly had speech difficulty and was aphasic for about 15-20 minutes after then speech resumed and was normal. Possible new A-fib. MRI brain: no acute issues, remote lacunar infarcts of bil basal ganglia and thalami and remote bilateral cerebellar infarcts. CT negative for PE.  Clinical Impression  Pt did well with PT exam, she was interactive, pleasant and eager to show how well she is able to move.  Ultimately she showed good mobility and was able to ambulate >200 ft with walker and single HHA.  Her vitals remained stable and though she reports feeling close to baseline she does endorse feeling slightly weaker and less steady than normal.  Pt will benefit from continued PT to address functional limitations and insure safe d/c.        If plan is discharge home, recommend the following: A little help with walking and/or transfers;A little help with bathing/dressing/bathroom;Assist for transportation;Assistance with cooking/housework;Help with stairs or ramp for entrance   Can travel by private vehicle        Equipment Recommendations None recommended by PT  Recommendations for Other Services       Functional Status Assessment Patient has had a recent decline in their functional status and demonstrates the ability to make significant improvements in function in a reasonable and predictable amount of time.     Precautions / Restrictions Precautions Precautions: Fall Restrictions Weight Bearing Restrictions: No      Mobility  Bed Mobility               General bed mobility comments: NT pt comfortably sitting up at EOB  finishing breakfast    Transfers Overall transfer level: Modified independent Equipment used: Rolling walker (2 wheels)               General transfer comment: able to rise to standing w/o hesitation or safety concerns    Ambulation/Gait Ambulation/Gait assistance: Modified independent (Device/Increase time) Gait Distance (Feet): 220 Feet Assistive device: Rolling walker (2 wheels), 1 person hand held assist         General Gait Details: Pt eager to get up and do some ambulation, ultimately did well.  She did most of the effort with FWW but did walk ~30 ft with light HHA with slow but steady gait.  Pt reports feeling only minimally differet from her baseline but ultimately was safe and showed good confidence.  Stairs            Wheelchair Mobility     Tilt Bed    Modified Rankin (Stroke Patients Only)       Balance Overall balance assessment: Needs assistance Sitting-balance support: Feet supported Sitting balance-Leahy Scale: Good       Standing balance-Leahy Scale: Fair Standing balance comment: confident with b/l UEs on walker, no LOBs with single UE HHA but slower and more guarded during ambulation                             Pertinent Vitals/Pain Pain Assessment Pain Assessment: No/denies pain    Home Living Family/patient expects to be discharged to:: Private residence Living Arrangements: Children Available Help at Discharge: Family Type  of Home: Mobile home Home Access: Stairs to enter Entrance Stairs-Rails: Can reach both Entrance Stairs-Number of Steps: 2   Home Layout: One level Home Equipment: Shower seat;Grab bars - tub/shower;Rolling Walker (2 wheels);Rollator (4 wheels);Cane - single point      Prior Function Prior Level of Function : Independent/Modified Independent             Mobility Comments: no AD use for household mobility, uses a RW for community mobility; daughter drives pt to MD appts, grocery store,  etc ADLs Comments: IND with ADLs and pt reports she cleans, but did not cook as much     Extremity/Trunk Assessment   Upper Extremity Assessment Upper Extremity Assessment: Generalized weakness;Overall [facility] for tasks assessed    Lower Extremity Assessment Lower Extremity Assessment: Generalized weakness;Overall WFL for tasks assessed (symmetrical and age appropriate)       Communication   Communication Communication: No apparent difficulties  Cognition Arousal: Alert Behavior During Therapy: WFL for tasks assessed/performed Overall Cognitive Status: Within Functional Limits for tasks assessed                                 General Comments: pleasant and ready to go back home stating she was feeling back to normal        General Comments General comments (skin integrity, edema, etc.): pt eager to get home ASAP    Exercises     Assessment/Plan    PT Assessment Patient needs continued PT services  PT Problem List Decreased strength;Decreased activity tolerance;Decreased balance;Decreased safety awareness;Decreased mobility;Decreased knowledge of use of DME       PT Treatment Interventions DME instruction;Gait training;Stair training;Functional mobility training;Therapeutic activities;Therapeutic exercise;Balance training;Patient/family education    PT Goals (Current goals can be found in the Care Plan section)  Acute Rehab PT Goals Patient Stated Goal: go home ASAP PT Goal Formulation: With patient Time For Goal Achievement: 08/16/23 Potential to Achieve Goals: Good    Frequency Min 1X/week     Co-evaluation               AM-PAC PT "6 Clicks" Mobility  Outcome Measure Help needed turning from your back to your side while in a flat bed without using bedrails?: None Help needed moving from lying on your back to sitting on the side of a flat bed without using bedrails?: None Help needed moving to and from a bed to a chair (including a  wheelchair)?: None Help needed standing up from a chair using your arms (e.g., wheelchair or bedside chair)?: A Little Help needed to walk in hospital room?: A Little Help needed climbing 3-5 steps with a railing? : A Little 6 Click Score: 21    End of Session Equipment Utilized During Treatment: Gait belt Activity Tolerance: Patient tolerated treatment well Patient left: in bed;with call bell/phone within reach Nurse Communication: Mobility status PT Visit Diagnosis: Muscle weakness (generalized) (M62.81);Other abnormalities of gait and mobility (R26.89)    Time: 9811-9147 PT Time Calculation (min) (ACUTE ONLY): 18 min   Charges:   PT Evaluation $PT Eval Low Complexity: 1 Low PT Treatments $Gait Training: 8-22 mins PT General Charges $$ ACUTE PT VISIT: 1 Visit         Malachi Pro, DPT 08/03/2023, 11:50 AM

## 2023-08-03 NOTE — Progress Notes (Signed)
SLP Cancellation Note  Patient Details Name: Connie Frederick MRN: [id number] DOB: 13-Apr-1933   Cancelled treatment:       Reason Eval/Treat Not Completed: SLP screened, no needs identified, will sign off (chart reviewed; consulted NSG and met w/ pt in room.)  Pt sitting EOB finishing her breakfast meal (~50% eaten). Pt denied any difficulty swallowing and is currently on a regular diet; tolerates swallowing pills w/ water per NSG. Pt requested more "coffee w/ just some sugar". Pt conversed in conversation w/out overt expressive/receptive deficits noted; pt denied any speech-language deficits. Speech clear and intelligible. Pt does have a Baseline dx of Dementia (which can impact communication abilities). Pt's presentation of speech difficulty appeared to resolve after a few minutes and "speech resumed, normal", per MD note at admit. Currently, pt is able to converse appropriately w/ others and engage in PT/OT evaluations this morning w/out difficulty per reports. No further skilled ST services indicated as pt appears at her communication baseline. Pt agreed. NSG to reconsult if any change in status while admitted.     Connie Som, MS, CCC-SLP Speech Language Pathologist Rehab Services; [facility] [phone] (ascom) Connie Frederick 08/03/2023, 10:00 AM

## 2023-08-03 NOTE — Progress Notes (Signed)
Initial Nutrition Assessment  DOCUMENTATION CODES:   Obesity unspecified  INTERVENTION:   -Continue regular diet -MVI with minerals daily -Magic cup BID with meals, each supplement provides 290 kcal and 9 grams of protein   NUTRITION DIAGNOSIS:   Increased nutrient needs related to acute illness as evidenced by estimated needs.  GOAL:   Patient will meet greater than or equal to 90% of their needs  MONITOR:   PO intake, Supplement acceptance  REASON FOR ASSESSMENT:   Consult Assessment of nutrition requirement/status  ASSESSMENT:   Pt with medical history significant for Past medical history of diabetes melitis, hypertension, vertigo, sinus bradycardia, mild CKD stage II, history of heart disease, glaucoma, dementia, admitted for dizziness, being shaky all over.  Pt admitted with aphasia concerning for CVA.   Per MD notes, plan for MRI and MRA.   Spoke with pt and daughter at bedside. Pt daughter reports pt started living with her in August. Pt with variable intake at home- some days she eats well and other days she eats very little. Pt daughter reports that this can be frustrating, as she will refuse a meal, but then eating 100% of the same meal the next day. Educated that this can be common with dementia.   Pt typically eats 2-3 meals per day (Breakfast: eggs, toast, juice, breakfast meat, and cereal; Lunch: sandwich; Dinner: meat, starch, and vegetable). Pt typically eats very well at dinner, but other meals are variable. Pt sometimes will skip lung as she typically goes to bed early (around 5:30 PM). Pt denies any difficulty chewing or swallowing foods.   Pt daughter unsure if she has last weight. Per pt, "maybe a few pounds or so". No wt history available to assess at this time.  Discussed importance of good meal and supplement intake to promote healing.   Labs reviewed.    NUTRITION - FOCUSED PHYSICAL EXAM:  Flowsheet Row Most Recent Value  Orbital Region No  depletion  Upper Arm Region Mild depletion  Thoracic and Lumbar Region No depletion  Buccal Region No depletion  Temple Region Mild depletion  Clavicle Bone Region No depletion  Clavicle and Acromion Bone Region No depletion  Scapular Bone Region No depletion  Dorsal Hand Mild depletion  Patellar Region Mild depletion  Anterior Thigh Region Mild depletion  Posterior Calf Region Mild depletion  Edema (RD Assessment) None  Hair Reviewed  Eyes Reviewed  Mouth Reviewed  Skin Reviewed  Nails Reviewed       Diet Order:   Diet Order             Diet regular Room service appropriate? Yes; Fluid consistency: Thin  Diet effective now                   EDUCATION NEEDS:   Education needs have been addressed  Skin:  Skin Assessment: Reviewed RN Assessment  Last BM:  Unknown  Height:   Ht Readings from Last 1 Encounters:  08/02/23 5' (1.524 m)    Weight:   Wt Readings from Last 1 Encounters:  08/02/23 72.6 kg    Ideal Body Weight:  45.5 kg  BMI:  Body mass index is 31.25 kg/m.  Estimated Nutritional Needs:   Kcal:  1600-1800  Protein:  80-95 grams  Fluid:  > 1.6 L    Levada Schilling, RD, LDN, CDCES Registered Dietitian III Certified Diabetes Care and Education Specialist Please refer to [facility] for RD and/or RD on-call/weekend/after hours pager

## 2023-08-03 NOTE — Care Management Obs Status (Signed)
MEDICARE OBSERVATION STATUS NOTIFICATION   Patient Details  Name: Connie Frederick MRN: [id number] Date of Birth: 30-Mar-1933   Medicare Observation Status Notification Given:  Yes    Marquita Palms, LCSW 08/03/2023, 1:44 PM

## 2023-08-03 NOTE — Hospital Course (Addendum)
Taken from H&P.   Connie Frederick is a 89 y.o. female with medical history significant for Past medical history of diabetes melitis, hypertension, vertigo, sinus bradycardia, mild CKD stage II, history of heart disease, glaucoma, dementia, coming to the emergency room for dizziness and difficulty speaking, she was aphasic for about 15 to 20 minutes after that her speech resumed and was normal.  On presentation hemodynamically stable, labs with creatinine of 1.14, CBC is stable.  CT head with no acute intracranial abnormality but did show prior old infarcts in the right thalamus and right cerebellar hemisphere with chronic small vessel changes. EKG with concern of A-fib but patient was very shaky during the exam so likely some artifacts.  11/13: Vital stable, MRI brain with no acute intracranial abnormality and chronic changes.  MRA head and neck with no acute abnormality. CTA was obtained due to mildly elevated D-dimer which was normal if adjusted to age and they were negative for any PE or other pulmonary disease. Echocardiogram with normal EF, grade 1 diastolic dysfunction and no other significant abnormality.  PT is recommending home health which was ordered.  Repeat EKG with sinus rhythm and paroxysmal atrial contractions, prior concern of A-fib was likely due to artifact as patient was shaking a lot during that exam.  Overall improved, no more dizziness and thinks that she is at baseline.  Able to work with physical therapy.  Completed the TIA workup.  No acute stroke found.  Patient will continue on current medications and need to have a close follow-up with her providers for further management.

## 2023-08-04 ENCOUNTER — Encounter: Payer: Self-pay | Admitting: Podiatry

## 2023-09-05 ENCOUNTER — Encounter: Payer: Self-pay | Admitting: Podiatry

## 2023-09-05 ENCOUNTER — Ambulatory Visit (INDEPENDENT_AMBULATORY_CARE_PROVIDER_SITE_OTHER): Payer: 59 | Admitting: Podiatry

## 2023-09-05 VITALS — Ht 60.0 in | Wt 160.0 lb

## 2023-09-05 DIAGNOSIS — M79676 Pain in unspecified toe(s): Secondary | ICD-10-CM | POA: Diagnosis not present

## 2023-09-05 DIAGNOSIS — E1151 Type 2 diabetes mellitus with diabetic peripheral angiopathy without gangrene: Secondary | ICD-10-CM

## 2023-09-05 DIAGNOSIS — B351 Tinea unguium: Secondary | ICD-10-CM | POA: Diagnosis not present

## 2023-09-05 NOTE — Progress Notes (Signed)
  Subjective:  Patient ID: Connie Frederick, female    DOB: 1933-08-28,  MRN: [id number]  89 y.o. female presents with at risk foot care. Pt has h/o NIDDM with PAD and painful elongated mycotic toenails 1-5 bilaterally which are tender when wearing enclosed shoe gear. Pain is relieved with periodic professional debridement. She is accompanied by her Daughter on today's visit. Chief Complaint  Patient presents with   Nail Problem    Pt is here for RFC, not a diabetic PCP is Dr Allena Katz and LOV was last month.    [facility]P: [facility].  New problem(s): None.   Review of Systems: Negative except as noted in the HPI.   No Known Allergies  Objective:  There were no vitals filed for this visit. Constitutional Patient is a pleasant 89 y.o. female in NAD. AAO x 3.  Vascular Capillary fill time to digits <3 seconds.  DP pulse(s) are faintly palpable b/l lower extremities. PT pulses nonpalpable b/l. Pedal hair absent b/l. Lower extremity skin temperature gradient warm to cool b/l. No pain with calf compression b/l. No cyanosis or clubbing noted. No ischemia nor gangrene noted b/l.   Neurologic Protective sensation intact 5/5 intact bilaterally with 10g monofilament b/l. Vibratory sensation intact b/l. No clonus b/l.   Dermatologic Pedal skin is thin, shiny and atrophic b/l.  No open wounds b/l lower extremities. No interdigital macerations b/l lower extremities. Toenails 2-5 b/l elongated, discolored, dystrophic, thickened, crumbly with subungual debris and tenderness to dorsal palpation. Pincer nail deformity bilateral great toes. No erythema, no edema, no drainage, no fluctuance. Nail border hypertrophy absent.  Sign(s) of infection: no clinical signs of infection noted on examination today.. No hyperkeratotic nor porokeratotic lesions present on today's visit.  Orthopedic: Normal muscle strength 5/5 to all lower extremity muscle groups bilaterally. Hammertoe deformity noted 2-5 b/l.  Utilizes walker for ambulation assistance.   Last HgA1c:      No data to display          Assessment:   1. Pain due to onychomycosis of toenail   2. Type II diabetes mellitus with peripheral circulatory disorder [facility])    Plan:  Patient was evaluated and treated and all questions answered. Consent given for treatment as described below: -Patient's family member present. All questions/concerns addressed on today's visit. -Patient has new pair of diabetic shoes and is pleased with them per Daughter's report. -Continue foot and shoe inspections daily. Monitor blood glucose per PCP/Endocrinologist's recommendations. -Mycotic toenails 1-5 bilaterally were debrided in length and girth with sterile nail nippers and dremel. Pinpoint bleeding of bilateral great toes addressed with Lumicain Hemostatic Solution, cleansed with alcohol. Triple antibiotic ointment applied. No further treatment required by patient/caregiver. -Patient/POA to call should there be question/concern in the interim.  Return in about 3 months (around 12/04/2023).  Freddie Breech, DPM      [place] LOCATION: [address] [place] [address]                   Office [phone]   [facility] LOCATION: [address] [place], [place] [address] Office [phone]

## 2023-10-15 ENCOUNTER — Emergency Department: Payer: 59

## 2023-10-15 ENCOUNTER — Emergency Department
Admission: EM | Admit: 2023-10-15 | Discharge: 2023-10-15 | Disposition: A | Discharge status: Home / Self Care | Payer: 59 | Attending: Emergency Medicine | Admitting: Emergency Medicine

## 2023-10-15 ENCOUNTER — Other Ambulatory Visit: Payer: Self-pay

## 2023-10-15 DIAGNOSIS — I10 Essential (primary) hypertension: Secondary | ICD-10-CM | POA: Insufficient documentation

## 2023-10-15 DIAGNOSIS — S82839A Other fracture of upper and lower end of unspecified fibula, initial encounter for closed fracture: Secondary | ICD-10-CM

## 2023-10-15 DIAGNOSIS — E119 Type 2 diabetes mellitus without complications: Secondary | ICD-10-CM | POA: Insufficient documentation

## 2023-10-15 DIAGNOSIS — F0390 Unspecified dementia without behavioral disturbance: Secondary | ICD-10-CM | POA: Insufficient documentation

## 2023-10-15 DIAGNOSIS — W19XXXA Unspecified fall, initial encounter: Secondary | ICD-10-CM | POA: Insufficient documentation

## 2023-10-15 DIAGNOSIS — S82831A Other fracture of upper and lower end of right fibula, initial encounter for closed fracture: Secondary | ICD-10-CM | POA: Insufficient documentation

## 2023-10-15 DIAGNOSIS — S8991XA Unspecified injury of right lower leg, initial encounter: Secondary | ICD-10-CM | POA: Diagnosis present

## 2023-10-15 MED ORDER — ACETAMINOPHEN 325 MG PO TABS
650.0000 mg | ORAL_TABLET | Freq: Once | ORAL | Status: AC
  Administered 2023-10-15: 650 mg via ORAL
  Filled 2023-10-15: qty 2

## 2023-10-15 NOTE — ED Provider Notes (Signed)
[facility] Provider Note    Event Date/Time   First MD Initiated Contact with Patient 10/15/23 [phone]     (approximate)   History   Rt Calf Pain   HPI Connie Frederick is a 89 y.o. female with history of dementia, DM2, HTN presenting today for right calf pain.  Patient is here with daughter.  They report sometime around 3 AM overnight that her legs got weak and she sat herself down.  She notes pain on the outside of her right ankle.  She reports that she did not completely fall and more sat herself down.  She denies head or neck injury.  She denies pain symptoms anywhere else on her body.  Denies any numbness or tingling.  Daughter reports that she has seen swelling to her right ankle for the past week.  She has still been walking on it with the use of a walker.     Physical Exam   Triage Vital Signs: ED Triage Vitals  Encounter Vitals Group     BP 10/15/23 0528 (!) 187/61     Systolic BP Percentile --      Diastolic BP Percentile --      Pulse Rate 10/15/23 0528 (!) 54     Resp 10/15/23 0528 18     Temp 10/15/23 0528 97.6 F (36.4 C)     Temp Source 10/15/23 0528 Oral     SpO2 10/15/23 0528 95 %     Weight 10/15/23 0524 160 lb 7.9 oz (72.8 kg)     Height 10/15/23 0524 5' (1.524 m)     Head Circumference --      Peak Flow --      Pain Score 10/15/23 0524 4     Pain Loc --      Pain Education --      Exclude from Growth Chart --     Most recent vital signs: Vitals:   10/15/23 0528  BP: (!) 187/61  Pulse: (!) 54  Resp: 18  Temp: 97.6 F (36.4 C)  SpO2: 95%   I have reviewed the vital signs. General:  Awake, alert, no acute distress. Head:  Normocephalic, Atraumatic. EENT:  PERRL, EOMI, Oral mucosa pink and moist, Neck is supple. Cardiovascular: Regular rate, 2+ distal pulses. Respiratory:  Normal respiratory effort, symmetrical expansion, no distress.   Extremities: Swelling noted to the distal right lower extremity without obvious  erythema.  Slight tenderness palpation to the lateral aspect of the ankle.  No tenderness palpation throughout the rest of bilateral upper and lower extremities. Neuro:  Alert and oriented.  Interacting appropriately.  Sensation equal and intact throughout bilateral upper and lower extremities. Skin:  Warm, dry, no rash.   Psych: Appropriate affect.    ED Results / Procedures / Treatments   Labs (all labs ordered are listed, but only abnormal results are displayed) Labs Reviewed - No data to display   EKG    RADIOLOGY Independently interpreted x-ray with evidence of nondisplaced distal fibula fracture   PROCEDURES:  Critical Care performed: No  Procedures   MEDICATIONS ORDERED IN ED: Medications  acetaminophen (TYLENOL) tablet 650 mg (650 mg Oral Given 10/15/23 0807)     IMPRESSION / MDM / ASSESSMENT AND PLAN / ED COURSE  I reviewed the triage vital signs and the nursing notes.  Differential diagnosis includes, but is not limited to, ankle sprain, fibular fracture, tibia fracture, DVT  Patient's presentation is most consistent with acute complicated illness / injury requiring diagnostic workup.  Patient is an 89 year old female presenting today for ground-level fall.  Reportedly having some right ankle pain although daughter has noted pain and swelling for several days at this point.  Has still been ambulating on it.  Ultrasound negative for DVT.  She does have a nondisplaced distal fibula fracture. Spoke with orthopedics who states that it looks stable. Recommends CAM boot.  Patient minimally ambulatory at baseline and already using walker.  She was able to stand with the use of a walker here at bedside with the cam boot in place.  Will discharge with minimal weightbearing to that right lower extremity until follow-up with podiatry.  Family agreeable with plan.     FINAL CLINICAL IMPRESSION(S) / ED DIAGNOSES   Final diagnoses:  Closed  fracture of distal end of fibula, unspecified fracture morphology, initial encounter     Rx / DC Orders   ED Discharge Orders          Ordered    AMB referral to orthopedics        10/15/23 0825             Note:  This document was prepared using Dragon voice recognition software and may include unintentional dictation errors.   Janith Lima, MD 10/15/23 [phone]

## 2023-10-15 NOTE — Discharge Instructions (Addendum)
Please keep the boot on at all times when up walking around.  This will help protect the fracture.  Please have her use the walker at all times and limit bearing weight through the right ankle is much as possible.  Please follow-up with podiatry.

## 2023-10-15 NOTE — ED Triage Notes (Addendum)
Pt arrived from home BIB [facility], daughter reports unwitnessed fall, found on the floor, pt does not remember the fall, not on thinners, no hematoma or laceration noted. Daughter  also reports pt has been complaining of right calf pain for over a week and bilateral feet/ankle swelling. Pt was seen for same in Nov, neg DVT study. Pt main concern is her right calf hurting. Pt is A&O at baseline, NAD noted.   177/76 77 HR 15 RR 95 % RA 98.1 Temp

## 2023-10-15 NOTE — ED Notes (Signed)
At this time, no verbal orders from Dr. York Cerise

## 2023-12-05 ENCOUNTER — Ambulatory Visit: Payer: 59 | Admitting: Podiatry

## 2023-12-05 ENCOUNTER — Encounter: Payer: Self-pay | Admitting: Podiatry

## 2023-12-05 VITALS — Ht 60.0 in | Wt 160.5 lb

## 2023-12-05 DIAGNOSIS — M79676 Pain in unspecified toe(s): Secondary | ICD-10-CM

## 2023-12-05 DIAGNOSIS — E1151 Type 2 diabetes mellitus with diabetic peripheral angiopathy without gangrene: Secondary | ICD-10-CM

## 2023-12-05 DIAGNOSIS — B351 Tinea unguium: Secondary | ICD-10-CM

## 2023-12-05 NOTE — Progress Notes (Signed)
  Subjective:  Patient ID: Connie Frederick, female    DOB: 02-14-1933,  MRN: [id number]  Connie Frederick presents to clinic today for at risk foot care. Pt has h/o NIDDM with PAD and painful, elongated thickened toenails x 10 which are symptomatic when wearing enclosed shoe gear. This interferes with his/her daily activities. Patient sustained a distal fibular fracture of the right lower extremity and is being treated by [facility]. She is wearing a walking boot.   Chief Complaint  Patient presents with   Nail Problem    Pt is here for [facility] PCP is Dr Allena Katz and LOV was in September.   New problem(s): None.   PCP is [facility].  No Known Allergies  Review of Systems: Negative except as noted in the HPI.  Objective: No changes noted in today's physical examination. There were no vitals filed for this visit. Connie Frederick is a pleasant 89 y.o. female WD, WN in NAD. AAO x 3.  Vascular Examination: CFT <3 seconds b/l. DP pulses faintly palpable b/l. PT pulses nonpalpable b/l. Digital hair absent. Skin temperature gradient warm to warm b/l. No pain with calf compression. No ischemia or gangrene. No cyanosis or clubbing noted b/l.    Neurological Examination: Sensation grossly intact b/l with 10 gram monofilament. Vibratory sensation intact b/l.   Dermatological Examination: edal skin is thin, shiny and atrophic b/l.  No open wounds b/l lower extremities. No interdigital macerations b/l lower extremities. Toenails 2-5 b/l elongated, discolored, dystrophic, thickened, crumbly with subungual debris and tenderness to dorsal palpation.   Pincer nail deformity bilateral great toes. No erythema, no edema, no drainage, no fluctuance. Nail border hypertrophy absent.  Sign(s) of infection: no clinical signs of infection noted on examination today.. No hyperkeratotic nor porokeratotic lesions present on today's visit.  Musculoskeletal Examination: Muscle strength 5/5 to  all lower extremity muscle groups bilaterally. Wearing a walking boot on the RLE for fibular fx. Hammertoe deformity noted 2-5 b/l.  Radiographs: None  Assessment/Plan: 1. Pain due to onychomycosis of toenail   2. Type II diabetes mellitus with peripheral circulatory disorder [facility])     Consent given for treatment. Diabetic foot examination performed.. All patient's and/or POA's questions/concerns addressed on today's visit. Mycotic toenails 1-5 debrided in length and girth without incident. Continue foot and shoe inspections daily. Monitor blood glucose per PCP/Endocrinologist's recommendations.Continue soft, supportive shoe gear daily. Report any pedal injuries to medical professional. Call office if there are any quesitons/concerns. -Patient/POA to call should there be question/concern in the interim.   Return in about 3 months (around 03/06/2024).  Freddie Breech, DPM      [place] LOCATION: [address] [place] [address]                   Office [phone]   [facility] LOCATION: [address] [place], [place] [address] Office [phone]

## 2023-12-11 ENCOUNTER — Encounter: Payer: Self-pay | Admitting: Podiatry

## 2024-03-09 ENCOUNTER — Encounter: Payer: Self-pay | Admitting: Podiatry

## 2024-03-09 ENCOUNTER — Ambulatory Visit (INDEPENDENT_AMBULATORY_CARE_PROVIDER_SITE_OTHER): Admitting: Podiatry

## 2024-03-09 VITALS — Ht 60.0 in | Wt 160.5 lb

## 2024-03-09 DIAGNOSIS — E1151 Type 2 diabetes mellitus with diabetic peripheral angiopathy without gangrene: Secondary | ICD-10-CM | POA: Diagnosis not present

## 2024-03-09 DIAGNOSIS — B351 Tinea unguium: Secondary | ICD-10-CM

## 2024-03-09 DIAGNOSIS — M79676 Pain in unspecified toe(s): Secondary | ICD-10-CM

## 2024-03-13 ENCOUNTER — Encounter: Payer: Self-pay | Admitting: Podiatry

## 2024-03-13 NOTE — Progress Notes (Signed)
  Subjective:  Patient ID: Connie Frederick, female    DOB: Feb 06, 1933,  MRN: [id number]  Connie Frederick presents to clinic today for at risk foot care. Pt has h/o NIDDM with PAD and painful thick toenails that are difficult to trim. Pain interferes with ambulation. Aggravating factors include wearing enclosed shoe gear. Pain is relieved with periodic professional debridement.  Chief Complaint  Patient presents with   Nail Problem    Pt is here for RFC    New problem(s): None.   PCP is [facility].  No Known Allergies  Review of Systems: Negative except as noted in the HPI.  Objective: No changes noted in today's physical examination. There were no vitals filed for this visit. Connie Frederick is a pleasant 89 y.o. female WD, WN in NAD. AAO x 3.  Vascular Examination: CFT less than 3 seconds. DP pulses 2/4 b/l. PT pulses 0/4  b/l. Digital hair absent. Skin temperature gradient warm to warn b/l. No ischemia or gangrene. No cyanosis or clubbing noted b/l. No edema noted b/l LE.   Neurological Examination: Sensation grossly intact b/l with 10 gram monofilament. Vibratory sensation intact b/l.   Dermatological Examination: Pedal skin thin, shiny and atrophic b/l. No open wounds. No interdigital macerations.   Toenails 1-5 b/l thick, discolored, elongated with subungual debris and pain on dorsal palpation.   No corns, calluses nor porokeratotic lesions noted.  Musculoskeletal Examination: Muscle strength 5/5 to all lower extremity muscle groups bilaterally. No pain, crepitus or joint limitation noted with ROM b/l LE. Hammertoe(s) 2-5 b/l.Connie Frederick Patient ambulates with walker assistance.  Radiographs: None  Last A1c:       No data to display          Assessment/Plan: 1. Pain due to onychomycosis of toenail   2. Type II diabetes mellitus with peripheral circulatory disorder [facility])     Patient was evaluated and treated. All patient's and/or POA's  questions/concerns addressed on today's visit. Toenails 1-5 debrided in length and girth without incident. Continue foot and shoe inspections daily. Monitor blood glucose per PCP/Endocrinologist's recommendations. Continue soft, supportive shoe gear daily. Report any pedal injuries to medical professional. Call office if there are any questions/concerns. -Patient/POA to call should there be question/concern in the interim.   No follow-ups on file.  Connie Frederick, DPM      [place] LOCATION: [address], [place] [address]                   Office [phone]   [facility] LOCATION: [address] [place], [place] [address] Office [phone]

## 2024-06-08 ENCOUNTER — Ambulatory Visit (INDEPENDENT_AMBULATORY_CARE_PROVIDER_SITE_OTHER): Admitting: Podiatry

## 2024-06-08 ENCOUNTER — Encounter: Payer: Self-pay | Admitting: Podiatry

## 2024-06-08 DIAGNOSIS — B351 Tinea unguium: Secondary | ICD-10-CM

## 2024-06-08 DIAGNOSIS — M79676 Pain in unspecified toe(s): Secondary | ICD-10-CM

## 2024-06-08 DIAGNOSIS — E119 Type 2 diabetes mellitus without complications: Secondary | ICD-10-CM

## 2024-06-08 DIAGNOSIS — M2041 Other hammer toe(s) (acquired), right foot: Secondary | ICD-10-CM

## 2024-06-08 DIAGNOSIS — M2042 Other hammer toe(s) (acquired), left foot: Secondary | ICD-10-CM | POA: Diagnosis not present

## 2024-06-08 DIAGNOSIS — E1151 Type 2 diabetes mellitus with diabetic peripheral angiopathy without gangrene: Secondary | ICD-10-CM | POA: Diagnosis not present

## 2024-06-11 NOTE — Progress Notes (Signed)
  Subjective:  Patient ID: Connie Frederick, female    DOB: 04/18/1933,  MRN: [id number]  Connie Frederick presents to clinic today for for annual diabetic foot examination and painful mycotic toenails of both feet that are difficult to trim. Pain interferes with daily activities and wearing enclosed shoe gear comfortably.  Chief Complaint  Patient presents with   RFc    Rm1 Routine foot care Dr/   New problem(s): None.   PCP is [facility].  No Known Allergies  Review of Systems: Negative except as noted in the HPI.  Objective: No changes noted in today's physical examination. There were no vitals filed for this visit. Connie Frederick is a pleasant 89 y.o. female WD, WN in NAD. AAO x 3.   Diabetic foot exam was performed with the following findings:   Vascular Examination: CFT less than 3 seconds. DP pulses palpable b/l. PT pulses nonpalpable b/l. Digital hair absent. Skin temperature gradient warm to warn b/l. No ischemia or gangrene. No cyanosis or clubbing noted b/l.    Neurological Examination: Sensation grossly intact b/l with 10 gram monofilament. Vibratory sensation intact b/l.   Dermatological Examination: Pedal skin thin, shiny and atrophic b/l. No open wounds. No interdigital macerations.   Toenails 1-5 b/l thick, discolored, elongated with subungual debris and pain on dorsal palpation.   No hyperkeratotic nor porokeratotic lesions.  Musculoskeletal Examination: Muscle strength 5/5 to all lower extremity muscle groups bilaterally. No pain, crepitus or joint limitation noted with ROM b/l LE. Hammertoe(s) 2-5 b/l.Connie Frederick Patient ambulates with walker assistance.  Radiographs: None    Assessment/Plan: 1. Pain due to onychomycosis of toenail   2. Acquired hammertoes of both feet   3. Type II diabetes mellitus with peripheral circulatory disorder (HCC)   4. Encounter for diabetic foot exam (HCC)   Diabetic foot examination performed today.  All  patient's and/or POA's questions/concerns addressed on today's visit. Mycotic toenails 1-5 debrided in length and girth without incident. Continue daily foot inspections and monitor blood glucose per PCP/Endocrinologist's recommendations. Continue soft, supportive shoe gear daily. Report any pedal injuries to medical professional. Call office if there are any questions/concerns. -Patient/POA to call should there be question/concern in the interim.   Return in about 3 months (around 09/07/2024).  Connie Frederick, DPM      [place] LOCATION: [address], [place] [address]                   Office [phone]   [facility] LOCATION: [address] [place], [place] [address] Office [phone]

## 2024-09-07 ENCOUNTER — Ambulatory Visit: Admitting: Podiatry

## 2024-09-07 DIAGNOSIS — E1151 Type 2 diabetes mellitus with diabetic peripheral angiopathy without gangrene: Secondary | ICD-10-CM

## 2024-09-07 DIAGNOSIS — M79676 Pain in unspecified toe(s): Secondary | ICD-10-CM

## 2024-09-07 DIAGNOSIS — B351 Tinea unguium: Secondary | ICD-10-CM

## 2024-09-11 ENCOUNTER — Encounter: Payer: Self-pay | Admitting: Podiatry

## 2024-09-11 NOTE — Progress Notes (Signed)
"  °  Subjective:  Patient ID: Connie Frederick, female    DOB: 1933/06/10,  MRN: [id number]  Connie Frederick presents to clinic today for at risk foot care. Pt has h/o NIDDM with PAD and painful thick toenails that are difficult to trim. Pain interferes with ambulation. Aggravating factors include wearing enclosed shoe gear. Pain is relieved with periodic professional debridement. Her daughter is present during today's visit. Chief Complaint  Patient presents with   Diabetes    A1c 6.1. She was seen at [facility] in Nov. She denies being diabetic   New problem(s): None.   PCP is [facility].  Allergies[1]  Review of Systems: Negative except as noted in the HPI.  Objective: No changes noted in today's physical examination. There were no vitals filed for this visit. [name] is a pleasant 89 y.o. female WD, WN in NAD. AAO x 3.  Vascular Examination: CFT less than 3 seconds. DP pulses palpable b/l. PT pulses nonpalpable b/l. Digital hair absent. Skin temperature gradient warm to warn b/l. No ischemia or gangrene. No cyanosis or clubbing noted b/l.    Neurological Examination: Sensation grossly intact b/l with 10 gram monofilament. Vibratory sensation intact b/l.   Dermatological Examination: Pedal skin thin, shiny and atrophic b/l. No open wounds. No interdigital macerations.   Toenails 1-5 b/l thick, discolored, elongated with subungual debris and pain on dorsal palpation.   No hyperkeratotic nor porokeratotic lesions.  Musculoskeletal Examination: Muscle strength 5/5 to all lower extremity muscle groups bilaterally. No pain, crepitus or joint limitation noted with ROM b/l LE. Hammertoe(s) 2-5 b/l.SABRA Patient ambulates with walker assistance.  Radiographs: None  Assessment/Plan: 1. Pain due to onychomycosis of toenail   2. Type II diabetes mellitus with peripheral circulatory disorder [facility])   Patient was evaluated and treated. All  patient's and/or POA's questions/concerns addressed on today's visit. Toenails 1-5 b/l debrided in length and girth without incident. Continue foot and shoe inspections daily. Monitor blood glucose per PCP/Endocrinologist's recommendations. Continue soft, supportive shoe gear daily. Report any pedal injuries to medical professional. Call office if there are any questions/concerns. -Patient/POA to call should there be question/concern in the interim.   Return in about 3 months (around 12/06/2024).  Delon LITTIE Merlin, DPM      [place] LOCATION: [address], [place] [address]                   Office [phone]   [facility] LOCATION: [address] [place], [place] [address] Office [phone]     [1] No Known Allergies  "

## 2024-12-07 ENCOUNTER — Ambulatory Visit: Admitting: Podiatry
